# Patient Record
Sex: Female | Born: 1970
Health system: Southern US, Community
[De-identification: ages and names within clinical notes are randomized; demographics above are authoritative.]

## PROBLEM LIST (undated history)

## (undated) DIAGNOSIS — I10 Essential (primary) hypertension: Secondary | ICD-10-CM

## (undated) DIAGNOSIS — E119 Type 2 diabetes mellitus without complications: Secondary | ICD-10-CM

## (undated) DIAGNOSIS — K219 Gastro-esophageal reflux disease without esophagitis: Secondary | ICD-10-CM

## (undated) DIAGNOSIS — E785 Hyperlipidemia, unspecified: Secondary | ICD-10-CM

## (undated) DIAGNOSIS — J189 Pneumonia, unspecified organism: Secondary | ICD-10-CM

## (undated) DIAGNOSIS — G4730 Sleep apnea, unspecified: Secondary | ICD-10-CM

## (undated) DIAGNOSIS — M1990 Unspecified osteoarthritis, unspecified site: Secondary | ICD-10-CM

## (undated) HISTORY — DX: Pneumonia, unspecified organism: J18.9

## (undated) HISTORY — DX: Hyperlipidemia, unspecified: E78.5

## (undated) HISTORY — DX: Essential (primary) hypertension: I10

---

## 2006-03-16 ENCOUNTER — Ambulatory Visit (HOSPITAL_COMMUNITY): Admission: RE | Admit: 2006-03-16 | Discharge: 2006-03-16 | Payer: Self-pay | Admitting: Family Medicine

## 2008-04-07 ENCOUNTER — Ambulatory Visit (HOSPITAL_COMMUNITY): Admission: RE | Admit: 2008-04-07 | Discharge: 2008-04-07 | Payer: Self-pay | Admitting: Family Medicine

## 2008-05-02 HISTORY — PX: ABDOMINAL HYSTERECTOMY: SHX81

## 2008-11-10 ENCOUNTER — Other Ambulatory Visit: Admission: RE | Admit: 2008-11-10 | Discharge: 2008-11-10 | Payer: Self-pay | Admitting: Obstetrics & Gynecology

## 2009-01-21 ENCOUNTER — Encounter: Payer: Self-pay | Admitting: Obstetrics & Gynecology

## 2009-01-21 ENCOUNTER — Inpatient Hospital Stay (HOSPITAL_COMMUNITY): Admission: RE | Admit: 2009-01-21 | Discharge: 2009-01-22 | Payer: Self-pay | Admitting: Obstetrics & Gynecology

## 2010-05-23 ENCOUNTER — Encounter: Payer: Self-pay | Admitting: Internal Medicine

## 2010-08-06 LAB — URINALYSIS, ROUTINE W REFLEX MICROSCOPIC
Ketones, ur: NEGATIVE mg/dL
Protein, ur: NEGATIVE mg/dL
Urobilinogen, UA: 0.2 mg/dL (ref 0.0–1.0)

## 2010-08-06 LAB — COMPREHENSIVE METABOLIC PANEL
AST: 38 U/L — ABNORMAL HIGH (ref 0–37)
Alkaline Phosphatase: 69 U/L (ref 39–117)
BUN: 5 mg/dL — ABNORMAL LOW (ref 6–23)
Calcium: 10 mg/dL (ref 8.4–10.5)
Chloride: 109 mEq/L (ref 96–112)
GFR calc Af Amer: >60 mL/min (ref >60)
Potassium: 4.4 mEq/L (ref 3.5–5.1)
Sodium: 139 mEq/L (ref 135–145)
Total Bilirubin: 0.7 mg/dL (ref 0.3–1.2)
Total Protein: 7.6 g/dL (ref 6.0–8.3)

## 2010-08-06 LAB — TYPE AND SCREEN: ABO/RH(D): A POS

## 2010-08-06 LAB — CBC
HCT: 38.3 % (ref 36.0–46.0)
Hemoglobin: 13.1 g/dL (ref 12.0–15.0)
MCHC: 34.1 g/dL (ref 30.0–36.0)
MCV: 94.6 fL (ref 78.0–100.0)
Platelets: 275 10*3/uL (ref 150–400)
RBC: 4.05 MIL/uL (ref 3.87–5.11)
RDW: 14.9 % (ref 11.5–15.5)
RDW: 15.7 % — ABNORMAL HIGH (ref 11.5–15.5)

## 2010-08-06 LAB — HCG, QUANTITATIVE, PREGNANCY: hCG, Beta Chain, Quant, S: <2 m[IU]/mL (ref <5)

## 2010-08-06 LAB — DIFFERENTIAL
Eosinophils Relative: 2 % (ref 0–5)
Lymphocytes Relative: 27 % (ref 12–46)
Lymphs Abs: 3 10*3/uL (ref 0.7–4.0)

## 2011-12-27 DIAGNOSIS — R079 Chest pain, unspecified: Secondary | ICD-10-CM

## 2014-10-07 ENCOUNTER — Ambulatory Visit (INDEPENDENT_AMBULATORY_CARE_PROVIDER_SITE_OTHER): Payer: Medicaid Other | Admitting: Nurse Practitioner

## 2014-10-07 ENCOUNTER — Other Ambulatory Visit: Payer: Self-pay

## 2014-10-07 ENCOUNTER — Encounter: Payer: Self-pay | Admitting: Nurse Practitioner

## 2014-10-07 ENCOUNTER — Encounter (INDEPENDENT_AMBULATORY_CARE_PROVIDER_SITE_OTHER): Payer: Self-pay

## 2014-10-07 VITALS — BP 104/72 | HR 69 | Temp 97.5°F | Ht 65.0 in | Wt 190.6 lb

## 2014-10-07 DIAGNOSIS — K611 Rectal abscess: Secondary | ICD-10-CM | POA: Insufficient documentation

## 2014-10-07 DIAGNOSIS — K6289 Other specified diseases of anus and rectum: Secondary | ICD-10-CM | POA: Diagnosis not present

## 2014-10-07 DIAGNOSIS — R198 Other specified symptoms and signs involving the digestive system and abdomen: Secondary | ICD-10-CM | POA: Diagnosis not present

## 2014-10-07 NOTE — Assessment & Plan Note (Signed)
Patient with a bulky/full sensation in her rectum which is worse when sitting down. She saw her primary care for her symptoms, including rectal pain and hematochezia over the past year. She is a high risk patient for colorectal cancer as her mother was diagnosed at age 43 and passed away at age 15. Per PCP notes he performed a digital rectal exam and felt a boggy rectal mass. No rectal exam performed today due to the need for colonoscopy and rectal exam at that time. Given her presentation high-risk situation we'll proceed with an urgent colonoscopy.  Proceed with colonoscopy with Dr. Oneida Alar in the near future. The risks, benefits, and alternatives have been discussed in detail with the patient. They state understanding and desire to proceed.   Patient is not on any anticoagulants, anxiolytics, chronic pain medications, or antidepressants. She has a history of binge drinking once a week however has not had any alcohol in the past few years. We'll provide for 25 mg Phenergan before the procedure.

## 2014-10-07 NOTE — Assessment & Plan Note (Signed)
High risk patient for colorectal cancer due to family history. Her mother was diagnosed with colon cancer age 43 and passed away from the disease at age 63. The patient has had rectal pain for about the past year along with rectal bleeding, although the bleeding has subsided the pain is persistent. Given her high-risk situation, in clinical presentation we will proceed with a diagnostic colonoscopy in this high risk patient.  Proceed with colonoscopy with Dr. Oneida Alar in the near future. The risks, benefits, and alternatives have been discussed in detail with the patient. They state understanding and desire to proceed.   Patient is not on any anticoagulants, anxiolytics, chronic pain medications, or antidepressants. She has a history of binge drinking once a week however has not had any alcohol in the past few years. We'll provide for 25 mg Phenergan before the procedure.

## 2014-10-07 NOTE — Progress Notes (Signed)
CC'ED TO PCP 

## 2014-10-07 NOTE — Progress Notes (Signed)
Primary Care Physician:  Marline Backbone, FNP Primary Gastroenterologist:  Dr. Oneida Alar  Chief Complaint  Patient presents with  . Rectal Pain    HPI:   44 year old female presents on referral from PCP for rectal pain with palpable mass, family history of colorectal cancer. PCP notes reviewed. Last PCP office visit 09/19/2014 with noted rectal pain not related to DM, pressure when sitting or lying down. Digital rectal exam with palpable tender boggy mass and no stool in the vault. Stool for occult blood was negative.  Today she states she has ahd symptoms for about a year and include rectal bleeding and pain. The bleeding has subsided but pain persists. Bleeding was only on toilet tissue when wiping after a bowel movement. Pain is constant but worse with sitting. Denies fever, chills, unintentional weight loss, melena. Admits some LLQ abdominal pain which feels crampy. Pain is transient and recurrent. Denies N/V. Denies any additional upper or lower GI symptoms.   Past Medical History  Diagnosis Date  . Hypertension     Past Surgical History  Procedure Laterality Date  . Abdominal hysterectomy  2010    for heavy bleeding    Current Outpatient Prescriptions  Medication Sig Dispense Refill  . atenolol (TENORMIN) 25 MG tablet Take by mouth daily.    . hydrochlorothiazide (HYDRODIURIL) 25 MG tablet Take 25 mg by mouth daily.    Marland Kitchen ibuprofen (ADVIL,MOTRIN) 200 MG tablet Take 200 mg by mouth daily.    . Simethicone (PHAZYME PO) Take 1 capsule by mouth daily.     No current facility-administered medications for this visit.    Allergies as of 10/07/2014  . (No Known Allergies)    Family History  Problem Relation Age of Onset  . Colon cancer Mother 53    passed away age 34  . Breast cancer Maternal Aunt     currently treating    History   Social History  . Marital Status: Single    Spouse Name: N/A  . Number of Children: N/A  . Years of Education: N/A   Occupational  History  . Not on file.   Social History Main Topics  . Smoking status: Former Smoker    Quit date: 05/02/2012  . Smokeless tobacco: Never Used  . Alcohol Use: 0.0 oz/week    0 Standard drinks or equivalent per week     Comment: Last drink 2012; used to drink a case of beer every Friday  . Drug Use: No  . Sexual Activity: Not on file   Other Topics Concern  . Not on file   Social History Narrative  . No narrative on file    Review of Systems: General: Negative for anorexia, weight loss, fever, chills, fatigue, weakness. Eyes: Negative for vision changes.  ENT: Negative for hoarseness, difficulty swallowing. CV: Negative for chest pain, angina, palpitations, peripheral edema.  Respiratory: Negative for dyspnea at rest, cough, wheezing.  GI: See history of present illness. MS: Negative for joint pain, low back pain.  Derm: Negative for rash or itching.  Neuro: Negative for weakness, memory loss, confusion.  Psych: Negative for anxiety, depression.  Endo: Negative for unusual weight change.  Heme: Negative for bruising or bleeding. Allergy: Negative for rash or hives.    Physical Exam: BP 104/72 mmHg  Pulse 69  Temp(Src) 97.5 F (36.4 C)  Ht 5\' 5"  (1.651 m)  Wt 190 lb 9.6 oz (86.456 kg)  BMI 31.72 kg/m2 General:   Alert and oriented. Pleasant and  cooperative. Well-nourished and well-developed.  Head:  Normocephalic and atraumatic. Eyes:  Without icterus, sclera clear and conjunctiva pink.  Ears:  Normal auditory acuity. Cardiovascular:  S1, S2 present without murmurs appreciated. Normal pulses noted. Extremities without clubbing or edema. Respiratory:  Clear to auscultation bilaterally. No wheezes, rales, or rhonchi. No distress.  Gastrointestinal:  +BS, soft, and non-distended. Mild LLQ TTP. No HSM noted. No guarding or rebound. No masses appreciated.  Rectal:  Deferred  Skin:  Intact without significant lesions or rashes. Neurologic:  Alert and oriented x4;   grossly normal neurologically. Psych:  Alert and cooperative. Normal mood and affect. Heme/Lymph/Immune: No excessive bruising noted.    10/07/2014 11:42 AM

## 2014-10-07 NOTE — Patient Instructions (Signed)
1. We will schedule your procedure (colonoscopy) for you 2. Further recommendations to be based on the results of your procedure. 

## 2014-10-14 ENCOUNTER — Ambulatory Visit (HOSPITAL_COMMUNITY)
Admission: RE | Admit: 2014-10-14 | Discharge: 2014-10-14 | Disposition: A | Discharge status: Home / Self Care | Payer: Medicaid Other | Source: Ambulatory Visit | Attending: Gastroenterology | Admitting: Gastroenterology

## 2014-10-14 ENCOUNTER — Encounter (HOSPITAL_COMMUNITY): Payer: Self-pay | Admitting: *Deleted

## 2014-10-14 ENCOUNTER — Encounter (HOSPITAL_COMMUNITY)
Admission: RE | Disposition: A | Discharge status: Home / Self Care | Payer: Self-pay | Source: Ambulatory Visit | Attending: Gastroenterology

## 2014-10-14 DIAGNOSIS — Z87891 Personal history of nicotine dependence: Secondary | ICD-10-CM | POA: Diagnosis not present

## 2014-10-14 DIAGNOSIS — Z79899 Other long term (current) drug therapy: Secondary | ICD-10-CM | POA: Diagnosis not present

## 2014-10-14 DIAGNOSIS — K621 Rectal polyp: Secondary | ICD-10-CM | POA: Insufficient documentation

## 2014-10-14 DIAGNOSIS — K921 Melena: Secondary | ICD-10-CM

## 2014-10-14 DIAGNOSIS — I10 Essential (primary) hypertension: Secondary | ICD-10-CM | POA: Diagnosis not present

## 2014-10-14 DIAGNOSIS — K6289 Other specified diseases of anus and rectum: Secondary | ICD-10-CM

## 2014-10-14 DIAGNOSIS — M13851 Other specified arthritis, right hip: Secondary | ICD-10-CM | POA: Diagnosis not present

## 2014-10-14 DIAGNOSIS — M13861 Other specified arthritis, right knee: Secondary | ICD-10-CM | POA: Diagnosis not present

## 2014-10-14 DIAGNOSIS — K648 Other hemorrhoids: Secondary | ICD-10-CM | POA: Diagnosis not present

## 2014-10-14 DIAGNOSIS — K644 Residual hemorrhoidal skin tags: Secondary | ICD-10-CM | POA: Insufficient documentation

## 2014-10-14 DIAGNOSIS — K5730 Diverticulosis of large intestine without perforation or abscess without bleeding: Secondary | ICD-10-CM | POA: Diagnosis not present

## 2014-10-14 DIAGNOSIS — Z791 Long term (current) use of non-steroidal anti-inflammatories (NSAID): Secondary | ICD-10-CM | POA: Diagnosis not present

## 2014-10-14 DIAGNOSIS — Z800 Family history of malignant neoplasm of digestive organs: Secondary | ICD-10-CM | POA: Diagnosis not present

## 2014-10-14 DIAGNOSIS — K625 Hemorrhage of anus and rectum: Secondary | ICD-10-CM | POA: Diagnosis present

## 2014-10-14 DIAGNOSIS — M13832 Other specified arthritis, left wrist: Secondary | ICD-10-CM | POA: Diagnosis not present

## 2014-10-14 DIAGNOSIS — K649 Unspecified hemorrhoids: Secondary | ICD-10-CM | POA: Diagnosis not present

## 2014-10-14 DIAGNOSIS — K219 Gastro-esophageal reflux disease without esophagitis: Secondary | ICD-10-CM | POA: Diagnosis not present

## 2014-10-14 DIAGNOSIS — M13831 Other specified arthritis, right wrist: Secondary | ICD-10-CM | POA: Diagnosis not present

## 2014-10-14 DIAGNOSIS — M6289 Other specified disorders of muscle: Secondary | ICD-10-CM | POA: Insufficient documentation

## 2014-10-14 HISTORY — DX: Unspecified osteoarthritis, unspecified site: M19.90

## 2014-10-14 HISTORY — PX: COLONOSCOPY: SHX5424

## 2014-10-14 HISTORY — DX: Gastro-esophageal reflux disease without esophagitis: K21.9

## 2014-10-14 SURGERY — COLONOSCOPY
Anesthesia: Moderate Sedation

## 2014-10-14 MED ORDER — MEPERIDINE HCL 100 MG/ML IJ SOLN
INTRAMUSCULAR | Status: DC | PRN
  Administered 2014-10-14 (×2): 25 mg via INTRAVENOUS

## 2014-10-14 MED ORDER — STERILE WATER FOR IRRIGATION IR SOLN
Status: DC | PRN
  Administered 2014-10-14: 13:00:00

## 2014-10-14 MED ORDER — MEPERIDINE HCL 100 MG/ML IJ SOLN
INTRAMUSCULAR | Status: AC
  Filled 2014-10-14: qty 2

## 2014-10-14 MED ORDER — MIDAZOLAM HCL 5 MG/5ML IJ SOLN
INTRAMUSCULAR | Status: DC | PRN
  Administered 2014-10-14: 1 mg via INTRAVENOUS
  Administered 2014-10-14: 2 mg via INTRAVENOUS

## 2014-10-14 MED ORDER — MIDAZOLAM HCL 5 MG/5ML IJ SOLN
INTRAMUSCULAR | Status: AC
  Filled 2014-10-14: qty 10

## 2014-10-14 MED ORDER — PROMETHAZINE HCL 25 MG/ML IJ SOLN
25.0000 mg | Freq: Once | INTRAMUSCULAR | Status: AC
  Administered 2014-10-14: 25 mg via INTRAVENOUS

## 2014-10-14 MED ORDER — SODIUM CHLORIDE 0.9 % IJ SOLN
INTRAMUSCULAR | Status: AC
  Filled 2014-10-14: qty 3

## 2014-10-14 MED ORDER — SODIUM CHLORIDE 0.9 % IV SOLN
INTRAVENOUS | Status: DC
  Administered 2014-10-14: 1000 mL via INTRAVENOUS

## 2014-10-14 MED ORDER — PROMETHAZINE HCL 25 MG/ML IJ SOLN
INTRAMUSCULAR | Status: AC
  Filled 2014-10-14: qty 1

## 2014-10-14 NOTE — Discharge Instructions (Signed)
You had 1 small polyp removed FROM YOUR RECTUM. You have SMALL internal AND LARGE EXTERNAL hemorrhoids.   YOU NEED TO SEE A SURGEON TO DISCUSS HEMORRHOID SURGERY.  FOLLOW A HIGH FIBER DIET. AVOID ITEMS THAT CAUSE BLOATING & GAS. SEE INFO BELOW.  YOUR BIOPSY RESULTS WILL BE AVAILABLE IN MY CHART AFTER JUN 16 AND MY OFFICE WILL CONTACT YOU IN 10-14 DAYS WITH YOUR RESULTS.   Next colonoscopy in 5-10 years.    Colonoscopy Care After Read the instructions outlined below and refer to this sheet in the next week. These discharge instructions provide you with general information on caring for yourself after you leave the hospital. While your treatment has been planned according to the most current medical practices available, unavoidable complications occasionally occur. If you have any problems or questions after discharge, call DR. Chuong Casebeer, [phone].  ACTIVITY  You may resume your regular activity, but move at a slower pace for the next 24 hours.   Take frequent rest periods for the next 24 hours.   Walking will help get rid of the air and reduce the bloated feeling in your belly (abdomen).   No driving for 24 hours (because of the medicine (anesthesia) used during the test).   You may shower.   Do not sign any important legal documents or operate any machinery for 24 hours (because of the anesthesia used during the test).    NUTRITION  Drink plenty of fluids.   You may resume your normal diet as instructed by your doctor.   Begin with a light meal and progress to your normal diet. Heavy or fried foods are harder to digest and may make you feel sick to your stomach (nauseated).   Avoid alcoholic beverages for 24 hours or as instructed.    MEDICATIONS  You may resume your normal medications.   WHAT YOU CAN EXPECT TODAY  Some feelings of bloating in the abdomen.   Passage of more gas than usual.   Spotting of blood in your stool or on the toilet paper  .  IF YOU HAD  POLYPS REMOVED DURING THE COLONOSCOPY:  Eat a soft diet IF YOU HAVE NAUSEA, BLOATING, ABDOMINAL PAIN, OR VOMITING.    FINDING OUT THE RESULTS OF YOUR TEST Not all test results are available during your visit. DR. Oneida Alar WILL CALL YOU WITHIN 14 DAYS OF YOUR PROCEDUE WITH YOUR RESULTS. Do not assume everything is normal if you have not heard from DR. Kyce Ging, CALL HER OFFICE AT [phone].  SEEK IMMEDIATE MEDICAL ATTENTION AND CALL THE OFFICE: [phone] IF:  You have more than a spotting of blood in your stool.   Your belly is swollen (abdominal distention).   You are nauseated or vomiting.   You have a temperature over 101F.   You have abdominal pain or discomfort that is severe or gets worse throughout the day.   High-Fiber Diet A high-fiber diet changes your normal diet to include more whole grains, legumes, fruits, and vegetables. Changes in the diet involve replacing refined carbohydrates with unrefined foods. The calorie level of the diet is essentially unchanged. The Dietary Reference Intake (recommended amount) for adult males is 38 grams per day. For adult females, it is 25 grams per day. Pregnant and lactating women should consume 28 grams of fiber per day. Fiber is the intact part of a plant that is not broken down during digestion. Functional fiber is fiber that has been isolated from the plant to provide a beneficial effect in the  body. PURPOSE  Increase stool bulk.   Ease and regulate bowel movements.   Lower cholesterol.  REDUCE YOUR RISK OF COLON CANCER  INDICATIONS THAT YOU NEED MORE FIBER  Constipation and hemorrhoids.   Uncomplicated diverticulosis (intestine condition) and irritable bowel syndrome.   Weight management.   As a protective measure against hardening of the arteries (atherosclerosis), diabetes, and cancer.   GUIDELINES FOR INCREASING FIBER IN THE DIET  Start adding fiber to the diet slowly. A gradual increase of about 5 more grams (2  slices of whole-wheat bread, 2 servings of most fruits or vegetables, or 1 bowl of high-fiber cereal) per day is best. Too rapid an increase in fiber may result in constipation, flatulence, and bloating.   Drink enough water and fluids to keep your urine clear or pale yellow. Water, juice, or caffeine-free drinks are recommended. Not drinking enough fluid may cause constipation.   Eat a variety of high-fiber foods rather than one type of fiber.   Try to increase your intake of fiber through using high-fiber foods rather than fiber pills or supplements that contain small amounts of fiber.   The goal is to change the types of food eaten. Do not supplement your present diet with high-fiber foods, but replace foods in your present diet.   INCLUDE A VARIETY OF FIBER SOURCES  Replace refined and processed grains with whole grains, canned fruits with fresh fruits, and incorporate other fiber sources. White rice, white breads, and most bakery goods contain little or no fiber.   Brown whole-grain rice, buckwheat oats, and many fruits and vegetables are all good sources of fiber. These include: broccoli, Brussels sprouts, cabbage, cauliflower, beets, sweet potatoes, white potatoes (skin on), carrots, tomatoes, eggplant, squash, berries, fresh fruits, and dried fruits.   Cereals appear to be the richest source of fiber. Cereal fiber is found in whole grains and bran. Bran is the fiber-rich outer coat of cereal grain, which is largely removed in refining. In whole-grain cereals, the bran remains. In breakfast cereals, the largest amount of fiber is found in those with "bran" in their names. The fiber content is sometimes indicated on the label.   You may need to include additional fruits and vegetables each day.   In baking, for 1 cup white flour, you may use the following substitutions:   1 cup whole-wheat flour minus 2 tablespoons.   1/2 cup white flour plus 1/2 cup whole-wheat flour.   Polyps, Colon   A polyp is extra tissue that grows inside your body. Colon polyps grow in the large intestine. The large intestine, also called the colon, is part of your digestive system. It is a long, hollow tube at the end of your digestive tract where your body makes and stores stool. Most polyps are not dangerous. They are benign. This means they are not cancerous. But over time, some types of polyps can turn into cancer. Polyps that are smaller than a pea are usually not harmful. But larger polyps could someday become or may already be cancerous. To be safe, doctors remove all polyps and test them.    PREVENTION There is not one sure way to prevent polyps. You might be able to lower your risk of getting them if you:  Eat more fruits and vegetables and less fatty food.   Do not smoke.   Avoid alcohol.   Exercise every day.   Lose weight if you are overweight.   Eating more calcium and folate can also lower your  risk of getting polyps. Some foods that are rich in calcium are milk, cheese, and broccoli. Some foods that are rich in folate are chickpeas, kidney beans, and spinach.   Hemorrhoids Hemorrhoids are dilated (enlarged) veins around the rectum. Sometimes clots will form in the veins. This makes them swollen and painful. These are called thrombosed hemorrhoids. Causes of hemorrhoids include:  Constipation.   Straining to have a bowel movement.   HEAVY LIFTING HOME CARE INSTRUCTIONS  Eat a well balanced diet and drink 6 to 8 glasses of water every day to avoid constipation. You may also use a bulk laxative.   Avoid straining to have bowel movements.   Keep anal area dry and clean.   Do not use a donut shaped pillow or sit on the toilet for long periods. This increases blood pooling and pain.   Move your bowels when your body has the urge; this will require less straining and will decrease pain and pressure.

## 2014-10-14 NOTE — Op Note (Signed)
[facility] [address] [place], [address]   COLONOSCOPY PROCEDURE REPORT  PATIENT: Teresa Russell, Teresa Russell  MR#: [id number] BIRTHDATE: July 03, 1970 , 43  yrs. old GENDER: female ENDOSCOPIST: Danie Binder, MD REFERRED BY: PROCEDURE DATE:  October 30, 2014 PROCEDURE:   Colonoscopy with cold biopsy polypectomy INDICATIONS:anal bleeding. MEDICATIONS: Promethazine (Phenergan) 25 mg IV, Demerol 50 mg IV, and Versed 3 mg IV  DESCRIPTION OF PROCEDURE:    Physical exam was performed.  Informed consent was obtained from the patient after explaining the benefits, risks, and alternatives to procedure.  The patient was connected to monitor and placed in left lateral position. Continuous oxygen was provided by nasal cannula and IV medicine administered through an indwelling cannula.  After administration of sedation and rectal exam, the patients rectum was intubated and the EC-3890Li ([id number])  colonoscope was advanced under direct visualization to the ileum.  The scope was removed slowly by carefully examining the color, texture, anatomy, and integrity mucosa on the way out.  The patient was recovered in endoscopy and discharged home in satisfactory condition. Estimated blood loss is zero unless otherwise noted in this procedure report.    COLON FINDINGS: The examined terminal ileum appeared to be normal. , The colonic mucosa appeared normal.  , The colon was redundant. , There was mild diverticulosis noted in the sigmoid colon with associated muscular hypertrophy.  , Small internal hemorrhoids were found.  , and Large external hemorrhoids were found.    ONE 3 MM SESSILE RECTAL POLYP REMOVED VIA COLD FORCEPS.  PREP QUALITY: excellent.  CECAL W/D TIME: 10       minutes COMPLICATIONS: None  ENDOSCOPIC IMPRESSION: 1.   RECTAL PAIN/BLEEDING DUE TO HEMORRHOIDS 2.   MILD SIGMOID COLON DIVERTICULOSIS 3.   The LEFT colon IS SLIGHTLY redundant 4.   Small internal hemorrhoids 5.    Large external hemorrhoids  RECOMMENDATIONS: SEE A SURGEON TO DISCUSS HEMORRHOID SURGERY. FOLLOW A HIGH FIBER DIET.  AVOID ITEMS THAT CAUSE BLOATING & GAS. AWAIT BIOPSY RESULTS. Next colonoscopy in 5-10 years.   _______________________________ eSignedDanie Binder, MD 2014-10-30 1:13 PM   CPT CODES: ICD CODES:  The ICD and CPT codes recommended by this software are interpretations from the data that the clinical staff has captured with the software.  The verification of the translation of this report to the ICD and CPT codes and modifiers is the sole responsibility of the health care institution and practicing physician where this report was generated.  [employer] will not be held responsible for the validity of the ICD and CPT codes included on this report.  AMA assumes no liability for data contained or not contained herein. CPT is a Designer, television/film set of the [employer].

## 2014-10-14 NOTE — Progress Notes (Signed)
REVIEWED-NO ADDITIONAL RECOMMENDATIONS. 

## 2014-10-14 NOTE — H&P (Signed)
  Primary Care Physician:  Edwinna Areola Deliah Goody, FNP Primary Gastroenterologist:  Dr. Oneida Alar  Pre-Procedure History & Physical: HPI:  Teresa Russell is a 44 y.o. female here for RECTAL BLEEDING/PAIN.  Past Medical History  Diagnosis Date  . Hypertension   . GERD (gastroesophageal reflux disease)   . Arthritis     wrists and right hip, right knee    Past Surgical History  Procedure Laterality Date  . Abdominal hysterectomy  2010    for heavy bleeding    Prior to Admission medications   Medication Sig Start Date End Date Taking? Authorizing Provider  atenolol (TENORMIN) 50 MG tablet Take 50 mg by mouth daily.   Yes Historical Provider, MD  hydrochlorothiazide (HYDRODIURIL) 25 MG tablet Take 25 mg by mouth daily.   Yes Historical Provider, MD  ibuprofen (ADVIL,MOTRIN) 200 MG tablet Take 400 mg by mouth daily.    Yes Historical Provider, MD  Multiple Vitamins-Minerals (EMERGEN-C VITAMIN C PO) Take 1 packet by mouth daily.   Yes Historical Provider, MD  Simethicone (PHAZYME PO) Take 1 capsule by mouth daily.   Yes Historical Provider, MD    Allergies as of 10/07/2014  . (No Known Allergies)    Family History  Problem Relation Age of Onset  . Colon cancer Mother 106    passed away age 55  . Breast cancer Maternal Aunt     currently treating    History   Social History  . Marital Status: Single    Spouse Name: N/A  . Number of Children: N/A  . Years of Education: N/A   Occupational History  . Not on file.   Social History Main Topics  . Smoking status: Former Smoker    Quit date: 05/02/2012  . Smokeless tobacco: Never Used  . Alcohol Use: 0.0 oz/week    0 Standard drinks or equivalent per week     Comment: Last drink 2012; used to drink a case of beer every Friday  . Drug Use: No  . Sexual Activity: Yes   Other Topics Concern  . Not on file   Social History Narrative    Review of Systems: See HPI, otherwise negative ROS   Physical Exam: BP 121/71 mmHg   Pulse 72  Temp(Src) 98.3 F (36.8 C) (Oral)  Resp 18  Ht 5\' 5"  (1.651 m)  Wt 190 lb (86.183 kg)  BMI 31.62 kg/m2  SpO2 100% General:   Alert,  pleasant and cooperative in NAD Head:  Normocephalic and atraumatic. Neck:  Supple; Lungs:  Clear throughout to auscultation.    Heart:  Regular rate and rhythm. Abdomen:  Soft, nontender and nondistended. Normal bowel sounds, without guarding, and without rebound.   Neurologic:  Alert and  oriented x4;  grossly normal neurologically.  Impression/Plan:    PLAN:  1. TCS TODAY

## 2014-10-16 ENCOUNTER — Encounter (HOSPITAL_COMMUNITY): Payer: Self-pay | Admitting: Gastroenterology

## 2014-10-27 ENCOUNTER — Telehealth: Payer: Self-pay | Admitting: Gastroenterology

## 2014-10-27 NOTE — Telephone Encounter (Signed)
Please call pt. She had HYPERPLASTIC POLYPS removed.   SEE A SURGEON TO DISCUSS HEMORRHOID SURGERY.  FOLLOW A HIGH FIBER DIET. AVOID ITEMS THAT CAUSE BLOATING & GAS.   Next colonoscopy in 5 years BECAUSE HER MOTHER HAD COLON CANCER.

## 2014-10-28 NOTE — Telephone Encounter (Signed)
Pt is aware of results. She will find her own surgeon and let us know she we can sent them any records.

## 2014-10-29 NOTE — Telephone Encounter (Signed)
Pt is on recall list for TCS 5 years

## 2017-03-07 ENCOUNTER — Other Ambulatory Visit (HOSPITAL_COMMUNITY): Payer: Self-pay | Admitting: *Deleted

## 2017-03-07 DIAGNOSIS — Z1239 Encounter for other screening for malignant neoplasm of breast: Secondary | ICD-10-CM

## 2017-03-22 ENCOUNTER — Ambulatory Visit (HOSPITAL_COMMUNITY): Payer: Self-pay

## 2018-06-05 ENCOUNTER — Other Ambulatory Visit (HOSPITAL_COMMUNITY): Payer: Self-pay | Admitting: *Deleted

## 2018-06-05 DIAGNOSIS — Z1231 Encounter for screening mammogram for malignant neoplasm of breast: Secondary | ICD-10-CM

## 2018-11-29 ENCOUNTER — Other Ambulatory Visit: Payer: Self-pay

## 2018-11-29 DIAGNOSIS — Z20822 Contact with and (suspected) exposure to covid-19: Secondary | ICD-10-CM

## 2018-12-02 LAB — NOVEL CORONAVIRUS, NAA: SARS-CoV-2, NAA: NOT DETECTED

## 2018-12-06 ENCOUNTER — Telehealth: Payer: Self-pay | Admitting: Family

## 2018-12-06 NOTE — Telephone Encounter (Signed)
Pt returning call for covid results; negative. Pt verbalizes understanding. 

## 2019-02-04 ENCOUNTER — Emergency Department (HOSPITAL_COMMUNITY): Payer: Self-pay

## 2019-02-04 ENCOUNTER — Other Ambulatory Visit: Payer: Self-pay

## 2019-02-04 ENCOUNTER — Encounter (HOSPITAL_COMMUNITY): Payer: Self-pay

## 2019-02-04 ENCOUNTER — Emergency Department (HOSPITAL_COMMUNITY)
Admission: EM | Admit: 2019-02-04 | Discharge: 2019-02-04 | Disposition: A | Discharge status: Home / Self Care | Payer: Self-pay | Attending: Emergency Medicine | Admitting: Emergency Medicine

## 2019-02-04 DIAGNOSIS — R079 Chest pain, unspecified: Secondary | ICD-10-CM

## 2019-02-04 DIAGNOSIS — Z87891 Personal history of nicotine dependence: Secondary | ICD-10-CM | POA: Insufficient documentation

## 2019-02-04 DIAGNOSIS — Z79899 Other long term (current) drug therapy: Secondary | ICD-10-CM | POA: Insufficient documentation

## 2019-02-04 DIAGNOSIS — R0789 Other chest pain: Secondary | ICD-10-CM | POA: Insufficient documentation

## 2019-02-04 DIAGNOSIS — I10 Essential (primary) hypertension: Secondary | ICD-10-CM | POA: Insufficient documentation

## 2019-02-04 LAB — CBC
HCT: 41.1 % (ref 36.0–46.0)
Hemoglobin: 14.4 g/dL (ref 12.0–15.0)
MCH: 29.9 pg (ref 26.0–34.0)
MCHC: 35 g/dL (ref 30.0–36.0)
MCV: 85.4 fL (ref 80.0–100.0)
Platelets: 344 10*3/uL (ref 150–400)
RBC: 4.81 MIL/uL (ref 3.87–5.11)
RDW: 13.1 % (ref 11.5–15.5)
WBC: 7.1 10*3/uL (ref 4.0–10.5)
nRBC: 0 % (ref 0.0–0.2)

## 2019-02-04 LAB — BASIC METABOLIC PANEL
Anion gap: 12 (ref 5–15)
BUN: 12 mg/dL (ref 6–20)
CO2: 28 mmol/L (ref 22–32)
Calcium: 10.3 mg/dL (ref 8.9–10.3)
Chloride: 99 mmol/L (ref 98–111)
Creatinine, Ser: 0.57 mg/dL (ref 0.44–1.00)
GFR calc Af Amer: >60 mL/min (ref >60)
GFR calc non Af Amer: >60 mL/min (ref >60)
Glucose, Bld: 121 mg/dL — ABNORMAL HIGH (ref 70–99)
Potassium: 3 mmol/L — ABNORMAL LOW (ref 3.5–5.1)
Sodium: 139 mmol/L (ref 135–145)

## 2019-02-04 LAB — TROPONIN I (HIGH SENSITIVITY)
Troponin I (High Sensitivity): <2 ng/L (ref <18)
Troponin I (High Sensitivity): <2 ng/L (ref <18)

## 2019-02-04 MED ORDER — POTASSIUM CHLORIDE CRYS ER 20 MEQ PO TBCR
40.0000 meq | EXTENDED_RELEASE_TABLET | Freq: Once | ORAL | Status: AC
  Administered 2019-02-04: 40 meq via ORAL
  Filled 2019-02-04: qty 2

## 2019-02-04 MED ORDER — OMEPRAZOLE 20 MG PO CPDR: 20.0000 mg | DELAYED_RELEASE_CAPSULE | Freq: Every day | ORAL | 0 refills | Status: DC

## 2019-02-04 MED ORDER — SODIUM CHLORIDE 0.9% FLUSH: 3.0000 mL | Freq: Once | INTRAVENOUS | Status: DC

## 2019-02-04 MED ORDER — SUCRALFATE 1 GM/10ML PO SUSP: 1.0000 g | Freq: Three times a day (TID) | ORAL | 0 refills | Status: DC

## 2019-02-04 MED ORDER — POTASSIUM CHLORIDE CRYS ER 20 MEQ PO TBCR: 20.0000 meq | EXTENDED_RELEASE_TABLET | Freq: Every day | ORAL | 0 refills | Status: DC

## 2019-02-04 NOTE — Discharge Instructions (Addendum)
You were seen in the emergency department today for chest pain. Your work-up in the emergency department has been overall reassuring. Your labs have been fairly normal and or similar to previous blood work you have had done with the exception that your potassium was low, we have given you a prescription for a potassium supplement as well as information to include this in your diet.. Your EKG and the enzyme we use to check your heart did not show an acute heart attack at this time. Your chest x-ray was normal.   We are sending you home with prescriptions to help with possible reflux including omeprazole to take daily in the morning prior to breakfast and Carafate to take prior to meals and prior to bedtime.  Please try these with diet recommendations.  We have prescribed you new medication(s) today. Discuss the medications prescribed today with your pharmacist as they can have adverse effects and interactions with your other medicines including over the counter and prescribed medications. Seek medical evaluation if you start to experience new or abnormal symptoms after taking one of these medicines, seek care immediately if you start to experience difficulty breathing, feeling of your throat closing, facial swelling, or rash as these could be indications of a more serious allergic reaction   We would like you to follow up closely with your primary care provider and/or the cardiologist provided in your discharge instructions within 1-3 days.  Please have your potassium rechecked by your primary care provider as well.  Return to the ER immediately should you experience any new or worsening symptoms including but not limited to return of pain, worsened pain, vomiting, shortness of breath, dizziness, lightheadedness, passing out, or any other concerns that you may have.

## 2019-02-04 NOTE — ED Triage Notes (Signed)
Pt presents to ED with complaints of generalized chest pain since yesterday. Pt also c/o dizziness.

## 2019-02-04 NOTE — ED Provider Notes (Addendum)
[facility] EMERGENCY DEPARTMENT Provider Note   CSN: [id number] Arrival date & time: 02/04/19  1539     History   Chief Complaint Chief Complaint  Patient presents with  . Chest Pain    HPI Teresa Russell is a 48 y.o. female with a history of GERD, hypertension, and prior abdominal hysterectomy who presents to the emergency department with complaints of intermittent chest pain since yesterday.  Patient states that shortly after eating she developed generalized chest discomfort described as sharp/tightness.  Symptoms seem to be aggravated by swallowing and eating sometimes, no specific alleviating factors.  Current symptoms are mild.  No intervention prior to arrival.  She felt a bit lightheaded with the discomfort yesterday but not currently.  She called her PCP to discuss possibly prescribing something for acid reflux and ultimately was told to come to the ED. Denies nausea, vomiting, diaphoresis, dyspnea, syncope, dizziness like the room spinning, leg pain/swelling, hemoptysis, recent surgery/trauma, recent long travel, hormone use, personal hx of cancer, or hx of DVT/PE. Denies early family hx of CAD.   HPI  Past Medical History:  Diagnosis Date  . Arthritis    wrists and right hip, right knee  . GERD (gastroesophageal reflux disease)   . Hypertension     Patient Active Problem List   Diagnosis Date Noted  . Rectal pain 10/07/2014  . Rectal mass 10/07/2014    Past Surgical History:  Procedure Laterality Date  . ABDOMINAL HYSTERECTOMY  2010   for heavy bleeding  . COLONOSCOPY N/A 10/14/2014   Procedure: COLONOSCOPY;  Surgeon: Danie Binder, MD;  Location: [facility];  Service: Endoscopy;  Laterality: N/A;  115 - moved to 11:15 - office notified pt     OB History   No obstetric history on file.      Home Medications    Prior to Admission medications   Medication Sig Start Date End Date Taking? Authorizing Provider  atenolol (TENORMIN) 50 MG tablet Take 50 mg  by mouth daily.    [provider]  hydrochlorothiazide (HYDRODIURIL) 25 MG tablet Take 25 mg by mouth daily.    [provider]  ibuprofen (ADVIL,MOTRIN) 200 MG tablet Take 400 mg by mouth daily.     [provider]  Multiple Vitamins-Minerals (EMERGEN-C VITAMIN C PO) Take 1 packet by mouth daily.    [provider]  Simethicone (PHAZYME PO) Take 1 capsule by mouth daily.    [provider]    Family History Family History  Problem Relation Age of Onset  . Colon cancer Mother 59       passed away age 69  . Breast cancer Maternal Aunt        currently treating    Social History Social History   Tobacco Use  . Smoking status: Former Smoker    Quit date: 05/02/2012    Years since quitting: 6.7  . Smokeless tobacco: Never Used  Substance Use Topics  . Alcohol use: Yes    Alcohol/week: 0.0 standard drinks    Comment: Last drink 2012; used to drink a case of beer every Friday  . Drug use: No     Allergies   Wellbutrin [bupropion]   Review of Systems Review of Systems  Constitutional: Negative for chills and fever.  HENT: Negative for congestion, ear pain and sore throat.   Respiratory: Negative for cough and shortness of breath.   Cardiovascular: Positive for chest pain. Negative for palpitations and leg swelling.  Gastrointestinal:  Negative for abdominal pain, nausea and vomiting.  Neurological: Positive for light-headedness (resolved @ present). Negative for dizziness, syncope, weakness, numbness and headaches.  All other systems reviewed and are negative.    Physical Exam Updated Vital Signs BP 123/90 (BP Location: Right Arm)   Pulse 77   Temp 98.1 F (36.7 C) (Oral)   Resp 18   Ht 5' 5.5" (1.664 m)   Wt 88.5 kg   SpO2 99%   BMI 31.96 kg/m   Physical Exam Vitals signs and nursing note reviewed.  Constitutional:      General: She is not in acute distress.    Appearance: She is well-developed. She is not  toxic-appearing.  HENT:     Head: Normocephalic and atraumatic.  Eyes:     General:        Right eye: No discharge.        Left eye: No discharge.     Conjunctiva/sclera: Conjunctivae normal.  Neck:     Musculoskeletal: Neck supple.  Cardiovascular:     Rate and Rhythm: Normal rate and regular rhythm.  Pulmonary:     Effort: Pulmonary effort is normal. No respiratory distress.     Breath sounds: Normal breath sounds. No wheezing, rhonchi or rales.  Chest:     Chest wall: Tenderness (mild anterior chest wall) present.  Abdominal:     General: There is no distension.     Palpations: Abdomen is soft.     Tenderness: There is no abdominal tenderness. There is no guarding or rebound. Negative signs include Murphy's sign and McBurney's sign.  Skin:    General: Skin is warm and dry.     Findings: No rash.  Neurological:     Mental Status: She is alert.     Comments: Clear speech.   Psychiatric:        Behavior: Behavior normal.    ED Treatments / Results  Labs (all labs ordered are listed, but only abnormal results are displayed) Labs Reviewed  BASIC METABOLIC PANEL - Abnormal; Notable for the following components:      Result Value   Potassium 3.0 (*)    Glucose, Bld 121 (*)    All other components within normal limits  CBC  TROPONIN I (HIGH SENSITIVITY)  TROPONIN I (HIGH SENSITIVITY)    EKG EKG Interpretation  Date/Time:  Monday February 04 2019 16:00:31 EDT Ventricular Rate:  75 PR Interval:  162 QRS Duration: 86 QT Interval:  400 QTC Calculation: 446 R Axis:   20 Text Interpretation:  Sinus rhythm with marked sinus arrhythmia Otherwise normal ECG Confirmed by Gerlene Fee [phone]) on 02/04/2019 9:17:47 PM   Radiology Dg Chest 2 View  Result Date: 02/04/2019 CLINICAL DATA:  Chest pain EXAM: CHEST - 2 VIEW COMPARISON:  03/30/2017 FINDINGS: No focal opacity or pleural effusion. Stable cardiomediastinal silhouette with right-sided aortic arch. No pneumothorax.  IMPRESSION: No active cardiopulmonary disease. Electronically Signed   By: Donavan Foil M.D.   On: 02/04/2019 19:19    Procedures Procedures (including critical care time)  Medications Ordered in ED Medications  sodium chloride flush (NS) 0.9 % injection 3 mL (3 mLs Intravenous Not Given 02/04/19 2040)  potassium chloride SA (KLOR-CON) CR tablet 40 mEq (40 mEq Oral Given 02/04/19 2108)     Initial Impression / Assessment and Plan / ED Course  I have reviewed the triage vital signs and the nursing notes.  Pertinent labs & imaging results that were available during my care of the patient  were reviewed by me and considered in my medical decision making (see chart for details).    Patient presents to the emergency department with complaints of intermittent chest pain since yesterday. Patient nontoxic appearing, in no apparent distress, vitals without significant abnormality, BP mildly elevated- doubt HTN emergency. Fairly benign physical exam, some discomfort w/ anterior chest wall palpation. DDX: ACS, pulmonary embolism, dissection, pneumothorax,pneumonia,, anemia,  MSK, GERD, anxiety. Work-up in the ER reviewed:  CBC: No anemia or leukocytosis BMP: Hypokalemia and mild hyperglycemia otherwise unremarkable.  Troponin: < 2 x 2.  EKG: No STEMI CXR:  Negative, without infiltrate, effusion, pneumothorax, or fracture/dislocation.   Heart pathway score 2, EKG without obvious ischemia, delta troponin negative, doubt ACS. Patient is low risk wells, PERC negative, doubt pulmonary embolism. Pain is not a tearing sensation, symmetric pulses, no widening of mediastinum on CXR, doubt dissection. Abdomen nontender w/o peritoneal signs- do not suspect acuter surgical abdominal process. Unclear definitive etiology- possibly GERD, will trial PPI & carafate with diet recommendations. Will given potassium supplement- suspect secondary to hydrochlorothiazide use without potassium supplementation.  Patient has  appeared hemodynamically stable throughout ER visit and appears safe for discharge with close PCP/cardiology follow up. I discussed results, treatment plan, need for PCP follow-up, and return precautions with the patient. Provided opportunity for questions, patient confirmed understanding and is in agreement with plan.   Findings and plan of care discussed with supervising physician Dr. Sedonia Small who is in agreement.    Final Clinical Impressions(s) / ED Diagnoses   Final diagnoses:  Chest pain, unspecified type    ED Discharge Orders         Ordered    potassium chloride SA (KLOR-CON) 20 MEQ tablet  Daily     02/04/19 2120    omeprazole (PRILOSEC) 20 MG capsule  Daily     02/04/19 2120    sucralfate (CARAFATE) 1 GM/10ML suspension  3 times daily with meals & bedtime     02/04/19 2120           Amaryllis Dyke, PA-C 02/04/19 2121    Amaryllis Dyke, PA-C 02/04/19 2136    Maudie Flakes, MD 02/05/19 2246

## 2019-05-08 DIAGNOSIS — E669 Obesity, unspecified: Secondary | ICD-10-CM | POA: Diagnosis not present

## 2019-05-08 DIAGNOSIS — I10 Essential (primary) hypertension: Secondary | ICD-10-CM | POA: Diagnosis not present

## 2019-05-08 DIAGNOSIS — E785 Hyperlipidemia, unspecified: Secondary | ICD-10-CM | POA: Diagnosis not present

## 2019-07-18 ENCOUNTER — Ambulatory Visit (HOSPITAL_COMMUNITY)
Admission: EM | Admit: 2019-07-18 | Discharge: 2019-07-18 | Disposition: A | Discharge status: Home / Self Care | Payer: Federal, State, Local not specified - PPO | Attending: Family Medicine | Admitting: Family Medicine

## 2019-07-18 ENCOUNTER — Encounter (HOSPITAL_COMMUNITY): Payer: Self-pay

## 2019-07-18 ENCOUNTER — Other Ambulatory Visit: Payer: Self-pay

## 2019-07-18 DIAGNOSIS — Z87891 Personal history of nicotine dependence: Secondary | ICD-10-CM | POA: Insufficient documentation

## 2019-07-18 DIAGNOSIS — Z20822 Contact with and (suspected) exposure to covid-19: Secondary | ICD-10-CM | POA: Insufficient documentation

## 2019-07-18 DIAGNOSIS — R05 Cough: Secondary | ICD-10-CM | POA: Insufficient documentation

## 2019-07-18 DIAGNOSIS — I10 Essential (primary) hypertension: Secondary | ICD-10-CM | POA: Diagnosis not present

## 2019-07-18 DIAGNOSIS — R059 Cough, unspecified: Secondary | ICD-10-CM

## 2019-07-18 MED ORDER — HYDROCODONE-HOMATROPINE 5-1.5 MG/5ML PO SYRP: 5.0000 mL | ORAL_SOLUTION | Freq: Four times a day (QID) | ORAL | 0 refills | Status: DC | PRN

## 2019-07-18 NOTE — ED Triage Notes (Signed)
Pt state she has a cough that started this morning. Pt states the cough it's getting worst.

## 2019-07-18 NOTE — Discharge Instructions (Addendum)
Be aware, your cough medication may cause drowsiness. Please do not drive, operate heavy machinery or make important decisions while on this medication, it can cloud your judgement.  

## 2019-07-18 NOTE — ED Provider Notes (Signed)
[place]   [id number] 07/18/19 Arrival Time: [place]:  1. Cough     COVID-19 testing sent. See letter/work note on file for self-isolation guidelines.  Meds ordered this encounter  Medications  . HYDROcodone-homatropine (HYCODAN) 5-1.5 MG/5ML syrup    Sig: Take 5 mLs by mouth every 6 (six) hours as needed for cough.    Dispense:  90 mL    Refill:  0     Follow-up Information    [facility].   Specialty: Urgent Care Why: If worsening or failing to improve as anticipated. Contact information: [address] [place] [phone]           Discharge Instructions     Be aware, your cough medication may cause drowsiness. Please do not drive, operate heavy machinery or make important decisions while on this medication, it can cloud your judgement.       Reviewed expectations re: course of current medical issues. Questions answered. Outlined signs and symptoms indicating need for more acute intervention. Understanding verbalized. After Visit Summary given.   SUBJECTIVE: History from: patient. Teresa Russell is a 49 y.o. female who reports waking this morning with persistent dry cough. Otherwise feeling well. No SOB or wheezing. Afebrile. Works at [employer]. Known COVID-19 contact: questions; deals with many customers each day. Recent travel: none. Denies: runny nose, congestion, sore throat and headache. Normal PO intake without n/v/d.    OBJECTIVE:  Vitals:   07/18/19 1842 07/18/19 1846  BP:  132/81  Pulse:  85  Resp:  18  Temp:  98.9 F (37.2 C)  TempSrc:  Oral  SpO2:  100%  Weight: 88.5 kg     General appearance: alert; no distress Eyes: PERRLA; EOMI; conjunctiva normal HENT: [place]; AT; nasal mucosa normal; oral mucosa normal Neck: supple  Lungs: CTAB; speaks full sentences without difficulty; unlabored; active dry cough Extremities: no edema Skin: warm  and dry Neurologic: normal gait Psychological: alert and cooperative; normal mood and affect  Labs:  Labs Reviewed  SARS CORONAVIRUS 2 (TAT 6-24 HRS)     Allergies  Allergen Reactions  . Wellbutrin [Bupropion] Other (See Comments)    Spinal pain    Past Medical History:  Diagnosis Date  . Arthritis    wrists and right hip, right knee  . GERD (gastroesophageal reflux disease)   . Hypertension    Social History   Socioeconomic History  . Marital status: Single    Spouse name: Not on file  . Number of children: Not on file  . Years of education: Not on file  . Highest education level: Not on file  Occupational History  . Not on file  Tobacco Use  . Smoking status: Former Smoker    Quit date: 05/02/2012    Years since quitting: 7.2  . Smokeless tobacco: Never Used  Substance and Sexual Activity  . Alcohol use: Yes    Alcohol/week: 0.0 standard drinks    Comment: Last drink 2012; used to drink a case of beer every Friday  . Drug use: No  . Sexual activity: Yes  Other Topics Concern  . Not on file  Social History Narrative  . Not on file   Social Determinants of Health   Financial Resource Strain:   . Difficulty of Paying Living Expenses:   Food Insecurity:   . Worried About Charity fundraiser in the Last Year:   . [employer]  of Food in the Last Year:   Transportation Needs:   . Film/video editor (Medical):   Marland Kitchen Lack of Transportation (Non-Medical):   Physical Activity:   . Days of Exercise per Week:   . Minutes of Exercise per Session:   Stress:   . Feeling of Stress :   Social Connections:   . Frequency of Communication with Friends and Family:   . Frequency of Social Gatherings with Friends and Family:   . Attends Religious Services:   . Active Member of Clubs or Organizations:   . Attends Archivist Meetings:   Marland Kitchen Marital Status:   Intimate Partner Violence:   . Fear of Current or Ex-Partner:   . Emotionally Abused:   Marland Kitchen Physically  Abused:   . Sexually Abused:    Family History  Problem Relation Age of Onset  . Colon cancer Mother 33       passed away age 16  . Breast cancer Maternal Aunt        currently treating   Past Surgical History:  Procedure Laterality Date  . ABDOMINAL HYSTERECTOMY  2010   for heavy bleeding  . COLONOSCOPY N/A 10/14/2014   Procedure: COLONOSCOPY;  Surgeon: Danie Binder, MD;  Location: [facility];  Service: Endoscopy;  Laterality: N/A;  115 - moved to 11:15 - office notified pt     Vanessa Kick, MD 07/18/19 [phone]

## 2019-07-19 LAB — SARS CORONAVIRUS 2 (TAT 6-24 HRS): SARS Coronavirus 2: NEGATIVE

## 2019-07-25 ENCOUNTER — Ambulatory Visit (HOSPITAL_COMMUNITY)
Admission: EM | Admit: 2019-07-25 | Discharge: 2019-07-25 | Discharge status: Absent without leave | Payer: Federal, State, Local not specified - PPO

## 2019-07-26 DIAGNOSIS — J189 Pneumonia, unspecified organism: Secondary | ICD-10-CM | POA: Diagnosis not present

## 2019-07-26 DIAGNOSIS — R05 Cough: Secondary | ICD-10-CM | POA: Diagnosis not present

## 2019-08-03 DIAGNOSIS — R05 Cough: Secondary | ICD-10-CM | POA: Diagnosis not present

## 2019-08-03 DIAGNOSIS — J189 Pneumonia, unspecified organism: Secondary | ICD-10-CM | POA: Diagnosis not present

## 2019-08-03 DIAGNOSIS — Z8701 Personal history of pneumonia (recurrent): Secondary | ICD-10-CM | POA: Diagnosis not present

## 2019-08-03 DIAGNOSIS — R0602 Shortness of breath: Secondary | ICD-10-CM | POA: Diagnosis not present

## 2019-08-17 DIAGNOSIS — I10 Essential (primary) hypertension: Secondary | ICD-10-CM | POA: Diagnosis not present

## 2019-09-11 DIAGNOSIS — Z136 Encounter for screening for cardiovascular disorders: Secondary | ICD-10-CM | POA: Diagnosis not present

## 2019-09-11 DIAGNOSIS — Z7289 Other problems related to lifestyle: Secondary | ICD-10-CM | POA: Diagnosis not present

## 2019-09-11 DIAGNOSIS — Z0000 Encounter for general adult medical examination without abnormal findings: Secondary | ICD-10-CM | POA: Diagnosis not present

## 2019-09-11 DIAGNOSIS — I10 Essential (primary) hypertension: Secondary | ICD-10-CM | POA: Diagnosis not present

## 2019-09-11 DIAGNOSIS — Z13220 Encounter for screening for lipoid disorders: Secondary | ICD-10-CM | POA: Diagnosis not present

## 2019-09-11 DIAGNOSIS — Z7689 Persons encountering health services in other specified circumstances: Secondary | ICD-10-CM | POA: Diagnosis not present

## 2019-09-11 DIAGNOSIS — R7302 Impaired glucose tolerance (oral): Secondary | ICD-10-CM | POA: Diagnosis not present

## 2019-09-18 ENCOUNTER — Encounter: Payer: Self-pay | Admitting: Gastroenterology

## 2019-09-18 DIAGNOSIS — Z1211 Encounter for screening for malignant neoplasm of colon: Secondary | ICD-10-CM | POA: Diagnosis not present

## 2019-09-18 DIAGNOSIS — L720 Epidermal cyst: Secondary | ICD-10-CM | POA: Diagnosis not present

## 2019-09-18 DIAGNOSIS — L723 Sebaceous cyst: Secondary | ICD-10-CM | POA: Diagnosis not present

## 2019-09-18 DIAGNOSIS — Z800 Family history of malignant neoplasm of digestive organs: Secondary | ICD-10-CM | POA: Diagnosis not present

## 2019-09-23 ENCOUNTER — Ambulatory Visit: Payer: Federal, State, Local not specified - PPO | Attending: Internal Medicine

## 2019-09-23 DIAGNOSIS — Z20822 Contact with and (suspected) exposure to covid-19: Secondary | ICD-10-CM

## 2019-09-24 LAB — NOVEL CORONAVIRUS, NAA: SARS-CoV-2, NAA: NOT DETECTED

## 2019-09-24 LAB — SARS-COV-2, NAA 2 DAY TAT

## 2019-09-25 DIAGNOSIS — L729 Follicular cyst of the skin and subcutaneous tissue, unspecified: Secondary | ICD-10-CM | POA: Diagnosis not present

## 2019-09-25 DIAGNOSIS — L723 Sebaceous cyst: Secondary | ICD-10-CM | POA: Diagnosis not present

## 2019-09-25 DIAGNOSIS — L720 Epidermal cyst: Secondary | ICD-10-CM | POA: Diagnosis not present

## 2019-09-25 DIAGNOSIS — Z79899 Other long term (current) drug therapy: Secondary | ICD-10-CM | POA: Diagnosis not present

## 2019-09-25 DIAGNOSIS — Z7984 Long term (current) use of oral hypoglycemic drugs: Secondary | ICD-10-CM | POA: Diagnosis not present

## 2019-10-09 DIAGNOSIS — L720 Epidermal cyst: Secondary | ICD-10-CM | POA: Diagnosis not present

## 2019-11-07 DIAGNOSIS — R5383 Other fatigue: Secondary | ICD-10-CM | POA: Diagnosis not present

## 2019-11-07 DIAGNOSIS — J3489 Other specified disorders of nose and nasal sinuses: Secondary | ICD-10-CM | POA: Diagnosis not present

## 2019-11-07 DIAGNOSIS — R432 Parageusia: Secondary | ICD-10-CM | POA: Diagnosis not present

## 2020-01-29 DIAGNOSIS — J329 Chronic sinusitis, unspecified: Secondary | ICD-10-CM | POA: Diagnosis not present

## 2020-01-29 DIAGNOSIS — J019 Acute sinusitis, unspecified: Secondary | ICD-10-CM | POA: Diagnosis not present

## 2020-03-09 DIAGNOSIS — J343 Hypertrophy of nasal turbinates: Secondary | ICD-10-CM | POA: Diagnosis not present

## 2020-03-09 DIAGNOSIS — J342 Deviated nasal septum: Secondary | ICD-10-CM | POA: Diagnosis not present

## 2020-03-09 DIAGNOSIS — J310 Chronic rhinitis: Secondary | ICD-10-CM | POA: Diagnosis not present

## 2020-03-18 DIAGNOSIS — Z1211 Encounter for screening for malignant neoplasm of colon: Secondary | ICD-10-CM | POA: Diagnosis not present

## 2020-03-18 DIAGNOSIS — Z6833 Body mass index (BMI) 33.0-33.9, adult: Secondary | ICD-10-CM | POA: Diagnosis not present

## 2020-03-18 DIAGNOSIS — R7302 Impaired glucose tolerance (oral): Secondary | ICD-10-CM | POA: Diagnosis not present

## 2020-03-18 DIAGNOSIS — Z7182 Exercise counseling: Secondary | ICD-10-CM | POA: Diagnosis not present

## 2020-04-13 DIAGNOSIS — M791 Myalgia, unspecified site: Secondary | ICD-10-CM | POA: Diagnosis not present

## 2020-04-13 DIAGNOSIS — Z20822 Contact with and (suspected) exposure to covid-19: Secondary | ICD-10-CM | POA: Diagnosis not present

## 2020-04-15 DIAGNOSIS — R5383 Other fatigue: Secondary | ICD-10-CM | POA: Diagnosis not present

## 2020-04-15 DIAGNOSIS — H6692 Otitis media, unspecified, left ear: Secondary | ICD-10-CM | POA: Diagnosis not present

## 2020-04-15 DIAGNOSIS — Z6833 Body mass index (BMI) 33.0-33.9, adult: Secondary | ICD-10-CM | POA: Diagnosis not present

## 2020-04-15 DIAGNOSIS — R350 Frequency of micturition: Secondary | ICD-10-CM | POA: Diagnosis not present

## 2020-04-22 DIAGNOSIS — E876 Hypokalemia: Secondary | ICD-10-CM | POA: Diagnosis not present

## 2020-05-11 DIAGNOSIS — J342 Deviated nasal septum: Secondary | ICD-10-CM | POA: Diagnosis not present

## 2020-05-11 DIAGNOSIS — R430 Anosmia: Secondary | ICD-10-CM | POA: Diagnosis not present

## 2020-05-11 DIAGNOSIS — J343 Hypertrophy of nasal turbinates: Secondary | ICD-10-CM | POA: Diagnosis not present

## 2020-07-08 LAB — HM DIABETES EYE EXAM

## 2020-07-15 ENCOUNTER — Other Ambulatory Visit: Payer: Self-pay

## 2020-07-15 ENCOUNTER — Ambulatory Visit: Payer: Federal, State, Local not specified - PPO | Admitting: Internal Medicine

## 2020-07-15 ENCOUNTER — Encounter: Payer: Self-pay | Admitting: Internal Medicine

## 2020-07-15 VITALS — BP 125/84 | HR 72 | Temp 97.9°F | Resp 16 | Ht 65.5 in | Wt 203.0 lb

## 2020-07-15 DIAGNOSIS — I10 Essential (primary) hypertension: Secondary | ICD-10-CM | POA: Diagnosis not present

## 2020-07-15 DIAGNOSIS — E669 Obesity, unspecified: Secondary | ICD-10-CM

## 2020-07-15 DIAGNOSIS — J309 Allergic rhinitis, unspecified: Secondary | ICD-10-CM | POA: Insufficient documentation

## 2020-07-15 DIAGNOSIS — Z7689 Persons encountering health services in other specified circumstances: Secondary | ICD-10-CM | POA: Diagnosis not present

## 2020-07-15 DIAGNOSIS — E1169 Type 2 diabetes mellitus with other specified complication: Secondary | ICD-10-CM | POA: Insufficient documentation

## 2020-07-15 DIAGNOSIS — E119 Type 2 diabetes mellitus without complications: Secondary | ICD-10-CM | POA: Insufficient documentation

## 2020-07-15 DIAGNOSIS — J342 Deviated nasal septum: Secondary | ICD-10-CM

## 2020-07-15 DIAGNOSIS — Z6830 Body mass index (BMI) 30.0-30.9, adult: Secondary | ICD-10-CM

## 2020-07-15 DIAGNOSIS — Z800 Family history of malignant neoplasm of digestive organs: Secondary | ICD-10-CM

## 2020-07-15 NOTE — Assessment & Plan Note (Signed)
Uses Flonase Follows up with ENT

## 2020-07-15 NOTE — Assessment & Plan Note (Signed)
Care established Previous chart reviewed History and medications reviewed with the patient 

## 2020-07-15 NOTE — Assessment & Plan Note (Signed)
On Metformin 500 mg QD Advised to follow diabetic diet F/u CMP and lipid panel, plan to start statin Diabetic eye exam: Advised to follow up with Ophthalmology for diabetic eye exam

## 2020-07-15 NOTE — Progress Notes (Signed)
New Patient Office Visit  Subjective:  Patient ID: Teresa Russell, female    DOB: 09-11-1970  Age: 50 y.o. MRN: [id number]  CC:  Chief Complaint  Patient presents with  . New Patient (Initial Visit)    New patient former dr Huel Cote pt [facility] only problem is dry mouth     HPI Teresa Russell is a 50 year old female with PMH of HTN, DM, allergic rhinitis and obesity who presents for establishing care. She is a former patient of Dr Huel Cote.  She has been doing well overall.  HTN: BP is well-controlled. Takes medications regularly. Patient denies headache, dizziness, chest pain, dyspnea or palpitations.  DM: She is on Metformin currently. Denies any polyuria or polydipsia.  Allergic rhinitis: She uses Flonase. Continues to have recurrent sinus infections, has had ENT evaluation. She was advised to have surgery for deviated nasal septum.  Her mother had colon cancer at 30. Patient had colonoscopy in 2016, which showed hemorrhoids and diverticulosis.  She has not had COVID or flu vaccine.  Past Medical History:  Diagnosis Date  . Arthritis    wrists and right hip, right knee  . GERD (gastroesophageal reflux disease)   . Hypertension     Past Surgical History:  Procedure Laterality Date  . ABDOMINAL HYSTERECTOMY  2010   for heavy bleeding  . COLONOSCOPY N/A 10/14/2014   Procedure: COLONOSCOPY;  Surgeon: Danie Binder, MD;  Location[facility];  Service: Endoscopy;  Laterality: N/A;  115 - moved to 11:15 - office notified pt    Family History  Problem Relation Age of Onset  . Colon cancer Mother 10       passed away age 57  . Breast cancer Maternal Aunt        currently treating    Social History   Socioeconomic History  . Marital status: Single    Spouse name: Not on file  . Number of children: Not on file  . Years of education: Not on file  . Highest education level: Not on file  Occupational History  . Not on file  Tobacco Use  . Smoking status:  Former Smoker    Quit date: 05/02/2012    Years since quitting: 8.2  . Smokeless tobacco: Never Used  Substance and Sexual Activity  . Alcohol use: Yes    Alcohol/week: 0.0 standard drinks    Comment: Last drink 2012; used to drink a case of beer every Friday  . Drug use: No  . Sexual activity: Yes  Other Topics Concern  . Not on file  Social History Narrative  . Not on file   Social Determinants of Health   Financial Resource Strain: Not on file  Food Insecurity: Not on file  Transportation Needs: Not on file  Physical Activity: Not on file  Stress: Not on file  Social Connections: Not on file  Intimate Partner Violence: Not on file    ROS Review of Systems  Constitutional: Negative for chills and fever.  HENT: Positive for congestion and postnasal drip. Negative for sinus pressure, sinus pain and sore throat.   Eyes: Negative for pain and discharge.  Respiratory: Negative for cough and shortness of breath.   Cardiovascular: Negative for chest pain and palpitations.  Gastrointestinal: Negative for abdominal pain, constipation, diarrhea, nausea and vomiting.  Endocrine: Negative for polydipsia and polyuria.  Genitourinary: Negative for dysuria and hematuria.  Musculoskeletal: Negative for neck pain and neck stiffness.  Skin: Negative for rash.  Neurological: Negative for dizziness and weakness.  Psychiatric/Behavioral: Negative for agitation and behavioral problems.    Objective:   Today's Vitals: BP 125/84 (BP Location: Right Arm, Patient Position: Sitting, Cuff Size: Normal)   Pulse 72   Temp 97.9 F (36.6 C) (Oral)   Resp 16   Ht 5' 5.5" (1.664 m)   Wt 203 lb (92.1 kg)   SpO2 95%   BMI 33.27 kg/m   Physical Exam Vitals reviewed.  Constitutional:      General: She is not in acute distress.    Appearance: She is not diaphoretic.  HENT:     Head: Normocephalic and atraumatic.     Nose: Congestion present.     Comments: DNS    Mouth/Throat:     Mouth:  Mucous membranes are dry.  Eyes:     General: No scleral icterus.    Extraocular Movements: Extraocular movements intact.  Cardiovascular:     Rate and Rhythm: Normal rate and regular rhythm.     Pulses: Normal pulses.     Heart sounds: Normal heart sounds. No murmur heard.   Pulmonary:     Breath sounds: Normal breath sounds. No wheezing or rales.  Abdominal:     Palpations: Abdomen is soft.     Tenderness: There is no abdominal tenderness.  Musculoskeletal:     Cervical back: Neck supple. No tenderness.     Right lower leg: No edema.     Left lower leg: No edema.  Skin:    General: Skin is warm.     Findings: No rash.  Neurological:     General: No focal deficit present.     Mental Status: She is alert and oriented to person, place, and time.     Sensory: No sensory deficit.     Motor: No weakness.  Psychiatric:        Mood and Affect: Mood normal.        Behavior: Behavior normal.     Assessment & Plan:   Encounter to establish care Care established Previous chart reviewed History and medications reviewed with the patient   Allergic rhinitis Uses Flonase Follows up with ENT  Hypertension BP Readings from Last 1 Encounters:  07/15/20 125/84   Well-controlled with Metoprolol and HCTZ Counseled for compliance with the medications Advised DASH diet and moderate exercise/walking, at least 150 mins/week   Family history of colon cancer in mother Had colonoscopy in 2016 Needs a repeat colonoscopy, advised to f/u with GI  DM (diabetes mellitus) ([place]) On Metformin 500 mg QD Advised to follow diabetic diet F/u CMP and lipid panel, plan to start statin Diabetic eye exam: Advised to follow up with Ophthalmology for diabetic eye exam   Deviated nasal septum Has had ENT evaluation Needs to have surgery  Obesity Diet modification and moderate exercise/walking at least 150 mins/week    Outpatient Encounter Medications as of 07/15/2020  Medication Sig  .  albuterol (VENTOLIN HFA) 108 (90 Base) MCG/ACT inhaler Inhale into the lungs.  . fluticasone (FLONASE) 50 MCG/ACT nasal spray Place 2 sprays into both nostrils daily.  . hydrochlorothiazide (HYDRODIURIL) 25 MG tablet Take 25 mg by mouth daily.  Marland Kitchen ibuprofen (ADVIL,MOTRIN) 200 MG tablet Take 400 mg by mouth daily.   . metFORMIN (GLUCOPHAGE-XR) 500 MG 24 hr tablet Take by mouth.  . metoprolol tartrate (LOPRESSOR) 25 MG tablet Take 1 tablet by mouth daily.  . Multiple Vitamins-Minerals (EMERGEN-C VITAMIN C PO) Take 1 packet by mouth daily.  Marland Kitchen  Omega-3 1000 MG CAPS Take by mouth.  . Simethicone (PHAZYME PO) Take 1 capsule by mouth daily.  . [DISCONTINUED] atenolol (TENORMIN) 50 MG tablet Take 50 mg by mouth daily. (Patient not taking: Reported on 07/15/2020)  . [DISCONTINUED] HYDROcodone-homatropine (HYCODAN) 5-1.5 MG/5ML syrup Take 5 mLs by mouth every 6 (six) hours as needed for cough. (Patient not taking: Reported on 07/15/2020)  . [DISCONTINUED] omeprazole (PRILOSEC) 20 MG capsule Take 1 capsule (20 mg total) by mouth daily. (Patient not taking: Reported on 07/15/2020)  . [DISCONTINUED] potassium chloride SA (KLOR-CON) 20 MEQ tablet Take 1 tablet (20 mEq total) by mouth daily. (Patient not taking: Reported on 07/15/2020)  . [DISCONTINUED] sucralfate (CARAFATE) 1 GM/10ML suspension Take 10 mLs (1 g total) by mouth 4 (four) times daily -  with meals and at bedtime.   No facility-administered encounter medications on file as of 07/15/2020.    Follow-up: Return in about 4 months (around 11/14/2020) for Annual physical.   Lindell Spar, MD

## 2020-07-15 NOTE — Assessment & Plan Note (Signed)
Had colonoscopy in 2016 °Needs a repeat colonoscopy, advised to f/u with GI °

## 2020-07-15 NOTE — Assessment & Plan Note (Signed)
Diet modification and moderate exercise/walking at least 150 mins/week

## 2020-07-15 NOTE — Assessment & Plan Note (Signed)
Has had ENT evaluation Needs to have surgery

## 2020-07-15 NOTE — Assessment & Plan Note (Signed)
BP Readings from Last 1 Encounters:  07/15/20 125/84   Well-controlled with Metoprolol and HCTZ Counseled for compliance with the medications Advised DASH diet and moderate exercise/walking, at least 150 mins/week

## 2020-07-15 NOTE — Patient Instructions (Signed)
Please continue to take medications as prescribed.  Please follow low carbohydrate and low salt diet and perform moderate exercise/walking at least 150 mins/week.  Please use humidifier at night time to avoid dry air.

## 2020-07-16 ENCOUNTER — Other Ambulatory Visit: Payer: Self-pay | Admitting: Internal Medicine

## 2020-07-16 DIAGNOSIS — E782 Mixed hyperlipidemia: Secondary | ICD-10-CM

## 2020-07-16 LAB — TSH: TSH: 1.07 u[IU]/mL (ref 0.450–4.500)

## 2020-07-16 LAB — CMP14+EGFR
ALT: 21 IU/L (ref 0–32)
AST: 22 IU/L (ref 0–40)
Albumin/Globulin Ratio: 1.9 (ref 1.2–2.2)
Albumin: 4.5 g/dL (ref 3.8–4.8)
Alkaline Phosphatase: 61 IU/L (ref 44–121)
BUN/Creatinine Ratio: 18 (ref 9–23)
BUN: 13 mg/dL (ref 6–24)
Bilirubin Total: 0.5 mg/dL (ref 0.0–1.2)
CO2: 23 mmol/L (ref 20–29)
Calcium: 10.1 mg/dL (ref 8.7–10.2)
Chloride: 97 mmol/L (ref 96–106)
Creatinine, Ser: 0.73 mg/dL (ref 0.57–1.00)
Globulin, Total: 2.4 g/dL (ref 1.5–4.5)
Glucose: 154 mg/dL — ABNORMAL HIGH (ref 65–99)
Potassium: 3.4 mmol/L — ABNORMAL LOW (ref 3.5–5.2)
Sodium: 142 mmol/L (ref 134–144)
Total Protein: 6.9 g/dL (ref 6.0–8.5)
eGFR: 101 mL/min/{1.73_m2} (ref >59)

## 2020-07-16 LAB — VITAMIN D 25 HYDROXY (VIT D DEFICIENCY, FRACTURES): Vit D, 25-Hydroxy: 25.9 ng/mL — ABNORMAL LOW (ref 30.0–100.0)

## 2020-07-16 LAB — CBC WITH DIFFERENTIAL/PLATELET
Basophils Absolute: 0.1 10*3/uL (ref 0.0–0.2)
Basos: 1 %
EOS (ABSOLUTE): 0.3 10*3/uL (ref 0.0–0.4)
Eos: 4 %
Hematocrit: 40.1 % (ref 34.0–46.6)
Hemoglobin: 14 g/dL (ref 11.1–15.9)
Immature Grans (Abs): 0 10*3/uL (ref 0.0–0.1)
Immature Granulocytes: 1 %
Lymphocytes Absolute: 3.4 10*3/uL — ABNORMAL HIGH (ref 0.7–3.1)
Lymphs: 57 %
MCH: 29.4 pg (ref 26.6–33.0)
MCHC: 34.9 g/dL (ref 31.5–35.7)
MCV: 84 fL (ref 79–97)
Monocytes Absolute: 0.5 10*3/uL (ref 0.1–0.9)
Monocytes: 8 %
Neutrophils Absolute: 1.8 10*3/uL (ref 1.4–7.0)
Neutrophils: 29 %
Platelets: 309 10*3/uL (ref 150–450)
RBC: 4.76 x10E6/uL (ref 3.77–5.28)
RDW: 12.6 % (ref 11.7–15.4)
WBC: 6.1 10*3/uL (ref 3.4–10.8)

## 2020-07-16 LAB — LIPID PANEL
Chol/HDL Ratio: 4.9 ratio — ABNORMAL HIGH (ref 0.0–4.4)
Cholesterol, Total: 262 mg/dL — ABNORMAL HIGH (ref 100–199)
HDL: 54 mg/dL (ref >39)
LDL Chol Calc (NIH): 156 mg/dL — ABNORMAL HIGH (ref 0–99)
Triglycerides: 284 mg/dL — ABNORMAL HIGH (ref 0–149)
VLDL Cholesterol Cal: 52 mg/dL — ABNORMAL HIGH (ref 5–40)

## 2020-07-16 LAB — HEMOGLOBIN A1C
Est. average glucose Bld gHb Est-mCnc: 143 mg/dL
Hgb A1c MFr Bld: 6.6 % — ABNORMAL HIGH (ref 4.8–5.6)

## 2020-07-16 MED ORDER — ROSUVASTATIN CALCIUM 10 MG PO TABS: 10.0000 mg | ORAL_TABLET | Freq: Every day | ORAL | 1 refills | Status: DC

## 2020-07-28 ENCOUNTER — Telehealth: Payer: Self-pay

## 2020-07-28 NOTE — Telephone Encounter (Signed)
Patient called said the blood pressure Metoprolol 25 mg that she is currently on she has been feeling very fatique. Can you give the patient call # [phone].

## 2020-07-28 NOTE — Telephone Encounter (Signed)
Appt made 3/30 at 3:40

## 2020-07-28 NOTE — Telephone Encounter (Signed)
You can make pt an appt to discuss with provider

## 2020-07-29 ENCOUNTER — Encounter: Payer: Self-pay | Admitting: Internal Medicine

## 2020-07-29 ENCOUNTER — Telehealth (INDEPENDENT_AMBULATORY_CARE_PROVIDER_SITE_OTHER): Payer: Federal, State, Local not specified - PPO | Admitting: Internal Medicine

## 2020-07-29 ENCOUNTER — Other Ambulatory Visit: Payer: Self-pay

## 2020-07-29 DIAGNOSIS — R0683 Snoring: Secondary | ICD-10-CM

## 2020-07-29 DIAGNOSIS — I10 Essential (primary) hypertension: Secondary | ICD-10-CM

## 2020-07-29 DIAGNOSIS — G4710 Hypersomnia, unspecified: Secondary | ICD-10-CM | POA: Insufficient documentation

## 2020-07-29 MED ORDER — LISINOPRIL-HYDROCHLOROTHIAZIDE 10-12.5 MG PO TABS: 1.0000 | ORAL_TABLET | Freq: Every day | ORAL | 0 refills | Status: DC

## 2020-07-29 NOTE — Assessment & Plan Note (Signed)
BP Readings from Last 1 Encounters:  07/15/20 125/84   Switched to Lisinopril-HCTZ as B-blocker can cause fatigue Counseled for compliance with the medications Advised DASH diet and moderate exercise/walking, at least 150 mins/week

## 2020-07-29 NOTE — Patient Instructions (Signed)
Please start taking Lisinopril-HCTZ instead of Metoprolol and HCTZ.  You are being referred to Pulmonology for possible need for sleep study.

## 2020-07-29 NOTE — Progress Notes (Signed)
Virtual Visit via Telephone Note   This visit type was conducted due to national recommendations for restrictions regarding the COVID-19 Pandemic (e.g. social distancing) in an effort to limit this patient's exposure and mitigate transmission in our community.  Due to her co-morbid illnesses, this patient is at least at moderate risk for complications without adequate follow up.  This format is felt to be most appropriate for this patient at this time.  The patient did not have access to video technology/had technical difficulties with video requiring transitioning to audio format only (telephone).  All issues noted in this document were discussed and addressed.  No physical exam could be performed with this format.  Evaluation Performed:  Follow-up visit  Date:  07/29/2020   ID:  Teresa Russell, DOB 1970-09-06, MRN [id number]  Patient Location: Home Provider Location: Office/Clinic  Participants: Patient Location of Patient: Home Location of Provider: Telehealth Consent was obtain for visit to be over via telehealth. I verified that I am speaking with the correct person using two identifiers.  PCP:  Lindell Spar, MD   Chief Complaint:  Fatigue  History of Present Illness:    Teresa Russell is a 50 y.o. female who has a televisit for c/o chronic fatigue.  She has been having chronic fatigue for last 3 years. She tried to stop taking Metoprolol and HCTZ, which did not help much. She reports snoring at night and daytime sleepiness. She denies having any sleep study done.  The patient does not have symptoms concerning for COVID-19 infection (fever, chills, cough, or new shortness of breath).   Past Medical, Surgical, Social History, Allergies, and Medications have been Reviewed.  Past Medical History:  Diagnosis Date  . Arthritis    wrists and right hip, right knee  . GERD (gastroesophageal reflux disease)   . Hypertension    Past Surgical History:  Procedure  Laterality Date  . ABDOMINAL HYSTERECTOMY  2010   for heavy bleeding  . COLONOSCOPY N/A 10/14/2014   Procedure: COLONOSCOPY;  Surgeon: Danie Binder, MD;  [facility]: [facility];  Service: Endoscopy;  Laterality: N/A;  115 - moved to 11:15 - office notified pt     Current Meds  Medication Sig  . fluticasone (FLONASE) 50 MCG/ACT nasal spray Place 2 sprays into both nostrils daily.  Marland Kitchen ibuprofen (ADVIL,MOTRIN) 200 MG tablet Take 400 mg by mouth daily.   Marland Kitchen lisinopril-hydrochlorothiazide (ZESTORETIC) 10-12.5 MG tablet Take 1 tablet by mouth daily.  . metFORMIN (GLUCOPHAGE-XR) 500 MG 24 hr tablet Take by mouth.  . Omega-3 1000 MG CAPS Take by mouth.  . rosuvastatin (CRESTOR) 10 MG tablet Take 1 tablet (10 mg total) by mouth daily.  . Simethicone (PHAZYME PO) Take 1 capsule by mouth daily.     Allergies:   Wellbutrin [bupropion]   ROS:   Please see the history of present illness.     All other systems reviewed and are negative.   Labs/Other Tests and Data Reviewed:    Recent Labs: 07/15/2020: ALT 21; BUN 13; Creatinine, Ser 0.73; Hemoglobin 14.0; Platelets 309; Potassium 3.4; Sodium 142; TSH 1.070   Recent Lipid Panel Lab Results  Component Value Date/Time   CHOL 262 (H) 07/15/2020 09:14 AM   TRIG 284 (H) 07/15/2020 09:14 AM   HDL 54 07/15/2020 09:14 AM   CHOLHDL 4.9 (H) 07/15/2020 09:14 AM   LDLCALC 156 (H) 07/15/2020 09:14 AM    Wt Readings from Last 3 Encounters:  07/15/20 203 lb (  92.1 kg)  07/18/19 195 lb (88.5 kg)  02/04/19 195 lb (88.5 kg)     ASSESSMENT & PLAN:    Hypertension BP Readings from Last 1 Encounters:  07/15/20 125/84   Switched to Lisinopril-HCTZ as B-blocker can cause fatigue Counseled for compliance with the medications Advised DASH diet and moderate exercise/walking, at least 150 mins/week   Snoring With daytime fatigue and sleepiness STOP-BANG: 4 Referred to Pulmonology for sleep study    Time:   Today, I have spent 13 minutes  reviewing the chart, including problem list, medications, and with the patient with telehealth technology discussing the above problems.   Medication Adjustments/Labs and Tests Ordered: Current medicines are reviewed at length with the patient today.  Concerns regarding medicines are outlined above.   Tests Ordered: Orders Placed This Encounter  Procedures  . Ambulatory referral to Pulmonology    Medication Changes: Meds ordered this encounter  Medications  . lisinopril-hydrochlorothiazide (ZESTORETIC) 10-12.5 MG tablet    Sig: Take 1 tablet by mouth daily.    Dispense:  90 tablet    Refill:  0     Note: This dictation was prepared with Dragon dictation along with smaller phrase technology. Similar sounding words can be transcribed inadequately or may not be corrected upon review. Any transcriptional errors that result from this process are unintentional.      Disposition:  Follow up  Signed, Lindell Spar, MD  07/29/2020 3:58 PM     [facility]

## 2020-07-29 NOTE — Assessment & Plan Note (Signed)
With daytime fatigue and sleepiness STOP-BANG: 4 Referred to Pulmonology for sleep study

## 2020-07-31 ENCOUNTER — Encounter: Payer: Self-pay | Admitting: *Deleted

## 2020-08-12 ENCOUNTER — Encounter: Payer: Self-pay | Admitting: Internal Medicine

## 2020-08-12 ENCOUNTER — Other Ambulatory Visit: Payer: Self-pay

## 2020-08-12 ENCOUNTER — Ambulatory Visit: Payer: Federal, State, Local not specified - PPO | Admitting: Internal Medicine

## 2020-08-12 VITALS — BP 122/81 | HR 82 | Temp 97.6°F | Ht 65.5 in | Wt 201.0 lb

## 2020-08-12 DIAGNOSIS — N6002 Solitary cyst of left breast: Secondary | ICD-10-CM

## 2020-08-12 DIAGNOSIS — Z1231 Encounter for screening mammogram for malignant neoplasm of breast: Secondary | ICD-10-CM

## 2020-08-12 MED ORDER — NYSTATIN 100000 UNIT/GM EX POWD: 1.0000 "application " | Freq: Three times a day (TID) | CUTANEOUS | 0 refills | Status: DC

## 2020-08-12 NOTE — Progress Notes (Signed)
Acute Office Visit  Subjective:    Patient ID: Teresa Russell, female    DOB: 1971/01/04, 50 y.o.   MRN: [id number]  Chief Complaint  Patient presents with  . Cyst    Has a hard knot under L breast, has been there for awhile but is now sore x1 day ago.    HPI Patient is in today for evaluation of a cyst like lesion underneath her left breast. She states that she has had it for many years, but it has been mild painful since yesterday. Denies any localized redness, discharge or warmth. Denies any itching in the area. Denies any fever or chills. She has h/o epidermal cyst removal on her back.  Past Medical History:  Diagnosis Date  . Arthritis    wrists and right hip, right knee  . GERD (gastroesophageal reflux disease)   . Hypertension     Past Surgical History:  Procedure Laterality Date  . ABDOMINAL HYSTERECTOMY  2010   for heavy bleeding  . COLONOSCOPY N/A 10/14/2014   Procedure: COLONOSCOPY;  Surgeon: Danie Binder, MD;  Location: [facility];  Service: Endoscopy;  Laterality: N/A;  115 - moved to 11:15 - office notified pt    Family History  Problem Relation Age of Onset  . Colon cancer Mother 81       passed away age 34  . Breast cancer Maternal Aunt        currently treating    Social History   Socioeconomic History  . Marital status: Single    Spouse name: Not on file  . Number of children: Not on file  . Years of education: Not on file  . Highest education level: Not on file  Occupational History  . Not on file  Tobacco Use  . Smoking status: Former Smoker    Quit date: 05/02/2012    Years since quitting: 8.2  . Smokeless tobacco: Never Used  Substance and Sexual Activity  . Alcohol use: Yes    Alcohol/week: 0.0 standard drinks    Comment: Last drink 2012; used to drink a case of beer every Friday  . Drug use: No  . Sexual activity: Yes  Other Topics Concern  . Not on file  Social History Narrative  . Not on file   Social Determinants of  Health   Financial Resource Strain: Not on file  Food Insecurity: Not on file  Transportation Needs: Not on file  Physical Activity: Not on file  Stress: Not on file  Social Connections: Not on file  Intimate Partner Violence: Not on file    Outpatient Medications Prior to Visit  Medication Sig Dispense Refill  . albuterol (VENTOLIN HFA) 108 (90 Base) MCG/ACT inhaler Inhale into the lungs.    . fluticasone (FLONASE) 50 MCG/ACT nasal spray Place 2 sprays into both nostrils daily.    Marland Kitchen ibuprofen (ADVIL,MOTRIN) 200 MG tablet Take 400 mg by mouth daily.     Marland Kitchen lisinopril-hydrochlorothiazide (ZESTORETIC) 10-12.5 MG tablet Take 1 tablet by mouth daily. 90 tablet 0  . metFORMIN (GLUCOPHAGE-XR) 500 MG 24 hr tablet Take by mouth.    . Multiple Vitamins-Minerals (EMERGEN-C VITAMIN C PO) Take 1 packet by mouth daily.    . Omega-3 1000 MG CAPS Take by mouth.    . rosuvastatin (CRESTOR) 10 MG tablet Take 1 tablet (10 mg total) by mouth daily. 90 tablet 1  . Simethicone (PHAZYME PO) Take 1 capsule by mouth daily.     No facility-administered  medications prior to visit.    Allergies  Allergen Reactions  . Wellbutrin [Bupropion] Other (See Comments)    Spinal pain    Review of Systems  Constitutional: Negative for chills and fever.  Respiratory: Negative for cough and shortness of breath.   Cardiovascular: Negative for chest pain and palpitations.  Gastrointestinal: Negative for nausea and vomiting.  Skin:       Cyst underneath left breast       Objective:    Physical Exam Vitals reviewed.  Constitutional:      Appearance: She is obese.  HENT:     Head: Normocephalic and atraumatic.  Eyes:     General: No scleral icterus.    Extraocular Movements: Extraocular movements intact.  Cardiovascular:     Rate and Rhythm: Normal rate and regular rhythm.     Pulses: Normal pulses.     Heart sounds: Normal heart sounds. No murmur heard.   Pulmonary:     Breath sounds: Normal breath  sounds. No wheezing or rales.  Musculoskeletal:     Cervical back: Neck supple. No tenderness.  Skin:    General: Skin is warm.     Findings: No rash.     Comments: Small cyst like lesion underneath left breast, around 1 cm in diameter - no erythema or surrounding swelling  Neurological:     General: No focal deficit present.     Mental Status: She is alert and oriented to person, place, and time.  Psychiatric:        Mood and Affect: Mood normal.        Behavior: Behavior normal.     BP 122/81 (BP Location: Right Arm, Patient Position: Sitting, Cuff Size: Large)   Pulse 82   Temp 97.6 F (36.4 C) (Temporal)   Ht 5' 5.5" (1.664 m)   Wt 201 lb (91.2 kg)   SpO2 96%   BMI 32.94 kg/m  Wt Readings from Last 3 Encounters:  08/12/20 201 lb (91.2 kg)  07/15/20 203 lb (92.1 kg)  07/18/19 195 lb (88.5 kg)    Health Maintenance Due  Topic Date Due  . Hepatitis C Screening  Never done  . FOOT EXAM  Never done  . HIV Screening  Never done    There are no preventive care reminders to display for this patient.   Lab Results  Component Value Date   TSH 1.070 07/15/2020   Lab Results  Component Value Date   WBC 6.1 07/15/2020   HGB 14.0 07/15/2020   HCT 40.1 07/15/2020   MCV 84 07/15/2020   PLT 309 07/15/2020   Lab Results  Component Value Date   NA 142 07/15/2020   K 3.4 (L) 07/15/2020   CO2 23 07/15/2020   GLUCOSE 154 (H) 07/15/2020   BUN 13 07/15/2020   CREATININE 0.73 07/15/2020   BILITOT 0.5 07/15/2020   ALKPHOS 61 07/15/2020   AST 22 07/15/2020   ALT 21 07/15/2020   PROT 6.9 07/15/2020   ALBUMIN 4.5 07/15/2020   CALCIUM 10.1 07/15/2020   ANIONGAP 12 02/04/2019   Lab Results  Component Value Date   CHOL 262 (H) 07/15/2020   Lab Results  Component Value Date   HDL 54 07/15/2020   Lab Results  Component Value Date   LDLCALC 156 (H) 07/15/2020   Lab Results  Component Value Date   TRIG 284 (H) 07/15/2020   Lab Results  Component Value Date    CHOLHDL 4.9 (H) 07/15/2020   Lab Results  Component Value Date   HGBA1C 6.6 (H) 07/15/2020       Assessment & Plan:   Problem List Items Addressed This Visit   None   Visit Diagnoses    Cyst of left breast    -  Primary Does not appear to be infected currently Referred to General surgery for further evaluation Advised to contact if any drainage or painful Nystatin to prevent fungal infection   Relevant Medications   nystatin (MYCOSTATIN/NYSTOP) powder   Other Relevant Orders   Ambulatory referral to General Surgery        Ordered Mammography for screening purposes.   Meds ordered this encounter  Medications  . nystatin (MYCOSTATIN/NYSTOP) powder    Sig: Apply 1 application topically 3 (three) times daily.    Dispense:  15 g    Refill:  0     Jazz Rogala Keith Rake, MD

## 2020-08-12 NOTE — Patient Instructions (Signed)
Breast Cyst  A breast cyst is a sac in the breast that is filled with fluid. They are usually noncancerous (benign) and are common among women. Breast cysts are most often in the upper, outer portion of the breast. One or more cysts may develop. They form when fluid builds up inside the breast glands. There are several types of breast cysts. Some are too small to feel, but these can be seen with imaging tests such as an X-ray of the breast (mammogram) or ultrasound. Breast cysts do not increase your risk of breast cancer. They usually disappear after you no longer have a menstrual cycle (after menopause), unless you take artificial hormones (are on hormone therapy). What are the causes? This condition may be caused by:  Blockage of tubes (ducts) in the breast glands, which leads to fluid buildup. Duct blockage may result from: ? Fibrocystic breast changes. This is a common, benign condition that occurs when women go through hormonal changes during the menstrual cycle. This is a common cause of multiple breast cysts. ? Overgrowth of breast tissue or breast glands. ? Scar tissue in the breast from previous surgery.  Changes in certain female hormones (estrogen and progesterone). The exact cause of this condition is not known. What increases the risk? You may be more likely to develop breast cysts if you have not gone through menopause. What are the signs or symptoms? Symptoms of this condition include:  Feeling one or more smooth, round, soft lumps (like grapes) in the breast that are easily movable. The lump or lumps may get bigger and more painful before your menstrual period and get smaller after your menstrual period.  Breast discomfort or pain. How is this diagnosed? This condition may be diagnosed based on:  A physical exam. A cyst can be felt by your health care provider during this exam.  Imaging tests, such as mammogram or ultrasound. Fluid may be removed from the cyst with a  needle (fine-needle aspiration) and tested to make sure the cyst is not cancerous. How is this treated? Treatment may not be needed for this condition. Your health care provider may monitor the cyst to see if it goes away on its own. If the cyst is uncomfortable or gets bigger, or if you do not like how the cyst makes your breast look, you may need treatment. Treatment may include:  Hormone therapy.  Fine-needle aspiration to drain fluid from the cyst. There is a chance of the cyst coming back (recurring) after aspiration.  Surgery to remove the cyst. Follow these instructions at home: Self-exams  Do a breast self-exam every month, or as often as directed. A breast self-exam involves: ? Comparing your breasts in the mirror. ? Looking for visible changes in your skin or nipples. ? Feeling for lumps or changes.  Having many breast cysts may make it harder to feel for new lumps. Understand how your breasts normally look and feel, and write down any changes in your breasts. Tell your health care provider about any changes.   Eating and drinking  Follow instructions from your health care provider about eating and drinking restrictions.  Drink enough fluid to keep your urine pale yellow.  Avoid caffeine.  Cut down on salt (sodium) in what you eat and drink, especially before your menstrual period. Too much sodium can cause fluid buildup, breast swelling, and discomfort. General instructions  See your health care provider regularly. ? Get a yearly physical exam. ? If you are 57-43 years old, get  a clinical breast exam every 1-3 years. After the age of 66 years, get this exam every year. ? Get mammograms as often as directed.  Take over-the-counter and prescription medicines only as told by your health care provider.  Wear a supportive bra, especially when exercising.  Keep all follow-up visits as told by your health care provider. This is important. Contact a health care provider  if:  You feel, or think you feel, a lump in your breast.  You notice that both breasts look or feel different than usual.  Your breast is still causing pain after your menstrual period is over.  You find new lumps or bumps that were not there before.  You feel lumps in your armpit. Get help right away if:  You have severe pain, tenderness, redness, or warmth in your breast.  You have fluid or blood leaking from your nipple.  Your breast lump becomes hard and painful.  You notice dimpling or wrinkling of the breast or nipple. Summary  A breast cyst is a sac in the breast that is filled with fluid.  Treatment may not be needed for this condition.  If the cyst is uncomfortable or gets bigger, or if you do not like how the cyst makes your breast look, you may need treatment. This information is not intended to replace advice given to you by your health care provider. Make sure you discuss any questions you have with your health care provider. Document Revised: 09/04/2018 Document Reviewed: 09/04/2018 Elsevier Patient Education  [place].

## 2020-08-17 ENCOUNTER — Ambulatory Visit (INDEPENDENT_AMBULATORY_CARE_PROVIDER_SITE_OTHER): Payer: Federal, State, Local not specified - PPO | Admitting: Pulmonary Disease

## 2020-08-17 ENCOUNTER — Telehealth: Payer: Self-pay

## 2020-08-17 ENCOUNTER — Other Ambulatory Visit: Payer: Self-pay

## 2020-08-17 ENCOUNTER — Encounter: Payer: Self-pay | Admitting: Pulmonary Disease

## 2020-08-17 DIAGNOSIS — G4710 Hypersomnia, unspecified: Secondary | ICD-10-CM

## 2020-08-17 DIAGNOSIS — J301 Allergic rhinitis due to pollen: Secondary | ICD-10-CM

## 2020-08-17 NOTE — Assessment & Plan Note (Signed)
Given excessive daytime somnolence, narrow pharyngeal exam & loud snoring, obstructive sleep apnea is very likely & an overnight polysomnogram will be scheduled as a home study. The pathophysiology of obstructive sleep apnea , it's cardiovascular consequences & modes of treatment including CPAP were discused with the patient in detail & they evidenced understanding.  Pretest quality is high.  She would be willing to use a CPAP if needed.  If home sleep study is nondiagnostic, we will proceed with a PSG followed by MSLT to look for other causes of somnolence such as narcolepsy

## 2020-08-17 NOTE — Assessment & Plan Note (Signed)
Continue steroid nasal spray, each nare at bedtime

## 2020-08-17 NOTE — Progress Notes (Signed)
Subjective:    Patient ID: Teresa Russell, female    DOB: 1970-05-28, 50 y.o.   MRN: [id number]  HPI  Chief Complaint  Patient presents with  . Consult    Reports snoring, fatigue with driving.     50 year old [employer] employee presents for evaluation of excessive daytime somnolence and fatigue. She reports ongoing symptoms for 2 years.  She lives in [place] and works at the [employer] in downtown [place].  She has a 43-minute drive into work and she finds herself getting sleepy on the way in. Epworth sleepiness score is 13 and she reports sleepiness in church, lying down to rest in the afternoons or sitting quietly after lunch. Bedtime is 9:30 PM sleep latency is minimal, she sleeps on her left side with 2 pillows, reports 3-4 nocturnal awakenings including nocturia and is out of bed by 5 AM with dryness of mouth.  She can motivate herself to go to the gym after waking up about 5 times a week but then gets sleepy on the drive into work. She worked third shift more than 6 years ago There is no history suggestive of cataplexy, sleep paralysis or parasomnias   PMH -hypertension-change medication from beta-blocker to lisinopril/HCTZ Diabetes Hyperlipidemia    Past Medical History:  Diagnosis Date  . Arthritis    wrists and right hip, right knee  . GERD (gastroesophageal reflux disease)   . Hyperlipidemia   . Hypertension   . Pneumonia     Past Surgical History:  Procedure Laterality Date  . ABDOMINAL HYSTERECTOMY  2010   for heavy bleeding  . COLONOSCOPY N/A 10/14/2014   Procedure: COLONOSCOPY;  Surgeon: Danie Binder, MD;  [facility];  Service: Endoscopy;  Laterality: N/A;  115 - moved to 11:15 - office notified pt   Allergies  Allergen Reactions  . Wellbutrin [Bupropion] Other (See Comments)    Spinal pain    Social History   Socioeconomic History  . Marital status: Single    Spouse name: Not on file  . Number of children: Not on file  . Years of  education: Not on file  . Highest education level: Not on file  Occupational History  . Not on file  Tobacco Use  . Smoking status: Former Smoker    Quit date: 05/02/2012    Years since quitting: 8.2  . Smokeless tobacco: Never Used  Vaping Use  . Vaping Use: Never used  Substance and Sexual Activity  . Alcohol use: Yes    Alcohol/week: 0.0 standard drinks    Comment: Last drink 2012; used to drink a case of beer every Friday  . Drug use: No  . Sexual activity: Yes  Other Topics Concern  . Not on file  Social History Narrative  . Not on file   Social Determinants of Health   Financial Resource Strain: Not on file  Food Insecurity: Not on file  Transportation Needs: Not on file  Physical Activity: Not on file  Stress: Not on file  Social Connections: Not on file  Intimate Partner Violence: Not on file    Family History  Problem Relation Age of Onset  . Colon cancer Mother 89       passed away age 29  . Breast cancer Maternal Aunt        currently treating    Review of Systems Constitutional: negative for anorexia, fevers and sweats  Eyes: negative for irritation, redness and visual disturbance  Ears, nose, mouth, throat,  and face: negative for earaches, epistaxis, nasal congestion and sore throat  Respiratory: negative for cough, dyspnea on exertion, sputum and wheezing  Cardiovascular: negative for chest pain, dyspnea, lower extremity edema, orthopnea, palpitations and syncope  Gastrointestinal: negative for abdominal pain, constipation, diarrhea, melena, nausea and vomiting  Genitourinary:negative for dysuria, frequency and hematuria  Hematologic/lymphatic: negative for bleeding, easy bruising and lymphadenopathy  Musculoskeletal:negative for arthralgias, muscle weakness and stiff joints  Neurological: negative for coordination problems, gait problems, headaches and weakness  Endocrine: negative for diabetic symptoms including polydipsia, polyuria and weight  loss     Objective:   Physical Exam  Gen. Pleasant, obese, in no distress, normal affect ENT - no pallor,icterus, no post nasal drip, class 2 airway,  Neck: No JVD, no thyromegaly, no carotid bruits Lungs: no use of accessory muscles, no dullness to percussion, decreased without rales or rhonchi  Cardiovascular: Rhythm regular, heart sounds  normal, no murmurs or gallops, no peripheral edema Abdomen: soft and non-tender, no hepatosplenomegaly, BS normal. Musculoskeletal: No deformities, no cyanosis or clubbing Neuro:  alert, non focal, no tremors       Assessment & Plan:

## 2020-08-17 NOTE — Addendum Note (Signed)
Addended by: Geanie Logan on: 08/17/2020 03:40 PM   Modules accepted: Orders

## 2020-08-17 NOTE — Patient Instructions (Signed)
Home sleep study 

## 2020-08-17 NOTE — Telephone Encounter (Signed)
Pt is calling regarding referrals, gave names of [facility], and [facility]

## 2020-08-20 ENCOUNTER — Inpatient Hospital Stay (HOSPITAL_COMMUNITY): Admission: RE | Admit: 2020-08-20 | Payer: Federal, State, Local not specified - PPO | Source: Ambulatory Visit

## 2020-08-20 ENCOUNTER — Other Ambulatory Visit: Payer: Self-pay

## 2020-08-20 ENCOUNTER — Ambulatory Visit: Payer: Federal, State, Local not specified - PPO | Admitting: General Surgery

## 2020-08-20 ENCOUNTER — Other Ambulatory Visit: Payer: Self-pay | Admitting: Family Medicine

## 2020-08-20 ENCOUNTER — Encounter: Payer: Self-pay | Admitting: General Surgery

## 2020-08-20 VITALS — BP 114/78 | HR 76 | Temp 97.5°F | Resp 14 | Ht 65.5 in | Wt 202.0 lb

## 2020-08-20 DIAGNOSIS — N611 Abscess of the breast and nipple: Secondary | ICD-10-CM

## 2020-08-20 MED ORDER — SULFAMETHOXAZOLE-TRIMETHOPRIM 800-160 MG PO TABS: 1.0000 | ORAL_TABLET | Freq: Two times a day (BID) | ORAL | 0 refills | Status: DC

## 2020-08-20 NOTE — Patient Instructions (Signed)
Mastitis  Mastitis is inflammation of the breast tissue. It occurs most often in women who are breastfeeding, but it can also affect other women, and sometimes even men. What are the causes? This condition is usually caused by a bacterial infection. Bacteria enter the breast tissue through cuts or openings in the skin. Typically, this occurs with breastfeeding because of cracked or irritated nipples. Sometimes, it can occur when there is no opening in the skin. This is usually caused by plugged milk ducts. Other causes include:  Nipple piercing.  Some forms of breast cancer. What are the signs or symptoms? Symptoms of this condition include:  Swelling, redness, tenderness, and pain in an area of the breast. The area may also feel warm to the touch. These symptoms usually affect the upper part of the breast, toward the armpit region.  Swelling of the glands under the arm on the same side.  Fever.  Rapid pulse.  Fatigue, headache, and flu-like muscle aches. If an infection is allowed to progress, a collection of pus (abscess) may develop. How is this diagnosed? This condition can usually be diagnosed based on a physical exam and your symptoms. You may also have other tests, such as:  Blood tests to determine if your body is fighting a bacterial infection.  Mammogram or ultrasound tests to rule out other problems or diseases.  Testing of pus and other fluids. Pus from the breast may be collected and examined in the lab. If an abscess has developed, the fluid in the abscess can be removed with a needle. This test can be used to confirm the diagnosis and identify the bacteria present.  If you are breastfeeding, breast milk may be cultured and tested for bacteria. How is this treated? Treatment for this condition may include:  Applying heat or cold compresses to the affected area.  Medicine for pain.  Antibiotic medicine to treat a bacterial infection. This is usually taken by  mouth.  Self-care such as rest and increased fluid intake.  If an abscess has developed, it may be treated by removing fluid with a needle. Mastitis that occurs with breastfeeding will sometimes go away on its own, so your health care provider may choose to wait 24 hours after first seeing you to decide whether a prescription medicine is needed. You may be told of different ways to help manage breastfeeding, such as continuing to breastfeed or pump in order to ensure adequate milk flow. Follow these instructions at home: Medicines  Take over-the-counter and prescription medicines only as told by your health care provider.  If you were prescribed an antibiotic medicine, take it as told by your health care provider. Do not stop taking the antibiotic even if you start to feel better. General instructions  Do not wear a tight or underwire bra. Wear a soft, supportive bra.  Increase your fluid intake, especially if you have a fever.  Get plenty of rest. If you are breastfeeding:  Continue to empty your breasts as often as possible either by breastfeeding or by using a breast pump. This will decrease the pressure and the pain that comes with it. Ask your health care provider if changes need to be made to your breastfeeding or pumping routine.  Keep your nipples clean and dry.  During breastfeeding, empty the first breast completely before going to the other breast. If your baby is not emptying your breasts completely, use a breast pump to empty your breasts.  Use breast massage during feeding or pumping sessions.    If directed, apply moist heat to the affected area of your breast right before breastfeeding or pumping. Use the heat source that your health care provider recommends.  If directed, put ice on the affected area of your breast right after breastfeeding or pumping: ? Put ice in a plastic bag. ? Place a towel between your skin and the bag. ? Leave the ice on for 20 minutes.  If  you go back to work, pump your breasts while at work to stay within your nursing schedule.  Avoid allowing your breasts to become overly filled with milk (engorged). Contact a health care provider if:  You have pus-like discharge from the breast.  You have a fever.  Your symptoms do not improve within 2 days of starting treatment. Get help right away if:  Your pain and swelling are getting worse.  You have pain that is not controlled with medicine.  You have a red line extending from the breast toward your armpit. Summary  Mastitis is inflammation of the breast tissue. It occurs most often in women who are breastfeeding, but it can also affect non-breastfeeding women and some men.  This condition is usually caused by a bacterial infection.  This condition may be treated with hot and cold compresses, medicines, self-care, and certain breastfeeding strategies.  If you were prescribed an antibiotic medicine, take it as told by your health care provider. Do not stop taking the antibiotic even if you start to feel better. This information is not intended to replace advice given to you by your health care provider. Make sure you discuss any questions you have with your health care provider. Document Revised: 03/31/2017 Document Reviewed: 05/10/2016 Elsevier Patient Education  2021 [place]. Epidermoid Cyst  An epidermoid cyst, also called an epidermal cyst, is a small lump under your skin. The cyst contains a substance called keratin. Do not try to pop or open the cyst yourself. What are the causes?  A blocked hair follicle.  A hair that curls and re-enters the skin instead of growing straight out of the skin.  A blocked pore.  Irritated skin.  An injury to the skin.  Certain conditions that are passed along from parent to child.  Human papillomavirus (HPV). This happens rarely when cysts occur on the bottom of the feet.  Long-term sun damage to the skin. What  increases the risk?  Having acne.  Being female.  Having an injury to the skin.  Being past puberty.  Having certain conditions caused by genes (genetic disorder) What are the signs or symptoms? These cysts are usually harmless, but they can get infected. Symptoms of infection may include:  Redness.  Inflammation.  Tenderness.  Warmth.  Fever.  A bad-smelling substance that drains from the cyst.  Pus that drains from the cyst. How is this treated? In many cases, epidermoid cysts go away on their own without treatment. If a cyst becomes infected, treatment may include:  Opening and draining the cyst, done by a doctor. After draining, you may need minor surgery to remove the rest of the cyst.  Antibiotic medicine.  Shots of medicines (steroids) that help to reduce inflammation.  Surgery to remove the cyst. Surgery may be done if the cyst: ? Becomes large. ? Bothers you. ? Has a chance of turning into cancer.  Do not try to open a cyst yourself. Follow these instructions at home: Medicines  Take over-the-counter and prescription medicines as told by your doctor.  If you were prescribed  an antibiotic medicine, take it as told by your doctor. Do not stop taking it even if you start to feel better. General instructions  Keep the area around your cyst clean and dry.  Wear loose, dry clothing.  Avoid touching your cyst.  Check your cyst every day for signs of infection. Check for: ? Redness, swelling, or pain. ? Fluid or blood. ? Warmth. ? Pus or a bad smell.  Keep all follow-up visits. How is this prevented?  Wear clean, dry, clothing.  Avoid wearing tight clothing.  Keep your skin clean and dry. Take showers or baths every day. Contact a doctor if:  Your cyst has symptoms of infection.  Your condition does not improve or gets worse.  You have a cyst that looks different from other cysts you have had.  You have a fever. Get help right away  if:  Redness spreads from the cyst into the area close by. Summary  An epidermoid cyst is a small lump under your skin.  If a cyst becomes infected, treatment may include surgery to open and drain the cyst, or to remove it.  Take over-the-counter and prescription medicines only as told by your doctor.  Contact a doctor if your condition is not improving or is getting worse.  Keep all follow-up visits. This information is not intended to replace advice given to you by your health care provider. Make sure you discuss any questions you have with your health care provider. Document Revised: 07/24/2019 Document Reviewed: 07/24/2019 Elsevier Patient Education  [place].

## 2020-08-22 NOTE — Progress Notes (Signed)
Teresa Russell; [id number]; [birth year]   HPI Patient is a 50 year old black female who was referred to my care by Dr. Posey Pronto for evaluation and treatment of a tender area in the left breast.  Patient states she has had intermittent episodes of drainage from an area along the inferior aspect of her left breast for some time now.  She does have skin irritation along the inframammary fold and has tried creams in the past.  She recently started to have a knot develop which is tender in nature.  She has not had a mammogram.  She denies any fevers or chills. Past Medical History:  Diagnosis Date  . Arthritis    wrists and right hip, right knee  . GERD (gastroesophageal reflux disease)   . Hyperlipidemia   . Hypertension   . Pneumonia     Past Surgical History:  Procedure Laterality Date  . ABDOMINAL HYSTERECTOMY  2010   for heavy bleeding  . COLONOSCOPY N/A 10/14/2014   Procedure: COLONOSCOPY;  Surgeon: Danie Binder, MD;  Location: [facility];  Service: Endoscopy;  Laterality: N/A;  115 - moved to 11:15 - office notified pt    Family History  Problem Relation Age of Onset  . Colon cancer Mother 28       passed away age 81  . Breast cancer Maternal Aunt        currently treating    Current Outpatient Medications on File Prior to Visit  Medication Sig Dispense Refill  . fluticasone (FLONASE) 50 MCG/ACT nasal spray Place 2 sprays into both nostrils daily.    Marland Kitchen ibuprofen (ADVIL,MOTRIN) 200 MG tablet Take 400 mg by mouth daily.     Marland Kitchen lisinopril-hydrochlorothiazide (ZESTORETIC) 10-12.5 MG tablet Take 1 tablet by mouth daily. 90 tablet 0  . metFORMIN (GLUCOPHAGE-XR) 500 MG 24 hr tablet Take by mouth.    . Multiple Vitamins-Minerals (EMERGEN-C VITAMIN C PO) Take 1 packet by mouth daily.    Marland Kitchen nystatin (MYCOSTATIN/NYSTOP) powder Apply 1 application topically 3 (three) times daily. 15 g 0  . Omega-3 1000 MG CAPS Take by mouth.    . rosuvastatin (CRESTOR) 10 MG tablet Take 1 tablet (10 mg  total) by mouth daily. 90 tablet 1  . Simethicone (PHAZYME PO) Take 1 capsule by mouth daily.    Marland Kitchen albuterol (VENTOLIN HFA) 108 (90 Base) MCG/ACT inhaler Inhale into the lungs. (Patient not taking: No sig reported)     No current facility-administered medications on file prior to visit.    Allergies  Allergen Reactions  . Wellbutrin [Bupropion] Other (See Comments)    Spinal pain    Social History   Substance and Sexual Activity  Alcohol Use Yes  . Alcohol/week: 0.0 standard drinks   Comment: Last drink 2012; used to drink a case of beer every Friday    Social History   Tobacco Use  Smoking Status Former Smoker  . Quit date: 05/02/2012  . Years since quitting: 8.3  Smokeless Tobacco Never Used    Review of Systems  Constitutional: Negative.   HENT: Positive for sinus pain.   Eyes: Negative.   Respiratory: Negative.   Cardiovascular: Negative.   Gastrointestinal: Negative.   Genitourinary: Negative.   Musculoskeletal: Negative.   Skin: Negative.   Neurological: Negative.   Endo/Heme/Allergies: Negative.   Psychiatric/Behavioral: Negative.     Objective   Vitals:   08/20/20 0944  BP: 114/78  Pulse: 76  Resp: 14  Temp: (!) 97.5 F (36.4 C)  SpO2:  96%    Physical Exam Vitals reviewed. Exam conducted with a chaperone present.  Constitutional:      Appearance: Normal appearance. She is not ill-appearing.  HENT:     Head: Normocephalic and atraumatic.  Cardiovascular:     Rate and Rhythm: Normal rate and regular rhythm.     Heart sounds: Normal heart sounds. No murmur heard. No friction rub. No gallop.   Pulmonary:     Effort: Pulmonary effort is normal. No respiratory distress.     Breath sounds: Normal breath sounds. No stridor. No wheezing, rhonchi or rales.  Skin:    General: Skin is warm and dry.  Neurological:     Mental Status: She is alert and oriented to person, place, and time.   Breast: Left breast examination reveals a fluctuant  erythematous abscess at the 6 o'clock position of the left breast.  No nipple discharge or dimpling is noted.  The axilla is negative for palpable nodes.  Right breast examination reveals no dominant mass, nipple discharge, or dimpling.  The axilla is negative for palpable nodes.  Procedure note: The area of fluctuance was prepped with Betadine.  Arrow freeze was applied.  The abscess was incised and drained.  Sebum was expressed, consistent with an infected sebaceous cyst of the left breast.  The wound was cleaned.  A dry dressing was applied.  Patient tolerated the procedure well.  Assessment  Left breast abscess secondary to infected sebaceous cyst Plan   Will start Bactrim DS 1 tablet p.o. twice daily x10 days.  I will see the patient in follow-up in 1 week.  She should keep the wound clean and dry.  We will arrange for mammography at a future date.

## 2020-08-27 ENCOUNTER — Ambulatory Visit (INDEPENDENT_AMBULATORY_CARE_PROVIDER_SITE_OTHER): Payer: Federal, State, Local not specified - PPO | Admitting: General Surgery

## 2020-08-27 ENCOUNTER — Other Ambulatory Visit: Payer: Self-pay

## 2020-08-27 ENCOUNTER — Encounter: Payer: Self-pay | Admitting: General Surgery

## 2020-08-27 VITALS — BP 122/83 | HR 67 | Temp 98.4°F | Resp 16 | Ht 65.5 in | Wt 201.0 lb

## 2020-08-27 DIAGNOSIS — N611 Abscess of the breast and nipple: Secondary | ICD-10-CM

## 2020-08-27 NOTE — Progress Notes (Signed)
Subjective:     Teresa Russell  Here for follow-up of drainage of left breast abscess.  Patient states it is draining clear yellow fluid.  It does feel much better.  She denies any fevers. Objective:    BP 122/83   Pulse 67   Temp 98.4 F (36.9 C) (Other (Comment))   Resp 16   Ht 5' 5.5" (1.664 m)   Wt 201 lb (91.2 kg)   SpO2 97%   BMI 32.94 kg/m   General:  alert, cooperative and no distress  Left breast abscess resolving.  Less erythema is noted.     Assessment:    Resolving left breast abscess    Plan:   Patient should continue to keep the wound clean and dry.  She should get a mammogram once this has healed.  Finish antibiotic course.  I told her to return to my care should it not fully resolve in the next few weeks.  She understands and agrees.

## 2020-09-02 ENCOUNTER — Ambulatory Visit: Payer: Federal, State, Local not specified - PPO

## 2020-09-02 ENCOUNTER — Other Ambulatory Visit: Payer: Self-pay

## 2020-09-02 DIAGNOSIS — G4733 Obstructive sleep apnea (adult) (pediatric): Secondary | ICD-10-CM | POA: Diagnosis not present

## 2020-09-02 DIAGNOSIS — G4710 Hypersomnia, unspecified: Secondary | ICD-10-CM

## 2020-09-07 ENCOUNTER — Telehealth: Payer: Self-pay | Admitting: Pulmonary Disease

## 2020-09-07 DIAGNOSIS — G4733 Obstructive sleep apnea (adult) (pediatric): Secondary | ICD-10-CM | POA: Diagnosis not present

## 2020-09-07 NOTE — Telephone Encounter (Signed)
HST showed very mild  OSA with AHI 5/ hr  This does not explain her degree of sleepiness. Hands I would suggest that we go ahead with an attended polysomnogram in our sleep center followed by a nap test to rule out other causes such as narcolepsy. If agreeable, proceed with split NPSG followed by MSLT

## 2020-09-08 NOTE — Telephone Encounter (Signed)
Called and left message on voicemail to please return phone call to go over HST results. Contact number provided.  

## 2020-09-09 ENCOUNTER — Telehealth: Payer: Self-pay | Admitting: Pulmonary Disease

## 2020-09-09 DIAGNOSIS — G4710 Hypersomnia, unspecified: Secondary | ICD-10-CM

## 2020-09-09 NOTE — Telephone Encounter (Signed)
Called and spoke with patient who called to get HST results.   Rigoberto Noel, MD        HST showed very mild  OSA with AHI 5/ hr  This does not explain her degree of sleepiness. Hands I would suggest that we go ahead with an attended polysomnogram in our sleep center followed by a nap test to rule out other causes such as narcolepsy. If agreeable, proceed with split NPSG followed by MSLT      Went over HST results per Dr Elsworth Soho with patient. All questions answered and patient expressed full understanding. Patient agreeable with Dr Bari Mantis recommendations. Orders placed per Dr Elsworth Soho. Nothing further needed at this time.

## 2020-09-09 NOTE — Telephone Encounter (Signed)
See telephone note from today 09/09/20.Marland Kitchenwent over HST results with patient.

## 2020-09-21 ENCOUNTER — Other Ambulatory Visit: Payer: Self-pay | Admitting: Otolaryngology

## 2020-10-16 ENCOUNTER — Encounter (HOSPITAL_BASED_OUTPATIENT_CLINIC_OR_DEPARTMENT_OTHER): Payer: Self-pay | Admitting: Otolaryngology

## 2020-10-16 ENCOUNTER — Other Ambulatory Visit: Payer: Self-pay

## 2020-10-27 ENCOUNTER — Encounter (HOSPITAL_BASED_OUTPATIENT_CLINIC_OR_DEPARTMENT_OTHER)
Admission: RE | Admit: 2020-10-27 | Discharge: 2020-10-27 | Disposition: A | Discharge status: Home / Self Care | Payer: Federal, State, Local not specified - PPO | Source: Ambulatory Visit | Attending: Otolaryngology | Admitting: Otolaryngology

## 2020-10-27 DIAGNOSIS — Z01810 Encounter for preprocedural cardiovascular examination: Secondary | ICD-10-CM | POA: Insufficient documentation

## 2020-10-27 LAB — BASIC METABOLIC PANEL
Anion gap: 11 (ref 5–15)
BUN: 8 mg/dL (ref 6–20)
CO2: 27 mmol/L (ref 22–32)
Calcium: 9.6 mg/dL (ref 8.9–10.3)
Chloride: 100 mmol/L (ref 98–111)
Creatinine, Ser: 0.6 mg/dL (ref 0.44–1.00)
GFR, Estimated: >60 mL/min (ref >60)
Glucose, Bld: 103 mg/dL — ABNORMAL HIGH (ref 70–99)
Potassium: 3.6 mmol/L (ref 3.5–5.1)
Sodium: 138 mmol/L (ref 135–145)

## 2020-10-30 ENCOUNTER — Other Ambulatory Visit: Payer: Self-pay | Admitting: Internal Medicine

## 2020-10-30 DIAGNOSIS — I10 Essential (primary) hypertension: Secondary | ICD-10-CM

## 2020-11-03 ENCOUNTER — Other Ambulatory Visit: Payer: Self-pay

## 2020-11-03 ENCOUNTER — Telehealth: Payer: Self-pay

## 2020-11-03 ENCOUNTER — Ambulatory Visit (HOSPITAL_BASED_OUTPATIENT_CLINIC_OR_DEPARTMENT_OTHER)
Admission: RE | Admit: 2020-11-03 | Payer: Federal, State, Local not specified - PPO | Source: Home / Self Care | Admitting: Otolaryngology

## 2020-11-03 DIAGNOSIS — I10 Essential (primary) hypertension: Secondary | ICD-10-CM

## 2020-11-03 HISTORY — DX: Type 2 diabetes mellitus without complications: E11.9

## 2020-11-03 HISTORY — DX: Sleep apnea, unspecified: G47.30

## 2020-11-03 SURGERY — SEPTOPLASTY, NOSE, WITH NASAL TURBINATE REDUCTION
Anesthesia: General

## 2020-11-03 MED ORDER — LISINOPRIL-HYDROCHLOROTHIAZIDE 10-12.5 MG PO TABS: 1.0000 | ORAL_TABLET | Freq: Every day | ORAL | 0 refills | Status: DC

## 2020-11-03 NOTE — Telephone Encounter (Signed)
Prescription was already sent in

## 2020-11-03 NOTE — Telephone Encounter (Signed)
Med refill lisinopril-hydrochlorothiazide (ZESTORETIC) 10-12.5 MG tablet  [facility]

## 2020-11-03 NOTE — Telephone Encounter (Signed)
Rx sent 

## 2020-11-10 ENCOUNTER — Ambulatory Visit (HOSPITAL_BASED_OUTPATIENT_CLINIC_OR_DEPARTMENT_OTHER): Payer: Federal, State, Local not specified - PPO | Admitting: Pulmonary Disease

## 2020-11-11 ENCOUNTER — Encounter (HOSPITAL_BASED_OUTPATIENT_CLINIC_OR_DEPARTMENT_OTHER): Payer: Federal, State, Local not specified - PPO | Admitting: Pulmonary Disease

## 2020-11-18 ENCOUNTER — Encounter: Payer: Federal, State, Local not specified - PPO | Admitting: Internal Medicine

## 2020-11-24 ENCOUNTER — Other Ambulatory Visit: Payer: Self-pay

## 2020-11-24 ENCOUNTER — Telehealth: Payer: Self-pay

## 2020-11-24 MED ORDER — METFORMIN HCL ER 500 MG PO TB24
500.0000 mg | ORAL_TABLET | Freq: Every day | ORAL | 0 refills | Status: DC
Start: 2020-11-24 — End: 2021-03-03

## 2020-11-24 NOTE — Telephone Encounter (Signed)
Med refilled.

## 2020-11-24 NOTE — Telephone Encounter (Signed)
Patient called need med refill metFORMIN (GLUCOPHAGE-XR) 500 MG 24 hr tablet  [facility]

## 2020-12-10 ENCOUNTER — Encounter: Payer: Federal, State, Local not specified - PPO | Admitting: Internal Medicine

## 2020-12-15 ENCOUNTER — Other Ambulatory Visit: Payer: Self-pay

## 2020-12-15 ENCOUNTER — Ambulatory Visit (HOSPITAL_BASED_OUTPATIENT_CLINIC_OR_DEPARTMENT_OTHER): Payer: Federal, State, Local not specified - PPO | Attending: Pulmonary Disease | Admitting: Pulmonary Disease

## 2020-12-15 DIAGNOSIS — G4710 Hypersomnia, unspecified: Secondary | ICD-10-CM | POA: Insufficient documentation

## 2020-12-15 DIAGNOSIS — G4733 Obstructive sleep apnea (adult) (pediatric): Secondary | ICD-10-CM | POA: Diagnosis not present

## 2020-12-16 ENCOUNTER — Encounter (HOSPITAL_BASED_OUTPATIENT_CLINIC_OR_DEPARTMENT_OTHER): Payer: Federal, State, Local not specified - PPO | Admitting: Pulmonary Disease

## 2020-12-24 ENCOUNTER — Telehealth: Payer: Self-pay | Admitting: Pulmonary Disease

## 2020-12-24 NOTE — Telephone Encounter (Signed)
LMTCB  HST was done 12/21/20. Please allow physician two weeks. If you have not heard anything in a 2 week time span give [place] a call back.

## 2020-12-30 ENCOUNTER — Ambulatory Visit (INDEPENDENT_AMBULATORY_CARE_PROVIDER_SITE_OTHER): Payer: Federal, State, Local not specified - PPO | Admitting: Internal Medicine

## 2020-12-30 ENCOUNTER — Other Ambulatory Visit: Payer: Self-pay

## 2020-12-30 ENCOUNTER — Encounter: Payer: Self-pay | Admitting: Internal Medicine

## 2020-12-30 VITALS — BP 131/86 | HR 91 | Temp 98.1°F | Resp 16 | Ht 65.5 in | Wt 201.0 lb

## 2020-12-30 DIAGNOSIS — L309 Dermatitis, unspecified: Secondary | ICD-10-CM | POA: Insufficient documentation

## 2020-12-30 DIAGNOSIS — K219 Gastro-esophageal reflux disease without esophagitis: Secondary | ICD-10-CM | POA: Insufficient documentation

## 2020-12-30 DIAGNOSIS — E119 Type 2 diabetes mellitus without complications: Secondary | ICD-10-CM

## 2020-12-30 DIAGNOSIS — Z2821 Immunization not carried out because of patient refusal: Secondary | ICD-10-CM

## 2020-12-30 DIAGNOSIS — I10 Essential (primary) hypertension: Secondary | ICD-10-CM | POA: Diagnosis not present

## 2020-12-30 DIAGNOSIS — E782 Mixed hyperlipidemia: Secondary | ICD-10-CM

## 2020-12-30 DIAGNOSIS — Z1159 Encounter for screening for other viral diseases: Secondary | ICD-10-CM

## 2020-12-30 DIAGNOSIS — Z114 Encounter for screening for human immunodeficiency virus [HIV]: Secondary | ICD-10-CM

## 2020-12-30 DIAGNOSIS — Z0001 Encounter for general adult medical examination with abnormal findings: Secondary | ICD-10-CM | POA: Diagnosis not present

## 2020-12-30 DIAGNOSIS — Z1231 Encounter for screening mammogram for malignant neoplasm of breast: Secondary | ICD-10-CM

## 2020-12-30 MED ORDER — TRIAMCINOLONE ACETONIDE 0.1 % EX CREA: 1.0000 "application " | TOPICAL_CREAM | Freq: Two times a day (BID) | CUTANEOUS | 0 refills | Status: DC

## 2020-12-30 MED ORDER — OMEPRAZOLE 20 MG PO CPDR: 20.0000 mg | DELAYED_RELEASE_CAPSULE | Freq: Every day | ORAL | 3 refills | Status: DC | PRN

## 2020-12-30 NOTE — Assessment & Plan Note (Signed)
BP Readings from Last 1 Encounters:  12/30/20 131/86   Well-controlled with Lisinopril-HCTZ Counseled for compliance with the medications Advised DASH diet and moderate exercise/walking, at least 150 mins/week

## 2020-12-30 NOTE — Assessment & Plan Note (Signed)
On Metformin 500 mg QD Advised to follow diabetic diet F/u CMP and lipid panel On statin and ACEi Diabetic eye exam: Advised to follow up with Ophthalmology for diabetic eye exam

## 2020-12-30 NOTE — Patient Instructions (Signed)
Health Maintenance, Female Adopting a healthy lifestyle and getting preventive care are important in promoting health and wellness. Ask your health care provider about: The right schedule for you to have regular tests and exams. Things you can do on your own to prevent diseases and keep yourself healthy. What should I know about diet, weight, and exercise? Eat a healthy diet  Eat a diet that includes plenty of vegetables, fruits, low-fat dairy products, and lean protein. Do not eat a lot of foods that are high in solid fats, added sugars, or sodium. Maintain a healthy weight Body mass index (BMI) is used to identify weight problems. It estimates body fat based on height and weight. Your health care provider can help determine your BMI and help you achieve or maintain a healthy weight. Get regular exercise Get regular exercise. This is one of the most important things you can do for your health. Most adults should: Exercise for at least 150 minutes each week. The exercise should increase your heart rate and make you sweat (moderate-intensity exercise). Do strengthening exercises at least twice a week. This is in addition to the moderate-intensity exercise. Spend less time sitting. Even light physical activity can be beneficial. Watch cholesterol and blood lipids Have your blood tested for lipids and cholesterol at 49 years of age, then have this test every 5 years. Have your cholesterol levels checked more often if: Your lipid or cholesterol levels are high. You are older than 49 years of age. You are at high risk for heart disease. What should I know about cancer screening? Depending on your health history and family history, you may need to have cancer screening at various ages. This may include screening for: Breast cancer. Cervical cancer. Colorectal cancer. Skin cancer. Lung cancer. What should I know about heart disease, diabetes, and high blood pressure? Blood pressure and heart  disease High blood pressure causes heart disease and increases the risk of stroke. This is more likely to develop in people who have high blood pressure readings, are of African descent, or are overweight. Have your blood pressure checked: Every 3-5 years if you are 18-39 years of age. Every year if you are 40 years old or older. Diabetes Have regular diabetes screenings. This checks your fasting blood sugar level. Have the screening done: Once every three years after age 40 if you are at a normal weight and have a low risk for diabetes. More often and at a younger age if you are overweight or have a high risk for diabetes. What should I know about preventing infection? Hepatitis B If you have a higher risk for hepatitis B, you should be screened for this virus. Talk with your health care provider to find out if you are at risk for hepatitis B infection. Hepatitis C Testing is recommended for: Everyone born from 1945 through 1965. Anyone with known risk factors for hepatitis C. Sexually transmitted infections (STIs) Get screened for STIs, including gonorrhea and chlamydia, if: You are sexually active and are younger than 49 years of age. You are older than 49 years of age and your health care provider tells you that you are at risk for this type of infection. Your sexual activity has changed since you were last screened, and you are at increased risk for chlamydia or gonorrhea. Ask your health care provider if you are at risk. Ask your health care provider about whether you are at high risk for HIV. Your health care provider may recommend a prescription medicine   to help prevent HIV infection. If you choose to take medicine to prevent HIV, you should first get tested for HIV. You should then be tested every 3 months for as long as you are taking the medicine. Pregnancy If you are about to stop having your period (premenopausal) and you may become pregnant, seek counseling before you get  pregnant. Take 400 to 800 micrograms (mcg) of folic acid every day if you become pregnant. Ask for birth control (contraception) if you want to prevent pregnancy. Osteoporosis and menopause Osteoporosis is a disease in which the bones lose minerals and strength with aging. This can result in bone fractures. If you are 65 years old or older, or if you are at risk for osteoporosis and fractures, ask your health care provider if you should: Be screened for bone loss. Take a calcium or vitamin D supplement to lower your risk of fractures. Be given hormone replacement therapy (HRT) to treat symptoms of menopause. Follow these instructions at home: Lifestyle Do not use any products that contain nicotine or tobacco, such as cigarettes, e-cigarettes, and chewing tobacco. If you need help quitting, ask your health care provider. Do not use street drugs. Do not share needles. Ask your health care provider for help if you need support or information about quitting drugs. Alcohol use Do not drink alcohol if: Your health care provider tells you not to drink. You are pregnant, may be pregnant, or are planning to become pregnant. If you drink alcohol: Limit how much you use to 0-1 drink a day. Limit intake if you are breastfeeding. Be aware of how much alcohol is in your drink. In the [place] one drink equals one 12 oz bottle of beer (355 mL), one 5 oz glass of wine (148 mL), or one 1 oz glass of hard liquor (44 mL). General instructions Schedule regular health, dental, and eye exams. Stay current with your vaccines. Tell your health care provider if: You often feel depressed. You have ever been abused or do not feel safe at home. Summary Adopting a healthy lifestyle and getting preventive care are important in promoting health and wellness. Follow your health care provider's instructions about healthy diet, exercising, and getting tested or screened for diseases. Follow your health care provider's  instructions on monitoring your cholesterol and blood pressure. This information is not intended to replace advice given to you by your health care provider. Make sure you discuss any questions you have with your health care provider. Document Revised: 06/26/2020 Document Reviewed: 04/11/2018 Elsevier Patient Education  2022 [employer]  

## 2020-12-30 NOTE — Assessment & Plan Note (Signed)
Belching and chest tightness after eating could be related to gastritis/GERD Started Omeprazole Takes Simethicone PRN for gas relief

## 2020-12-30 NOTE — Assessment & Plan Note (Signed)
Annual exam as documented. Counseling done  re healthy lifestyle involving commitment to 150 minutes exercise per week, heart healthy diet, and attaining healthy weight.The importance of adequate sleep also discussed. Changes in health habits are decided on by the patient with goals and time frames  set for achieving them. Immunization and cancer screening needs are specifically addressed at this visit. 

## 2020-12-30 NOTE — Assessment & Plan Note (Signed)
Patch on elbow, isolated Trial of triamcinolone Advised if she has no improvement with steroid or any new patch, will have to evaluate for Psoriasis

## 2020-12-30 NOTE — Progress Notes (Signed)
Established Patient Office Visit  Subjective:  Patient ID: Teresa Russell, female    DOB: 1970/11/05  Age: 50 y.o. MRN: [id number]  CC:  Chief Complaint  Patient presents with   Annual Exam    Annual exam pt has spot on left elbow has been there for awhile doesn't hurt she just knows that its there     HPI Teresa Russell  is a 50 year old female with PMH of HTN, DM, allergic rhinitis and obesity who presents for annual physical.  HTN: BP is well-controlled. Takes medications regularly. Patient denies headache, dizziness, chest pain, dyspnea or palpitations.  DM: She is on Metformin currently. Denies any polyuria or polydipsia.  She had sleep study recently. Has not had results yet.  She c/o a rash over elbow region, which has been present for a long time. She denies any itching or scaling of the area.  She c/o intermittent chest tightness and belching after eating. Denies any dysphagia or odynophagia. She has been taking Simethicone for gas relief.  She declined flu vaccine today.  Past Medical History:  Diagnosis Date   Arthritis    wrists and right hip, right knee   Diabetes mellitus without complication (HCC)    GERD (gastroesophageal reflux disease)    Hyperlipidemia    Hypertension    Pneumonia    Sleep apnea     Past Surgical History:  Procedure Laterality Date   ABDOMINAL HYSTERECTOMY  2010   for heavy bleeding   COLONOSCOPY N/A 10/14/2014   Procedure: COLONOSCOPY;  Surgeon: Danie Binder, MD;  Location: [facility];  Service: Endoscopy;  Laterality: N/A;  115 - moved to 11:15 - office notified pt    Family History  Problem Relation Age of Onset   Colon cancer Mother 27       passed away age 53   Breast cancer Maternal Aunt        currently treating    Social History   Socioeconomic History   Marital status: Single    Spouse name: Not on file   Number of children: Not on file   Years of education: Not on file   Highest education level: Not  on file  Occupational History   Not on file  Tobacco Use   Smoking status: Former    Types: Cigarettes    Quit date: 05/02/2012    Years since quitting: 8.6   Smokeless tobacco: Never  Vaping Use   Vaping Use: Never used  Substance and Sexual Activity   Alcohol use: Not Currently    Comment: Last drink 2012; used to drink a case of beer every Friday   Drug use: No   Sexual activity: Yes  Other Topics Concern   Not on file  Social History Narrative   Not on file   Social Determinants of Health   Financial Resource Strain: Not on file  Food Insecurity: Not on file  Transportation Needs: Not on file  Physical Activity: Not on file  Stress: Not on file  Social Connections: Not on file  Intimate Partner Violence: Not on file    Outpatient Medications Prior to Visit  Medication Sig Dispense Refill   calcium-vitamin D (OSCAL WITH D) 250-125 MG-UNIT tablet Take 1 tablet by mouth daily.     fluticasone (FLONASE) 50 MCG/ACT nasal spray Place 2 sprays into both nostrils daily.     ibuprofen (ADVIL,MOTRIN) 200 MG tablet Take 400 mg by mouth daily.      lisinopril-hydrochlorothiazide (  ZESTORETIC) 10-12.5 MG tablet Take 1 tablet by mouth daily. 90 tablet 0   LYSINE ACETATE PO Take by mouth.     metFORMIN (GLUCOPHAGE-XR) 500 MG 24 hr tablet Take 1 tablet (500 mg total) by mouth daily with breakfast. 90 tablet 0   Multiple Vitamins-Minerals (EMERGEN-C VITAMIN C PO) Take 1 packet by mouth daily.     nystatin (MYCOSTATIN/NYSTOP) powder Apply 1 application topically 3 (three) times daily. 15 g 0   Omega-3 1000 MG CAPS Take by mouth.     rosuvastatin (CRESTOR) 10 MG tablet Take 1 tablet (10 mg total) by mouth daily. 90 tablet 1   Simethicone (PHAZYME PO) Take 1 capsule by mouth daily.     Turmeric 400 MG CAPS Take by mouth.     zinc gluconate 50 MG tablet Take 50 mg by mouth daily.     No facility-administered medications prior to visit.    Allergies  Allergen Reactions   Wellbutrin  [Bupropion] Other (See Comments)    Spinal pain    ROS Review of Systems  Constitutional:  Negative for chills and fever.  HENT:  Negative for congestion, sinus pressure, sinus pain and sore throat.   Eyes:  Negative for pain and discharge.  Respiratory:  Negative for cough and shortness of breath.   Cardiovascular:  Negative for chest pain and palpitations.  Gastrointestinal:  Negative for abdominal pain, constipation, diarrhea, nausea and vomiting.  Endocrine: Negative for polydipsia and polyuria.  Genitourinary:  Negative for dysuria and hematuria.  Musculoskeletal:  Negative for neck pain and neck stiffness.  Skin:  Positive for rash.  Neurological:  Negative for dizziness and weakness.  Psychiatric/Behavioral:  Negative for agitation and behavioral problems.      Objective:    Physical Exam Vitals reviewed.  Constitutional:      General: She is not in acute distress.    Appearance: She is not diaphoretic.  HENT:     Head: Normocephalic and atraumatic.     Nose: No congestion.     Comments: DNS    Mouth/Throat:     Mouth: Mucous membranes are dry.  Eyes:     General: No scleral icterus.    Extraocular Movements: Extraocular movements intact.  Cardiovascular:     Rate and Rhythm: Normal rate and regular rhythm.     Pulses: Normal pulses.     Heart sounds: Normal heart sounds. No murmur heard. Pulmonary:     Breath sounds: Normal breath sounds. No wheezing or rales.  Abdominal:     Palpations: Abdomen is soft.     Tenderness: There is no abdominal tenderness.  Musculoskeletal:     Cervical back: Neck supple. No tenderness.     Right lower leg: No edema.     Left lower leg: No edema.  Skin:    General: Skin is warm.     Findings: Rash (Whitish patch over extensor surface of elbow region) present.  Neurological:     General: No focal deficit present.     Mental Status: She is alert and oriented to person, place, and time.     Sensory: No sensory deficit.      Motor: No weakness.  Psychiatric:        Mood and Affect: Mood normal.        Behavior: Behavior normal.    BP 131/86 (BP Location: Left Arm, Patient Position: Sitting, Cuff Size: Normal)   Pulse 91   Temp 98.1 F (36.7 C) (Oral)   Resp 16  Ht 5' 5.5" (1.664 m)   Wt 201 lb 0.6 oz (91.2 kg)   SpO2 96%   BMI 32.95 kg/m  Wt Readings from Last 3 Encounters:  12/30/20 201 lb 0.6 oz (91.2 kg)  12/15/20 193 lb (87.5 kg)  08/27/20 201 lb (91.2 kg)     Health Maintenance Due  Topic Date Due   Pneumococcal Vaccine 19-48 Years old (1 - PCV) Never done   FOOT EXAM  Never done   Hepatitis C Screening  Never done    There are no preventive care reminders to display for this patient.  Lab Results  Component Value Date   TSH 1.070 07/15/2020   Lab Results  Component Value Date   WBC 6.1 07/15/2020   HGB 14.0 07/15/2020   HCT 40.1 07/15/2020   MCV 84 07/15/2020   PLT 309 07/15/2020   Lab Results  Component Value Date   NA 138 10/27/2020   K 3.6 10/27/2020   CO2 27 10/27/2020   GLUCOSE 103 (H) 10/27/2020   BUN 8 10/27/2020   CREATININE 0.60 10/27/2020   BILITOT 0.5 07/15/2020   ALKPHOS 61 07/15/2020   AST 22 07/15/2020   ALT 21 07/15/2020   PROT 6.9 07/15/2020   ALBUMIN 4.5 07/15/2020   CALCIUM 9.6 10/27/2020   ANIONGAP 11 10/27/2020   EGFR 101 07/15/2020   Lab Results  Component Value Date   CHOL 262 (H) 07/15/2020   Lab Results  Component Value Date   HDL 54 07/15/2020   Lab Results  Component Value Date   LDLCALC 156 (H) 07/15/2020   Lab Results  Component Value Date   TRIG 284 (H) 07/15/2020   Lab Results  Component Value Date   CHOLHDL 4.9 (H) 07/15/2020   Lab Results  Component Value Date   HGBA1C 6.6 (H) 07/15/2020      Assessment & Plan:   Problem List Items Addressed This Visit       Cardiovascular and Mediastinum   Hypertension    BP Readings from Last 1 Encounters:  12/30/20 131/86  Well-controlled with  Lisinopril-HCTZ Counseled for compliance with the medications Advised DASH diet and moderate exercise/walking, at least 150 mins/week         Digestive   Gastroesophageal reflux disease without esophagitis    Belching and chest tightness after eating could be related to gastritis/GERD Started Omeprazole Takes Simethicone PRN for gas relief      Relevant Medications   omeprazole (PRILOSEC) 20 MG capsule     Endocrine   DM (diabetes mellitus) ([place])    On Metformin 500 mg QD Advised to follow diabetic diet F/u CMP and lipid panel On statin and ACEi Diabetic eye exam: Advised to follow up with Ophthalmology for diabetic eye exam       Relevant Orders   Hemoglobin A1c   Lipid panel     Musculoskeletal and Integument   Eczema    Patch on elbow, isolated Trial of triamcinolone Advised if she has no improvement with steroid or any new patch, will have to evaluate for Psoriasis      Relevant Medications   triamcinolone cream (KENALOG) 0.1 %     Other   Encounter for general adult medical examination with abnormal findings - Primary    Annual exam as documented. Counseling done  re healthy lifestyle involving commitment to 150 minutes exercise per week, heart healthy diet, and attaining healthy weight.The importance of adequate sleep also discussed. Changes in health habits are decided on  by the patient with goals and time frames  set for achieving them. Immunization and cancer screening needs are specifically addressed at this visit.      Relevant Orders   CBC with Differential   Comprehensive metabolic panel   Lipid panel   TSH   Other Visit Diagnoses     Mixed hyperlipidemia       Relevant Orders   Lipid panel   Need for hepatitis C screening test       Relevant Orders   Hepatitis C antibody screen   Encounter for screening for HIV       Relevant Orders   HIV antibody   Refused influenza vaccine       Screening mammogram for breast cancer       Relevant  Orders   MM 3D SCREEN BREAST BILATERAL       Meds ordered this encounter  Medications   omeprazole (PRILOSEC) 20 MG capsule    Sig: Take 1 capsule (20 mg total) by mouth daily as needed.    Dispense:  30 capsule    Refill:  3   triamcinolone cream (KENALOG) 0.1 %    Sig: Apply 1 application topically 2 (two) times daily.    Dispense:  30 g    Refill:  0    Follow-up: No follow-ups on file.    Lindell Spar, MD

## 2020-12-31 LAB — CBC WITH DIFFERENTIAL/PLATELET
Basophils Absolute: 0.1 10*3/uL (ref 0.0–0.2)
Basos: 1 %
EOS (ABSOLUTE): 0.3 10*3/uL (ref 0.0–0.4)
Eos: 5 %
Hematocrit: 40.4 % (ref 34.0–46.6)
Hemoglobin: 13.9 g/dL (ref 11.1–15.9)
Immature Grans (Abs): 0 10*3/uL (ref 0.0–0.1)
Immature Granulocytes: 1 %
Lymphocytes Absolute: 3 10*3/uL (ref 0.7–3.1)
Lymphs: 51 %
MCH: 28.7 pg (ref 26.6–33.0)
MCHC: 34.4 g/dL (ref 31.5–35.7)
MCV: 83 fL (ref 79–97)
Monocytes Absolute: 0.4 10*3/uL (ref 0.1–0.9)
Monocytes: 7 %
Neutrophils Absolute: 2 10*3/uL (ref 1.4–7.0)
Neutrophils: 35 %
Platelets: 283 10*3/uL (ref 150–450)
RBC: 4.85 x10E6/uL (ref 3.77–5.28)
RDW: 11.8 % (ref 11.7–15.4)
WBC: 5.8 10*3/uL (ref 3.4–10.8)

## 2020-12-31 LAB — COMPREHENSIVE METABOLIC PANEL
ALT: 29 IU/L (ref 0–32)
AST: 23 IU/L (ref 0–40)
Albumin/Globulin Ratio: 2.3 — ABNORMAL HIGH (ref 1.2–2.2)
Albumin: 4.8 g/dL (ref 3.8–4.8)
Alkaline Phosphatase: 83 IU/L (ref 44–121)
BUN/Creatinine Ratio: 16 (ref 9–23)
BUN: 9 mg/dL (ref 6–24)
Bilirubin Total: 0.3 mg/dL (ref 0.0–1.2)
CO2: 21 mmol/L (ref 20–29)
Calcium: 10.1 mg/dL (ref 8.7–10.2)
Chloride: 103 mmol/L (ref 96–106)
Creatinine, Ser: 0.55 mg/dL — ABNORMAL LOW (ref 0.57–1.00)
Globulin, Total: 2.1 g/dL (ref 1.5–4.5)
Glucose: 142 mg/dL — ABNORMAL HIGH (ref 65–99)
Potassium: 4.2 mmol/L (ref 3.5–5.2)
Sodium: 140 mmol/L (ref 134–144)
Total Protein: 6.9 g/dL (ref 6.0–8.5)
eGFR: 112 mL/min/{1.73_m2} (ref >59)

## 2020-12-31 LAB — HEMOGLOBIN A1C
Est. average glucose Bld gHb Est-mCnc: 131 mg/dL
Hgb A1c MFr Bld: 6.2 % — ABNORMAL HIGH (ref 4.8–5.6)

## 2020-12-31 LAB — LIPID PANEL
Chol/HDL Ratio: 2.9 ratio (ref 0.0–4.4)
Cholesterol, Total: 166 mg/dL (ref 100–199)
HDL: 57 mg/dL (ref >39)
LDL Chol Calc (NIH): 82 mg/dL (ref 0–99)
Triglycerides: 156 mg/dL — ABNORMAL HIGH (ref 0–149)
VLDL Cholesterol Cal: 27 mg/dL (ref 5–40)

## 2020-12-31 LAB — HEPATITIS C ANTIBODY: Hep C Virus Ab: <0.1 s/co ratio (ref 0.0–0.9)

## 2020-12-31 LAB — TSH: TSH: 1.19 u[IU]/mL (ref 0.450–4.500)

## 2020-12-31 LAB — HIV ANTIBODY (ROUTINE TESTING W REFLEX): HIV Screen 4th Generation wRfx: NONREACTIVE

## 2021-01-05 DIAGNOSIS — G4710 Hypersomnia, unspecified: Secondary | ICD-10-CM

## 2021-01-05 DIAGNOSIS — G4733 Obstructive sleep apnea (adult) (pediatric): Secondary | ICD-10-CM

## 2021-01-05 NOTE — Telephone Encounter (Signed)
NPSG showed mild OSA with AHI 12/h  auto CPAP 5-15 cm can be tried with medium full face mask -OV 30-90 days after starting - If hypersomnolence persists after 1-2 months of CPAP use, can proceed with nap test 9MSLT) to r/o narcolepsy.

## 2021-01-05 NOTE — Procedures (Signed)
Patient Name: Teresa Russell, Teresa Russell Date: 12/15/2020 Gender: Female D.O.B: 24-Nov-1970 Age (years): 66 Referring Provider: Kara Mead MD, ABSM Height (inches): 65 Interpreting Physician: Kara Mead MD, ABSM Weight (lbs): 193 RPSGT: Carolin Coy BMI: 32 MRN: [id number] Neck Size: 17.00 <br> <br> CLINICAL INFORMATION Sleep Study Type: NPSG    Indication for sleep study: Diabetes, Fatigue, Hypertension, Obesity, Snoring    Epworth Sleepiness Score: 13    SLEEP STUDY TECHNIQUE As per the AASM Manual for the Scoring of Sleep and Associated Events v2.3 (April 2016) with a hypopnea requiring 4% desaturations.  The channels recorded and monitored were frontal, central and occipital EEG, electrooculogram (EOG), submentalis EMG (chin), nasal and oral airflow, thoracic and abdominal wall motion, anterior tibialis EMG, snore microphone, electrocardiogram, and pulse oximetry.  MEDICATIONS Medications self-administered by patient taken the night of the study : N/A  SLEEP ARCHITECTURE The study was initiated at 10:13:16 PM and ended at 5:55:38 AM.  Sleep onset time was 0.8 minutes and the sleep efficiency was 89.0%%. The total sleep time was 411.5 minutes.  Stage REM latency was 58.5 minutes.  The patient spent 8.9%% of the night in stage N1 sleep, 65.4%% in stage N2 sleep, 0.0%% in stage N3 and 25.8% in REM.  Alpha intrusion was absent.  Supine sleep was 31.81%.  RESPIRATORY PARAMETERS The overall apnea/hypopnea index (AHI) was 8.2 per hour. There were 0 total apneas, including 0 obstructive, 0 central and 0 mixed apneas. There were 56 hypopneas and 28 RERAs.  The AHI during Stage REM sleep was 25.5 per hour.  AHI while supine was 10.1 per hour.  The mean oxygen saturation was 94.1%. The minimum SpO2 during sleep was 87.0%.  soft snoring was noted during this study.  CARDIAC DATA The 2 lead EKG demonstrated sinus rhythm. The mean heart rate was 71.0 beats per minute.  Other EKG findings include: PVCs.  LEG MOVEMENT DATA The total PLMS were 0 with a resulting PLMS index of 0.0. Associated arousal with leg movement index was 0.0 .  IMPRESSIONS - Mild obstructive sleep apnea occurred during this study (AHI = 8.2/h). - Mild oxygen desaturation was noted during this study (Min O2 = 87.0%). - The patient snored with soft snoring volume. REM latency was 58 mins - EKG findings include PVCs. - Clinically significant periodic limb movements did not occur during sleep. No significant associated arousals.  DIAGNOSIS - Obstructive Sleep Apnea (G47.33) - Hypersomnolence , short REM latency   RECOMMENDATIONS - Therapeutic CPAP titration to determine optimal pressure required to alleviate sleep disordered breathing. Alternatively auto CPAP can be tried with medium full face mask - If hypersomnolence persists after CPAP use, consider MSLT to r/o narcolepsy. - Avoid alcohol, sedatives and other CNS depressants that may worsen sleep apnea and disrupt normal sleep architecture. - Sleep hygiene should be reviewed to assess factors that may improve sleep quality. - Weight management and regular exercise should be initiated or continued if appropriate.   Kara Mead MD Board Certified in [place]

## 2021-01-06 ENCOUNTER — Encounter: Payer: Self-pay | Admitting: Internal Medicine

## 2021-01-14 NOTE — Telephone Encounter (Signed)
Called and there was no answer- LMTCB 

## 2021-01-15 ENCOUNTER — Other Ambulatory Visit: Payer: Self-pay

## 2021-01-15 ENCOUNTER — Encounter: Payer: Self-pay | Admitting: *Deleted

## 2021-01-15 ENCOUNTER — Ambulatory Visit
Admission: RE | Admit: 2021-01-15 | Discharge: 2021-01-15 | Disposition: A | Discharge status: Home / Self Care | Payer: Federal, State, Local not specified - PPO | Source: Ambulatory Visit | Attending: Internal Medicine | Admitting: Internal Medicine

## 2021-01-15 DIAGNOSIS — Z1231 Encounter for screening mammogram for malignant neoplasm of breast: Secondary | ICD-10-CM | POA: Diagnosis not present

## 2021-01-15 NOTE — Telephone Encounter (Signed)
LMTCB and will mail letter to her per protocol.

## 2021-01-18 ENCOUNTER — Telehealth: Payer: Self-pay | Admitting: Pulmonary Disease

## 2021-01-18 DIAGNOSIS — G4733 Obstructive sleep apnea (adult) (pediatric): Secondary | ICD-10-CM

## 2021-01-18 NOTE — Telephone Encounter (Signed)
Rigoberto Noel, MD    4:12 PM Note NPSG showed mild OSA with AHI 12/h   auto CPAP 5-15 cm can be tried with medium full face mask -OV 30-90 days after starting - If hypersomnolence persists after 1-2 months of CPAP use, can proceed with nap test 9MSLT) to r/o narcolepsy.     Called and spoke with pt to let her know the results of the sleep study.  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

## 2021-01-21 NOTE — Telephone Encounter (Signed)
I called and spoke with patient regarding sleep study. Patient verbalized understanding and I have sent in order for CPAP with preferred settings. I also notified patient that DME company will be in contact and also of the recall and wait times. Patient verbalized understanding, nothing further needed.

## 2021-02-10 ENCOUNTER — Telehealth: Payer: Self-pay | Admitting: Internal Medicine

## 2021-02-10 ENCOUNTER — Other Ambulatory Visit: Payer: Self-pay | Admitting: *Deleted

## 2021-02-10 DIAGNOSIS — I10 Essential (primary) hypertension: Secondary | ICD-10-CM

## 2021-02-10 DIAGNOSIS — E782 Mixed hyperlipidemia: Secondary | ICD-10-CM

## 2021-02-10 MED ORDER — ROSUVASTATIN CALCIUM 10 MG PO TABS: 10.0000 mg | ORAL_TABLET | Freq: Every day | ORAL | 1 refills | Status: DC

## 2021-02-10 MED ORDER — LISINOPRIL-HYDROCHLOROTHIAZIDE 10-12.5 MG PO TABS: 1.0000 | ORAL_TABLET | Freq: Every day | ORAL | 0 refills | Status: DC

## 2021-02-10 NOTE — Telephone Encounter (Signed)
Please send over lisinopril & rosuvastatin (CRESTOR) 10 MG table   To the [facility]

## 2021-02-10 NOTE — Telephone Encounter (Signed)
Pt medication sent to [facility] in [place]

## 2021-03-03 ENCOUNTER — Other Ambulatory Visit: Payer: Self-pay | Admitting: Internal Medicine

## 2021-03-15 ENCOUNTER — Ambulatory Visit: Payer: Federal, State, Local not specified - PPO | Admitting: Internal Medicine

## 2021-03-15 ENCOUNTER — Encounter: Payer: Self-pay | Admitting: Internal Medicine

## 2021-03-15 ENCOUNTER — Other Ambulatory Visit: Payer: Self-pay

## 2021-03-15 VITALS — BP 130/82 | HR 89 | Temp 98.1°F | Resp 18 | Ht 65.5 in | Wt 211.0 lb

## 2021-03-15 DIAGNOSIS — K611 Rectal abscess: Secondary | ICD-10-CM | POA: Diagnosis not present

## 2021-03-15 DIAGNOSIS — K429 Umbilical hernia without obstruction or gangrene: Secondary | ICD-10-CM | POA: Insufficient documentation

## 2021-03-15 MED ORDER — CEPHALEXIN 500 MG PO CAPS: 500.0000 mg | ORAL_CAPSULE | Freq: Three times a day (TID) | ORAL | 0 refills | Status: DC

## 2021-03-15 NOTE — Patient Instructions (Signed)
Please start taking Cephalexin. Keep perineal area clean and dry.

## 2021-03-15 NOTE — Assessment & Plan Note (Addendum)
Mostly benign, but complains of mild discomfort/pain at times Referred to general surgery

## 2021-03-15 NOTE — Assessment & Plan Note (Signed)
Complains of rectal pain and discharge upon compression Started Keflex Advised to perform sitz bath Referred to general surgery for rectal abscess eval, possible I&D

## 2021-03-15 NOTE — Progress Notes (Signed)
Acute Office Visit  Subjective:    Patient ID: Teresa Russell, female    DOB: [birth year], 50 y.o.   MRN: [id number]  Chief Complaint  Patient presents with   Acute Visit    Pt having navel pain noticed 03-08-21 feels like knot there also pt has boil on bottom this appeared 03-10-21     HPI Patient is in today for complaint of noticing a bulge around her umbilicus at times, which causes pain/discomfort upon bending over touching in that area.  She denies any discharge from the umbilicus.  Denies any fever, chills, nausea or vomiting.  She also complains of having rectal pain and discharge, which is intermittent.  Rectal pain is worse while sitting.  She has history of breast abscess, which was drained recently.  Her mother has history of colon cancer, she is advised to follow-up with GI for screening colonoscopy.  Past Medical History:  Diagnosis Date   Arthritis    wrists and right hip, right knee   Diabetes mellitus without complication (HCC)    GERD (gastroesophageal reflux disease)    Hyperlipidemia    Hypertension    Pneumonia    Sleep apnea     Past Surgical History:  Procedure Laterality Date   ABDOMINAL HYSTERECTOMY  2010   for heavy bleeding   COLONOSCOPY N/A 10/14/2014   Procedure: COLONOSCOPY;  Surgeon: Danie Binder, MD;  Location: [facility];  Service: Endoscopy;  Laterality: N/A;  115 - moved to 11:15 - office notified pt    Family History  Problem Relation Age of Onset   Colon cancer Mother 51       passed away age 52   Breast cancer Maternal Aunt        currently treating    Social History   Socioeconomic History   Marital status: Single    Spouse name: Not on file   Number of children: Not on file   Years of education: Not on file   Highest education level: Not on file  Occupational History   Not on file  Tobacco Use   Smoking status: Former    Types: Cigarettes    Quit date: 05/02/2012    Years since quitting: 8.8   Smokeless tobacco:  Never  Vaping Use   Vaping Use: Never used  Substance and Sexual Activity   Alcohol use: Not Currently    Comment: Last drink 2012; used to drink a case of beer every Friday   Drug use: No   Sexual activity: Yes  Other Topics Concern   Not on file  Social History Narrative   Not on file   Social Determinants of Health   Financial Resource Strain: Not on file  Food Insecurity: Not on file  Transportation Needs: Not on file  Physical Activity: Not on file  Stress: Not on file  Social Connections: Not on file  Intimate Partner Violence: Not on file    Outpatient Medications Prior to Visit  Medication Sig Dispense Refill   calcium-vitamin D (OSCAL WITH D) 250-125 MG-UNIT tablet Take 1 tablet by mouth daily.     fluticasone (FLONASE) 50 MCG/ACT nasal spray Place 2 sprays into both nostrils daily.     ibuprofen (ADVIL,MOTRIN) 200 MG tablet Take 400 mg by mouth daily.      lisinopril-hydrochlorothiazide (ZESTORETIC) 10-12.5 MG tablet Take 1 tablet by mouth daily. 90 tablet 0   LYSINE ACETATE PO Take by mouth.     metFORMIN (GLUCOPHAGE-XR) 500 MG  24 hr tablet Take 1 tablet by mouth once daily with breakfast 90 tablet 0   Multiple Vitamins-Minerals (EMERGEN-C VITAMIN C PO) Take 1 packet by mouth daily.     nystatin (MYCOSTATIN/NYSTOP) powder Apply 1 application topically 3 (three) times daily. 15 g 0   Omega-3 1000 MG CAPS Take by mouth.     omeprazole (PRILOSEC) 20 MG capsule Take 1 capsule (20 mg total) by mouth daily as needed. 30 capsule 3   rosuvastatin (CRESTOR) 10 MG tablet Take 1 tablet (10 mg total) by mouth daily. 90 tablet 1   Simethicone (PHAZYME PO) Take 1 capsule by mouth daily.     triamcinolone cream (KENALOG) 0.1 % Apply 1 application topically 2 (two) times daily. 30 g 0   Turmeric 400 MG CAPS Take by mouth.     zinc gluconate 50 MG tablet Take 50 mg by mouth daily.     No facility-administered medications prior to visit.    Allergies  Allergen Reactions    Wellbutrin [Bupropion] Other (See Comments)    Spinal pain    Review of Systems  Constitutional:  Negative for chills and fever.  HENT:  Negative for congestion, sinus pressure, sinus pain and sore throat.   Eyes:  Negative for pain and discharge.  Respiratory:  Negative for cough and shortness of breath.   Cardiovascular:  Negative for chest pain and palpitations.  Gastrointestinal:  Positive for rectal pain. Negative for abdominal pain, diarrhea, nausea and vomiting.  Endocrine: Negative for polydipsia and polyuria.  Genitourinary:  Negative for dysuria and hematuria.  Musculoskeletal:  Negative for neck pain and neck stiffness.  Skin:  Positive for rash.  Neurological:  Negative for dizziness and weakness.  Psychiatric/Behavioral:  Negative for agitation and behavioral problems.       Objective:    Physical Exam Vitals reviewed.  Constitutional:      General: She is not in acute distress.    Appearance: She is not diaphoretic.  HENT:     Head: Normocephalic and atraumatic.     Nose: No congestion.     Comments: DNS    Mouth/Throat:     Mouth: Mucous membranes are dry.  Eyes:     General: No scleral icterus.    Extraocular Movements: Extraocular movements intact.  Cardiovascular:     Rate and Rhythm: Normal rate and regular rhythm.     Pulses: Normal pulses.     Heart sounds: Normal heart sounds. No murmur heard. Pulmonary:     Breath sounds: Normal breath sounds. No wheezing or rales.  Abdominal:     Palpations: Abdomen is soft.     Tenderness: There is no abdominal tenderness.     Hernia: A hernia (Umbilical) is present.  Musculoskeletal:     Cervical back: Neck supple. No tenderness.     Right lower leg: No edema.     Left lower leg: No edema.  Skin:    General: Skin is warm.     Findings: Rash (Whitish patch over extensor surface of elbow region) present.  Neurological:     General: No focal deficit present.     Mental Status: She is alert and oriented to  person, place, and time.     Sensory: No sensory deficit.     Motor: No weakness.  Psychiatric:        Mood and Affect: Mood normal.        Behavior: Behavior normal.    BP 130/82 (BP Location: Left Arm, Patient  Position: Sitting, Cuff Size: Normal)   Pulse 89   Temp 98.1 F (36.7 C) (Oral)   Resp 18   Ht 5' 5.5" (1.664 m)   Wt 211 lb 0.6 oz (95.7 kg)   SpO2 97%   BMI 34.58 kg/m  Wt Readings from Last 3 Encounters:  03/15/21 211 lb 0.6 oz (95.7 kg)  12/30/20 201 lb 0.6 oz (91.2 kg)  12/15/20 193 lb (87.5 kg)        Assessment & Plan:   Problem List Items Addressed This Visit       Digestive   Rectal abscess    Complains of rectal pain and discharge upon compression Started Keflex Advised to perform sitz bath Referred to general surgery for rectal abscess eval, possible I&D      Relevant Medications   cephALEXin (KEFLEX) 500 MG capsule   Other Relevant Orders   Ambulatory referral to General Surgery     Other   Umbilical hernia without obstruction and without gangrene - Primary    Mostly benign, but complains of mild discomfort/pain at times Referred to general surgery      Relevant Orders   Ambulatory referral to General Surgery     Meds ordered this encounter  Medications   cephALEXin (KEFLEX) 500 MG capsule    Sig: Take 1 capsule (500 mg total) by mouth 3 (three) times daily.    Dispense:  15 capsule    Refill:  0     Jolyne Laye Keith Rake, MD

## 2021-03-19 ENCOUNTER — Encounter: Payer: Self-pay | Admitting: Family Medicine

## 2021-03-30 ENCOUNTER — Ambulatory Visit: Payer: Federal, State, Local not specified - PPO | Admitting: General Surgery

## 2021-04-07 ENCOUNTER — Encounter: Payer: Self-pay | Admitting: *Deleted

## 2021-05-19 ENCOUNTER — Other Ambulatory Visit: Payer: Self-pay | Admitting: Internal Medicine

## 2021-05-19 ENCOUNTER — Telehealth: Payer: Self-pay | Admitting: Internal Medicine

## 2021-05-19 DIAGNOSIS — I10 Essential (primary) hypertension: Secondary | ICD-10-CM

## 2021-05-19 NOTE — Telephone Encounter (Signed)
This was sent in today.  

## 2021-05-19 NOTE — Telephone Encounter (Signed)
Pt needs refill on   lisinopril-hydrochlorothiazide (ZESTORETIC) 10-12.5 MG tablet    [facility] [place]

## 2021-06-09 ENCOUNTER — Ambulatory Visit: Payer: Federal, State, Local not specified - PPO | Admitting: Internal Medicine

## 2021-06-09 ENCOUNTER — Encounter: Payer: Self-pay | Admitting: Internal Medicine

## 2021-06-09 ENCOUNTER — Other Ambulatory Visit: Payer: Self-pay

## 2021-06-09 VITALS — BP 128/82 | HR 82 | Resp 18 | Ht 65.5 in | Wt 212.0 lb

## 2021-06-09 DIAGNOSIS — I10 Essential (primary) hypertension: Secondary | ICD-10-CM | POA: Diagnosis not present

## 2021-06-09 DIAGNOSIS — E119 Type 2 diabetes mellitus without complications: Secondary | ICD-10-CM | POA: Diagnosis not present

## 2021-06-09 DIAGNOSIS — Z800 Family history of malignant neoplasm of digestive organs: Secondary | ICD-10-CM | POA: Diagnosis not present

## 2021-06-09 DIAGNOSIS — E669 Obesity, unspecified: Secondary | ICD-10-CM

## 2021-06-09 DIAGNOSIS — J0100 Acute maxillary sinusitis, unspecified: Secondary | ICD-10-CM

## 2021-06-09 DIAGNOSIS — K219 Gastro-esophageal reflux disease without esophagitis: Secondary | ICD-10-CM

## 2021-06-09 DIAGNOSIS — K649 Unspecified hemorrhoids: Secondary | ICD-10-CM

## 2021-06-09 DIAGNOSIS — J309 Allergic rhinitis, unspecified: Secondary | ICD-10-CM

## 2021-06-09 LAB — POCT INFLUENZA A/B
Influenza A, POC: NEGATIVE
Influenza B, POC: NEGATIVE

## 2021-06-09 MED ORDER — WEGOVY 0.25 MG/0.5ML ~~LOC~~ SOAJ: 0.2500 mg | SUBCUTANEOUS | 0 refills | Status: DC

## 2021-06-09 MED ORDER — AMOXICILLIN-POT CLAVULANATE 875-125 MG PO TABS: 1.0000 | ORAL_TABLET | Freq: Two times a day (BID) | ORAL | 0 refills | Status: DC

## 2021-06-09 MED ORDER — FLUTICASONE PROPIONATE 50 MCG/ACT NA SUSP: 2.0000 | Freq: Every day | NASAL | 5 refills | Status: DC

## 2021-06-09 NOTE — Patient Instructions (Addendum)
Please start taking Wegovy as prescribed.  Please continue to take other medications as prescribed.

## 2021-06-09 NOTE — Assessment & Plan Note (Signed)
BP Readings from Last 1 Encounters:  06/09/21 128/82   Well-controlled with Lisinopril-HCTZ Counseled for compliance with the medications Advised DASH diet and moderate exercise/walking, at least 150 mins/week

## 2021-06-09 NOTE — Assessment & Plan Note (Signed)
Chronic, referred to GI

## 2021-06-09 NOTE — Assessment & Plan Note (Signed)
Uses Flonase

## 2021-06-09 NOTE — Progress Notes (Signed)
Established Patient Office Visit  Subjective:  Patient ID: Teresa Russell, female    DOB: 29-Apr-1971  Age: 51 y.o. MRN: [id number]  CC:  Chief Complaint  Patient presents with   Follow-up    5 month follow up pt thinks she has a sinus infection has stuffy nose and dry mouth since 06-07-21 also thinks she needs colonoscopy as she has hemrrhoids     HPI Teresa Russell is a 51 y.o. female with past medical history of HTN, DM, allergic rhinitis and obesity who presents for f/u of her chronic medical conditions.  HTN: BP is well-controlled. Takes medications regularly. Patient denies headache, dizziness, chest pain, dyspnea or palpitations.   DM: She is on Metformin currently. Denies any polyuria or polydipsia.  She complains of nasal congestion and sinus pressure related headache for the last 3 days.  She denies any fever, chills, dyspnea or wheezing currently.  She has tried taking Mucinex with no relief.  She uses Flonase for allergic rhinitis.  She has been gaining weight despite trying to follow a low-carb diet.  She is interested in medical weight loss therapy.  After discussion of treatment options, she agrees to start [facility].  She agrees to continue to follow low-carb diet and perform moderate exercise at least 150 minutes/week.  Past Medical History:  Diagnosis Date   Arthritis    wrists and right hip, right knee   Diabetes mellitus without complication (HCC)    GERD (gastroesophageal reflux disease)    Hyperlipidemia    Hypertension    Pneumonia    Sleep apnea     Past Surgical History:  Procedure Laterality Date   ABDOMINAL HYSTERECTOMY  2010   for heavy bleeding   COLONOSCOPY N/A 10/14/2014   Procedure: COLONOSCOPY;  Surgeon: Danie Binder, MD;  [facility];  Service: Endoscopy;  Laterality: N/A;  115 - moved to 11:15 - office notified pt    Family History  Problem Relation Age of Onset   Colon cancer Mother 17       passed away age 83   Breast  cancer Maternal Aunt        currently treating    Social History   Socioeconomic History   Marital status: Single    Spouse name: Not on file   Number of children: Not on file   Years of education: Not on file   Highest education level: Not on file  Occupational History   Not on file  Tobacco Use   Smoking status: Former    Types: Cigarettes    Quit date: 05/02/2012    Years since quitting: 9.1   Smokeless tobacco: Never  Vaping Use   Vaping Use: Never used  Substance and Sexual Activity   Alcohol use: Not Currently    Comment: Last drink 2012; used to drink a case of beer every Friday   Drug use: No   Sexual activity: Yes  Other Topics Concern   Not on file  Social History Narrative   Not on file   Social Determinants of Health   Financial Resource Strain: Not on file  Food Insecurity: Not on file  Transportation Needs: Not on file  Physical Activity: Not on file  Stress: Not on file  Social Connections: Not on file  Intimate Partner Violence: Not on file    Outpatient Medications Prior to Visit  Medication Sig Dispense Refill   calcium-vitamin D (OSCAL WITH D) 250-125 MG-UNIT tablet Take 1 tablet by  mouth daily.     ibuprofen (ADVIL,MOTRIN) 200 MG tablet Take 400 mg by mouth daily.      lisinopril-hydrochlorothiazide (ZESTORETIC) 10-12.5 MG tablet Take 1 tablet by mouth once daily 90 tablet 0   LYSINE ACETATE PO Take by mouth.     metFORMIN (GLUCOPHAGE-XR) 500 MG 24 hr tablet Take 1 tablet by mouth once daily with breakfast 90 tablet 0   Multiple Vitamins-Minerals (EMERGEN-C VITAMIN C PO) Take 1 packet by mouth daily.     nystatin (MYCOSTATIN/NYSTOP) powder Apply 1 application topically 3 (three) times daily. 15 g 0   Omega-3 1000 MG CAPS Take by mouth.     omeprazole (PRILOSEC) 20 MG capsule Take 1 capsule (20 mg total) by mouth daily as needed. 30 capsule 3   rosuvastatin (CRESTOR) 10 MG tablet Take 1 tablet (10 mg total) by mouth daily. 90 tablet 1    Simethicone (PHAZYME PO) Take 1 capsule by mouth daily.     triamcinolone cream (KENALOG) 0.1 % Apply 1 application topically 2 (two) times daily. 30 g 0   Turmeric 400 MG CAPS Take by mouth.     zinc gluconate 50 MG tablet Take 50 mg by mouth daily.     cephALEXin (KEFLEX) 500 MG capsule Take 1 capsule (500 mg total) by mouth 3 (three) times daily. (Patient not taking: Reported on 06/09/2021) 15 capsule 0   fluticasone (FLONASE) 50 MCG/ACT nasal spray Place 2 sprays into both nostrils daily.     No facility-administered medications prior to visit.    Allergies  Allergen Reactions   Wellbutrin [Bupropion] Other (See Comments)    Spinal pain    ROS Review of Systems  Constitutional:  Negative for chills and fever.  HENT:  Positive for congestion, sinus pressure and sinus pain. Negative for sore throat.   Eyes:  Negative for pain and discharge.  Respiratory:  Negative for cough and shortness of breath.   Cardiovascular:  Negative for chest pain and palpitations.  Gastrointestinal:  Positive for rectal pain. Negative for abdominal pain, diarrhea, nausea and vomiting.  Endocrine: Negative for polydipsia and polyuria.  Genitourinary:  Negative for dysuria and hematuria.  Musculoskeletal:  Negative for neck pain and neck stiffness.  Skin:  Positive for rash.  Neurological:  Negative for dizziness and weakness.  Psychiatric/Behavioral:  Negative for agitation and behavioral problems.      Objective:    Physical Exam Vitals reviewed.  Constitutional:      General: She is not in acute distress.    Appearance: She is not diaphoretic.  HENT:     Head: Normocephalic and atraumatic.     Nose: Congestion present.     Right Sinus: Maxillary sinus tenderness present.     Left Sinus: Maxillary sinus tenderness present.     Comments: DNS    Mouth/Throat:     Mouth: Mucous membranes are dry.  Eyes:     General: No scleral icterus.    Extraocular Movements: Extraocular movements intact.   Cardiovascular:     Rate and Rhythm: Normal rate and regular rhythm.     Pulses: Normal pulses.     Heart sounds: Normal heart sounds. No murmur heard. Pulmonary:     Breath sounds: Normal breath sounds. No wheezing or rales.  Abdominal:     Palpations: Abdomen is soft.     Tenderness: There is no abdominal tenderness.     Hernia: A hernia (Umbilical) is present.  Musculoskeletal:     Cervical back: Neck supple.  No tenderness.     Right lower leg: No edema.     Left lower leg: No edema.  Skin:    General: Skin is warm.     Findings: Rash (Whitish patch over extensor surface of elbow region) present.  Neurological:     General: No focal deficit present.     Mental Status: She is alert and oriented to person, place, and time.     Sensory: No sensory deficit.     Motor: No weakness.  Psychiatric:        Mood and Affect: Mood normal.        Behavior: Behavior normal.    BP 128/82 (BP Location: Right Arm, Patient Position: Sitting, Cuff Size: Normal)    Pulse 82    Resp 18    Ht 5' 5.5" (1.664 m)    Wt 212 lb (96.2 kg)    SpO2 97%    BMI 34.74 kg/m  Wt Readings from Last 3 Encounters:  06/09/21 212 lb (96.2 kg)  03/15/21 211 lb 0.6 oz (95.7 kg)  12/30/20 201 lb 0.6 oz (91.2 kg)    Lab Results  Component Value Date   TSH 1.190 12/30/2020   Lab Results  Component Value Date   WBC 5.8 12/30/2020   HGB 13.9 12/30/2020   HCT 40.4 12/30/2020   MCV 83 12/30/2020   PLT 283 12/30/2020   Lab Results  Component Value Date   NA 140 12/30/2020   K 4.2 12/30/2020   CO2 21 12/30/2020   GLUCOSE 142 (H) 12/30/2020   BUN 9 12/30/2020   CREATININE 0.55 (L) 12/30/2020   BILITOT 0.3 12/30/2020   ALKPHOS 83 12/30/2020   AST 23 12/30/2020   ALT 29 12/30/2020   PROT 6.9 12/30/2020   ALBUMIN 4.8 12/30/2020   CALCIUM 10.1 12/30/2020   ANIONGAP 11 10/27/2020   EGFR 112 12/30/2020   Lab Results  Component Value Date   CHOL 166 12/30/2020   Lab Results  Component Value Date    HDL 57 12/30/2020   Lab Results  Component Value Date   LDLCALC 82 12/30/2020   Lab Results  Component Value Date   TRIG 156 (H) 12/30/2020   Lab Results  Component Value Date   CHOLHDL 2.9 12/30/2020   Lab Results  Component Value Date   HGBA1C 6.2 (H) 12/30/2020      Assessment & Plan:   Problem List Items Addressed This Visit       Cardiovascular and Mediastinum   Hypertension - Primary    BP Readings from Last 1 Encounters:  06/09/21 128/82  Well-controlled with Lisinopril-HCTZ Counseled for compliance with the medications Advised DASH diet and moderate exercise/walking, at least 150 mins/week      Hemorrhoids    Chronic, referred to GI        Respiratory   Allergic rhinitis    Uses Flonase      Relevant Medications   fluticasone (FLONASE) 50 MCG/ACT nasal spray   Other Relevant Orders   POCT Influenza A/B (Completed)     Digestive   Gastroesophageal reflux disease without esophagitis    Well-controlled with omeprazole        Endocrine   DM (diabetes mellitus) ([place])    Lab Results  Component Value Date   HGBA1C 6.2 (H) 12/30/2020   On metformin 500 mg daily Advised to follow diabetic diet On statin and ACEi F/u CMP and lipid panel Diabetic eye exam: Advised to follow up with Ophthalmology for  diabetic eye exam      Relevant Orders   Basic Metabolic Panel (BMET)   Hemoglobin A1c     Other   Family history of colon cancer in mother    Had colonoscopy in 2016 Needs a repeat colonoscopy, advised to f/u with GI      Relevant Orders   Ambulatory referral to Gastroenterology   Obesity (BMI 30-39.9)    Advised to follow low-carb diet and perform moderate exercise at least 150 minutes/week BMI - 34.74, started [facility] for weight loss, plan to increase dose as tolerated      Relevant Medications   Semaglutide-Weight Management (WEGOVY) 0.25 MG/0.5ML SOAJ   Other Visit Diagnoses     Acute non-recurrent maxillary sinusitis Rapid flu  test negative Started empiric Augmentin Continue Flonase for allergies       Relevant Medications   fluticasone (FLONASE) 50 MCG/ACT nasal spray   amoxicillin-clavulanate (AUGMENTIN) 875-125 MG tablet       Meds ordered this encounter  Medications   fluticasone (FLONASE) 50 MCG/ACT nasal spray    Sig: Place 2 sprays into both nostrils daily.    Dispense:  16 g    Refill:  5   Semaglutide-Weight Management (WEGOVY) 0.25 MG/0.5ML SOAJ    Sig: Inject 0.25 mg into the skin every 7 (seven) days.    Dispense:  2 mL    Refill:  0   amoxicillin-clavulanate (AUGMENTIN) 875-125 MG tablet    Sig: Take 1 tablet by mouth 2 (two) times daily.    Dispense:  14 tablet    Refill:  0    Follow-up: Return in about 2 months (around 08/07/2021) for Weight loss.    Lindell Spar, MD

## 2021-06-09 NOTE — Assessment & Plan Note (Signed)
Had colonoscopy in 2016 Needs a repeat colonoscopy, advised to f/u with GI

## 2021-06-09 NOTE — Assessment & Plan Note (Signed)
Well controlled with omeprazole. 

## 2021-06-09 NOTE — Assessment & Plan Note (Addendum)
Advised to follow low-carb diet and perform moderate exercise at least 150 minutes/week BMI - 34.74, started [facility] for weight loss, plan to increase dose as tolerated

## 2021-06-09 NOTE — Assessment & Plan Note (Signed)
Lab Results  Component Value Date   HGBA1C 6.2 (H) 12/30/2020    On metformin 500 mg daily Advised to follow diabetic diet On statin and ACEi F/u CMP and lipid panel Diabetic eye exam: Advised to follow up with Ophthalmology for diabetic eye exam

## 2021-06-09 NOTE — Progress Notes (Signed)
follo

## 2021-06-10 ENCOUNTER — Encounter: Payer: Self-pay | Admitting: Internal Medicine

## 2021-06-10 ENCOUNTER — Other Ambulatory Visit: Payer: Self-pay

## 2021-06-10 DIAGNOSIS — E669 Obesity, unspecified: Secondary | ICD-10-CM

## 2021-06-10 LAB — HEMOGLOBIN A1C
Est. average glucose Bld gHb Est-mCnc: 169 mg/dL
Hgb A1c MFr Bld: 7.5 % — ABNORMAL HIGH (ref 4.8–5.6)

## 2021-06-10 LAB — BASIC METABOLIC PANEL
BUN/Creatinine Ratio: 15 (ref 9–23)
BUN: 10 mg/dL (ref 6–24)
CO2: 21 mmol/L (ref 20–29)
Calcium: 10.1 mg/dL (ref 8.7–10.2)
Chloride: 103 mmol/L (ref 96–106)
Creatinine, Ser: 0.67 mg/dL (ref 0.57–1.00)
Glucose: 187 mg/dL — ABNORMAL HIGH (ref 70–99)
Potassium: 4.3 mmol/L (ref 3.5–5.2)
Sodium: 141 mmol/L (ref 134–144)
eGFR: 107 mL/min/{1.73_m2} (ref >59)

## 2021-06-11 ENCOUNTER — Telehealth: Payer: Self-pay

## 2021-06-11 NOTE — Telephone Encounter (Signed)
Called pt back gave information on lab results pt verbalized understanding.

## 2021-06-11 NOTE — Telephone Encounter (Signed)
Patient returning nurse call about lab results. She was on her lunch break.   Please call her back on her cell phone [phone] she will try to answer, she does not have vm.  She asked if nurse could send the detail message through [place] about her blood work and if she needed to do anything.  She does not get off of work til 5:30 pm.

## 2021-06-14 ENCOUNTER — Encounter: Payer: Self-pay | Admitting: *Deleted

## 2021-06-14 ENCOUNTER — Other Ambulatory Visit: Payer: Self-pay | Admitting: Internal Medicine

## 2021-07-04 ENCOUNTER — Other Ambulatory Visit: Payer: Self-pay | Admitting: Internal Medicine

## 2021-07-04 DIAGNOSIS — E669 Obesity, unspecified: Secondary | ICD-10-CM

## 2021-07-05 MED ORDER — WEGOVY 0.25 MG/0.5ML ~~LOC~~ SOAJ: 0.2500 mg | SUBCUTANEOUS | 0 refills | Status: DC

## 2021-08-03 ENCOUNTER — Other Ambulatory Visit: Payer: Self-pay | Admitting: Internal Medicine

## 2021-08-03 DIAGNOSIS — E669 Obesity, unspecified: Secondary | ICD-10-CM

## 2021-08-04 MED ORDER — WEGOVY 0.25 MG/0.5ML ~~LOC~~ SOAJ: 0.2500 mg | SUBCUTANEOUS | 0 refills | Status: DC

## 2021-08-11 ENCOUNTER — Encounter: Payer: Self-pay | Admitting: Internal Medicine

## 2021-08-11 ENCOUNTER — Ambulatory Visit: Payer: Federal, State, Local not specified - PPO | Admitting: Internal Medicine

## 2021-08-11 ENCOUNTER — Telehealth: Payer: Self-pay | Admitting: Internal Medicine

## 2021-08-11 VITALS — BP 138/90 | HR 80 | Resp 18 | Ht 65.5 in | Wt 199.2 lb

## 2021-08-11 DIAGNOSIS — E119 Type 2 diabetes mellitus without complications: Secondary | ICD-10-CM

## 2021-08-11 DIAGNOSIS — F4321 Adjustment disorder with depressed mood: Secondary | ICD-10-CM | POA: Diagnosis not present

## 2021-08-11 DIAGNOSIS — E669 Obesity, unspecified: Secondary | ICD-10-CM

## 2021-08-11 MED ORDER — SEMAGLUTIDE-WEIGHT MANAGEMENT 0.5 MG/0.5ML ~~LOC~~ SOAJ: 0.5000 mg | SUBCUTANEOUS | 0 refills | Status: DC

## 2021-08-11 MED ORDER — SEMAGLUTIDE-WEIGHT MANAGEMENT 1 MG/0.5ML ~~LOC~~ SOAJ: 1.0000 mg | SUBCUTANEOUS | 0 refills | Status: DC

## 2021-08-11 NOTE — Assessment & Plan Note (Signed)
Lab Results  ?Component Value Date  ? HGBA1C 7.5 (H) 06/09/2021  ? ? ?On metformin 500 mg daily ?On Wegovy for weight loss ?Advised to follow diabetic diet ?On statin and ACEi ?F/u CMP and lipid panel ?Diabetic eye exam: Advised to follow up with Ophthalmology for diabetic eye exam ?

## 2021-08-11 NOTE — Telephone Encounter (Signed)
Pharmacy advised  

## 2021-08-11 NOTE — Telephone Encounter (Signed)
[place] in [place]  called in on patient behalf. ? ?Received 2 different prescriptions for patient ,  ? ?Wegovy .'5mg'$  and Wegovy .'1mg'$   ?  ?Wants clarification on which prescription should be filled for patient.  ? ?Call back [place] [phone] ?

## 2021-08-11 NOTE — Assessment & Plan Note (Signed)
Advised to follow low-carb diet and perform moderate exercise at least 150 minutes/week ?Initial BMI - 34.74, started [facility] for weight loss, plan to increase dose as tolerated - increased dose to 0.5 mg for now ?

## 2021-08-11 NOTE — Patient Instructions (Signed)
Please continue taking medications as prescribed. ? ?Please start taking Wegovy higher dose as prescribed. ? ?Please continue to follow low carb diet and perform moderate exercise/walking at least 150 mins/week. ?

## 2021-08-11 NOTE — Assessment & Plan Note (Signed)
Recently lost her daughter ?Offered grief counseling, offered [facility] therapy referral - she has good family support ?

## 2021-08-11 NOTE — Progress Notes (Signed)
? ?Established Patient Office Visit ? ?Subjective:  ?Patient ID: Teresa Russell, female    DOB: 1970/10/14  Age: 51 y.o. MRN: [id number] ? ?CC:  ?Chief Complaint  ?Patient presents with  ? Follow-up  ?  2 month follow up weight loss wegovy is working pt has been having trouble sleeping daughter passed away 2021-08-21   ? ? ?HPI ?Teresa Russell is a 51 y.o. female with past medical history of  HTN, DM, allergic rhinitis and obesity who presents for f/u of her chronic medical conditions. ? ?She c/o insomnia she lost her daughter 2 days ago.  She also reports anhedonia, but denies any SI or HI currently.  Her daughter had ESRD and was getting HD.  She states that she had taken her to the hospital on that day and her daughter had passed away in front of her.  She currently denies any visual or auditory hallucination. ? ?Type II DM and obesity: She has been tolerating Wegovy well.  She has lost about 13 pounds since the last visit.  She denies any major nausea or bloating currently.  She is willing to increase the dose of Wegovy now. She is also on Metformin currently. Denies any polyuria or polydipsia. ? ? ?Past Medical History:  ?Diagnosis Date  ? Arthritis   ? wrists and right hip, right knee  ? Diabetes mellitus without complication ([place])   ? GERD (gastroesophageal reflux disease)   ? Hyperlipidemia   ? Hypertension   ? Pneumonia   ? Sleep apnea   ? ? ?Past Surgical History:  ?Procedure Laterality Date  ? ABDOMINAL HYSTERECTOMY  2010  ? for heavy bleeding  ? COLONOSCOPY N/A 10/14/2014  ? Procedure: COLONOSCOPY;  Surgeon: Danie Binder, MD;  [facility];  Service: Endoscopy;  Laterality: N/A;  115 - moved to 11:15 - office notified pt  ? ? ?Family History  ?Problem Relation Age of Onset  ? Colon cancer Mother 61  ?     passed away age 34  ? Breast cancer Maternal Aunt   ?     currently treating  ? ? ?Social History  ? ?Socioeconomic History  ? Marital status: Single  ?  Spouse name: Not on file  ? Number of  children: Not on file  ? Years of education: Not on file  ? Highest education level: Not on file  ?Occupational History  ? Not on file  ?Tobacco Use  ? Smoking status: Former  ?  Types: Cigarettes  ?  Quit date: 05/02/2012  ?  Years since quitting: 9.2  ? Smokeless tobacco: Never  ?Vaping Use  ? Vaping Use: Never used  ?Substance and Sexual Activity  ? Alcohol use: Not Currently  ?  Comment: Last drink 2012; used to drink a case of beer every Friday  ? Drug use: No  ? Sexual activity: Yes  ?Other Topics Concern  ? Not on file  ?Social History Narrative  ? Not on file  ? ?Social Determinants of Health  ? ?Financial Resource Strain: Not on file  ?Food Insecurity: Not on file  ?Transportation Needs: Not on file  ?Physical Activity: Not on file  ?Stress: Not on file  ?Social Connections: Not on file  ?Intimate Partner Violence: Not on file  ? ? ?Outpatient Medications Prior to Visit  ?Medication Sig Dispense Refill  ? calcium-vitamin D (OSCAL WITH D) 250-125 MG-UNIT tablet Take 1 tablet by mouth daily.    ? fluticasone (FLONASE) 50 MCG/ACT  nasal spray Place 2 sprays into both nostrils daily. 16 g 5  ? ibuprofen (ADVIL,MOTRIN) 200 MG tablet Take 400 mg by mouth daily.     ? lisinopril-hydrochlorothiazide (ZESTORETIC) 10-12.5 MG tablet Take 1 tablet by mouth once daily 90 tablet 0  ? LYSINE ACETATE PO Take by mouth.    ? metFORMIN (GLUCOPHAGE-XR) 500 MG 24 hr tablet Take 1 tablet by mouth once daily with breakfast 90 tablet 0  ? Multiple Vitamins-Minerals (EMERGEN-C VITAMIN C PO) Take 1 packet by mouth daily.    ? nystatin (MYCOSTATIN/NYSTOP) powder Apply 1 application topically 3 (three) times daily. 15 g 0  ? Omega-3 1000 MG CAPS Take by mouth.    ? omeprazole (PRILOSEC) 20 MG capsule Take 1 capsule (20 mg total) by mouth daily as needed. 30 capsule 3  ? rosuvastatin (CRESTOR) 10 MG tablet Take 1 tablet (10 mg total) by mouth daily. 90 tablet 1  ? Simethicone (PHAZYME PO) Take 1 capsule by mouth daily.    ? triamcinolone  cream (KENALOG) 0.1 % Apply 1 application topically 2 (two) times daily. 30 g 0  ? Turmeric 400 MG CAPS Take by mouth.    ? zinc gluconate 50 MG tablet Take 50 mg by mouth daily.    ? amoxicillin-clavulanate (AUGMENTIN) 875-125 MG tablet Take 1 tablet by mouth 2 (two) times daily. (Patient not taking: Reported on 08/11/2021) 14 tablet 0  ? Semaglutide-Weight Management (WEGOVY) 0.25 MG/0.5ML SOAJ Inject 0.25 mg into the skin every 7 (seven) days. 2 mL 0  ? ?No facility-administered medications prior to visit.  ? ? ?Allergies  ?Allergen Reactions  ? Wellbutrin [Bupropion] Other (See Comments)  ?  Spinal pain  ? ? ?ROS ?Review of Systems  ?Constitutional:  Negative for chills and fever.  ?HENT:  Negative for congestion, sinus pressure, sinus pain and sore throat.   ?Eyes:  Negative for pain and discharge.  ?Respiratory:  Negative for cough and shortness of breath.   ?Cardiovascular:  Negative for chest pain and palpitations.  ?Gastrointestinal:  Positive for rectal pain. Negative for abdominal pain, diarrhea, nausea and vomiting.  ?Endocrine: Negative for polydipsia and polyuria.  ?Genitourinary:  Negative for dysuria and hematuria.  ?Musculoskeletal:  Negative for neck pain and neck stiffness.  ?Skin:  Positive for rash.  ?Neurological:  Negative for dizziness and weakness.  ?Psychiatric/Behavioral:  Negative for agitation and behavioral problems.   ? ?  ?Objective:  ?  ?Physical Exam ?Vitals reviewed.  ?Constitutional:   ?   General: She is not in acute distress. ?   Appearance: She is not diaphoretic.  ?HENT:  ?   Head: Normocephalic and atraumatic.  ?   Nose: Congestion present.  ?   Comments: DNS ?   Mouth/Throat:  ?   Mouth: Mucous membranes are dry.  ?Eyes:  ?   General: No scleral icterus. ?   Extraocular Movements: Extraocular movements intact.  ?Cardiovascular:  ?   Rate and Rhythm: Normal rate and regular rhythm.  ?   Pulses: Normal pulses.  ?   Heart sounds: Normal heart sounds. No murmur heard. ?Pulmonary:   ?   Breath sounds: Normal breath sounds. No wheezing or rales.  ?Abdominal:  ?   Palpations: Abdomen is soft.  ?   Tenderness: There is no abdominal tenderness.  ?   Hernia: A hernia (Umbilical) is present.  ?Musculoskeletal:  ?   Cervical back: Neck supple. No tenderness.  ?   Right lower leg: No edema.  ?  Left lower leg: No edema.  ?Skin: ?   General: Skin is warm.  ?   Findings: Rash (Whitish patch over extensor surface of elbow region) present.  ?Neurological:  ?   General: No focal deficit present.  ?   Mental Status: She is alert and oriented to person, place, and time.  ?   Sensory: No sensory deficit.  ?   Motor: No weakness.  ?Psychiatric:     ?   Mood and Affect: Mood normal.     ?   Behavior: Behavior normal.  ? ? ?BP 138/90 (BP Location: Left Arm, Patient Position: Sitting, Cuff Size: Normal)   Pulse 80   Resp 18   Ht 5' 5.5" (1.664 m)   Wt 199 lb 3.2 oz (90.4 kg)   SpO2 96%   BMI 32.64 kg/m?  ?Wt Readings from Last 3 Encounters:  ?08/11/21 199 lb 3.2 oz (90.4 kg)  ?06/09/21 212 lb (96.2 kg)  ?03/15/21 211 lb 0.6 oz (95.7 kg)  ? ? ?Lab Results  ?Component Value Date  ? TSH 1.190 12/30/2020  ? ?Lab Results  ?Component Value Date  ? WBC 5.8 12/30/2020  ? HGB 13.9 12/30/2020  ? HCT 40.4 12/30/2020  ? MCV 83 12/30/2020  ? PLT 283 12/30/2020  ? ?Lab Results  ?Component Value Date  ? NA 141 06/09/2021  ? K 4.3 06/09/2021  ? CO2 21 06/09/2021  ? GLUCOSE 187 (H) 06/09/2021  ? BUN 10 06/09/2021  ? CREATININE 0.67 06/09/2021  ? BILITOT 0.3 12/30/2020  ? ALKPHOS 83 12/30/2020  ? AST 23 12/30/2020  ? ALT 29 12/30/2020  ? PROT 6.9 12/30/2020  ? ALBUMIN 4.8 12/30/2020  ? CALCIUM 10.1 06/09/2021  ? ANIONGAP 11 10/27/2020  ? EGFR 107 06/09/2021  ? ?Lab Results  ?Component Value Date  ? CHOL 166 12/30/2020  ? ?Lab Results  ?Component Value Date  ? HDL 57 12/30/2020  ? ?Lab Results  ?Component Value Date  ? [place] 82 12/30/2020  ? ?Lab Results  ?Component Value Date  ? TRIG 156 (H) 12/30/2020  ? ?Lab Results   ?Component Value Date  ? CHOLHDL 2.9 12/30/2020  ? ?Lab Results  ?Component Value Date  ? HGBA1C 7.5 (H) 06/09/2021  ? ? ?  ?Assessment & Plan:  ? ?Problem List Items Addressed This Visit   ? ?  ? Endocrine

## 2021-08-15 LAB — MICROALBUMIN / CREATININE URINE RATIO
Creatinine, Urine: 224.8 mg/dL
Microalb/Creat Ratio: 27 mg/g creat (ref 0–29)
Microalbumin, Urine: 61.4 ug/mL

## 2021-08-18 ENCOUNTER — Ambulatory Visit (INDEPENDENT_AMBULATORY_CARE_PROVIDER_SITE_OTHER): Payer: Self-pay | Admitting: *Deleted

## 2021-08-18 VITALS — Ht 65.5 in | Wt 194.0 lb

## 2021-08-18 DIAGNOSIS — Z1211 Encounter for screening for malignant neoplasm of colon: Secondary | ICD-10-CM

## 2021-08-18 NOTE — Progress Notes (Addendum)
Gastroenterology Pre-Procedure Review ? ?Request Date: 08/18/2021 ?Requesting Physician: Dr. Ihor Dow @ [facility], Last TCS 10/14/2014 by Dr. Oneida Alar, hyperplastic polyps, small internal hemorrhoids, large external hemorrhoids, family hx of colon cancer (mother) ? ?PATIENT REVIEW QUESTIONS: The patient responded to the following health history questions as indicated:   ? ?1. Diabetes Melitis: yes, type II ?2. Joint replacements in the past 12 months: no ?3. Major health problems in the past 3 months: no ?4. Has an artificial valve or MVP: no ?5. Has a defibrillator: no ?6. Has been advised in past to take antibiotics in advance of a procedure like teeth cleaning: no ?7. Family history of colon cancer: yes, mother: age 29 or 69  ?2. Alcohol Use: no ?9. Illicit drug Use: no ?10. History of sleep apnea: yes, doesn't use CPAP  ?11. History of coronary artery or other vascular stents placed within the last 12 months: no ?12. History of any prior anesthesia complications: no ?13. Body mass index is 31.79 kg/m?. ?   ?MEDICATIONS & ALLERGIES:    ?Patient reports the following regarding taking any blood thinners:   ?Plavix? no ?Aspirin? no ?Coumadin? no ?Brilinta? no ?Xarelto? no ?Eliquis? no ?Pradaxa? no ?Savaysa? no ?Effient? no ? ?Patient confirms/reports the following medications:  ?Current Outpatient Medications  ?Medication Sig Dispense Refill  ? B Complex-C (VITAMIN B + C COMPLEX) TABS Take by mouth daily at 6 (six) AM. Plus Zinc    ? calcium-vitamin D (OSCAL WITH D) 250-125 MG-UNIT tablet Take 1 tablet by mouth daily.    ? fluticasone (FLONASE) 50 MCG/ACT nasal spray Place 2 sprays into both nostrils daily. 16 g 5  ? ibuprofen (ADVIL,MOTRIN) 200 MG tablet Take 400 mg by mouth daily.     ? lisinopril-hydrochlorothiazide (ZESTORETIC) 10-12.5 MG tablet Take 1 tablet by mouth once daily 90 tablet 0  ? LYSINE ACETATE PO Take by mouth daily at 6 (six) AM.    ? metFORMIN (GLUCOPHAGE-XR) 500 MG 24 hr tablet  Take 1 tablet by mouth once daily with breakfast 90 tablet 0  ? omeprazole (PRILOSEC) 20 MG capsule Take 1 capsule (20 mg total) by mouth daily as needed. 30 capsule 3  ? rosuvastatin (CRESTOR) 10 MG tablet Take 1 tablet (10 mg total) by mouth daily. 90 tablet 1  ? [START ON 09/02/2021] Semaglutide-Weight Management 0.5 MG/0.5ML SOAJ Inject 0.5 mg into the skin once a week for 28 days. 2 mL 0  ? [START ON 10/01/2021] Semaglutide-Weight Management 1 MG/0.5ML SOAJ Inject 1 mg into the skin once a week for 28 days. 2 mL 0  ? Simethicone (PHAZYME PO) Take 1 capsule by mouth as needed.    ? UNABLE TO FIND daily at 6 (six) AM. Med Name: Burn 60  Takes 2 tablets daily.    ? VITAMIN D PO Take by mouth daily at 6 (six) AM.    ? ?No current facility-administered medications for this visit.  ? ? ?Patient confirms/reports the following allergies:  ?Allergies  ?Allergen Reactions  ? Wellbutrin [Bupropion] Other (See Comments)  ?  Spinal pain  ? ? ?No orders of the defined types were placed in this encounter. ? ? ?AUTHORIZATION INFORMATION ?Primary Insurance: [place],  [place] #: [id number],  Group #: [id number] ?Pre-Cert / Josem Kaufmann required: No, not required ? ?SCHEDULE INFORMATION: ?Procedure has been scheduled as follows:  ?Date: 09/21/2021 , Time: 9:30 ?Location: [facility] with Dr. Abbey Chatters ? ?This Gastroenterology Pre-Precedure Review Form is being routed to the following provider(s): Magda Paganini  Bobby Rumpf, PA-C ?  ?

## 2021-08-25 ENCOUNTER — Other Ambulatory Visit: Payer: Self-pay | Admitting: Internal Medicine

## 2021-08-25 DIAGNOSIS — I10 Essential (primary) hypertension: Secondary | ICD-10-CM

## 2021-08-25 MED ORDER — LISINOPRIL-HYDROCHLOROTHIAZIDE 10-12.5 MG PO TABS: 1.0000 | ORAL_TABLET | Freq: Every day | ORAL | 0 refills | Status: DC

## 2021-08-26 ENCOUNTER — Other Ambulatory Visit: Payer: Self-pay | Admitting: Internal Medicine

## 2021-08-26 ENCOUNTER — Other Ambulatory Visit: Payer: Self-pay | Admitting: *Deleted

## 2021-08-26 ENCOUNTER — Telehealth: Payer: Self-pay

## 2021-08-26 DIAGNOSIS — E782 Mixed hyperlipidemia: Secondary | ICD-10-CM

## 2021-08-26 MED ORDER — ROSUVASTATIN CALCIUM 10 MG PO TABS: 10.0000 mg | ORAL_TABLET | Freq: Every day | ORAL | 1 refills | Status: DC

## 2021-08-26 NOTE — Telephone Encounter (Signed)
Medication refill sent to pharmacy  

## 2021-08-26 NOTE — Telephone Encounter (Signed)
Patient need med refil ? ?rosuvastatin (CRESTOR) 10 MG tablet  ? ?[facility] [place]  ?

## 2021-08-27 ENCOUNTER — Encounter: Payer: Self-pay | Admitting: *Deleted

## 2021-08-27 NOTE — Progress Notes (Signed)
Supplied pt with Clenpiq prep kit.  Pt will come pick it up on Wednesday along with all instructions needed for procedure.  Reviewed all instructions, medication adjustments, and lab information with pt by phone.   ?

## 2021-08-27 NOTE — Progress Notes (Addendum)
Ok to schedule. Asa 2. ?Hold semaglutide one week before tcs. ?Hold metformin day of prep and am of tcs.  ?She will need basic metabolic panel. She has had prior hysterectomy. ?

## 2021-08-30 ENCOUNTER — Other Ambulatory Visit: Payer: Self-pay | Admitting: *Deleted

## 2021-08-30 DIAGNOSIS — Z1211 Encounter for screening for malignant neoplasm of colon: Secondary | ICD-10-CM

## 2021-09-20 ENCOUNTER — Telehealth: Payer: Self-pay | Admitting: *Deleted

## 2021-09-20 ENCOUNTER — Other Ambulatory Visit (HOSPITAL_COMMUNITY)
Admission: RE | Admit: 2021-09-20 | Discharge: 2021-09-20 | Disposition: A | Discharge status: Home / Self Care | Payer: Federal, State, Local not specified - PPO | Source: Ambulatory Visit | Attending: Internal Medicine | Admitting: Internal Medicine

## 2021-09-20 DIAGNOSIS — Z1211 Encounter for screening for malignant neoplasm of colon: Secondary | ICD-10-CM | POA: Insufficient documentation

## 2021-09-20 DIAGNOSIS — K5140 Inflammatory polyps of colon without complications: Secondary | ICD-10-CM | POA: Diagnosis not present

## 2021-09-20 DIAGNOSIS — Z87891 Personal history of nicotine dependence: Secondary | ICD-10-CM | POA: Diagnosis not present

## 2021-09-20 DIAGNOSIS — I10 Essential (primary) hypertension: Secondary | ICD-10-CM | POA: Diagnosis not present

## 2021-09-20 DIAGNOSIS — Z7984 Long term (current) use of oral hypoglycemic drugs: Secondary | ICD-10-CM | POA: Diagnosis not present

## 2021-09-20 DIAGNOSIS — E119 Type 2 diabetes mellitus without complications: Secondary | ICD-10-CM | POA: Diagnosis not present

## 2021-09-20 DIAGNOSIS — K219 Gastro-esophageal reflux disease without esophagitis: Secondary | ICD-10-CM | POA: Diagnosis not present

## 2021-09-20 DIAGNOSIS — K5730 Diverticulosis of large intestine without perforation or abscess without bleeding: Secondary | ICD-10-CM | POA: Diagnosis not present

## 2021-09-20 DIAGNOSIS — Z800 Family history of malignant neoplasm of digestive organs: Secondary | ICD-10-CM | POA: Diagnosis not present

## 2021-09-20 DIAGNOSIS — K648 Other hemorrhoids: Secondary | ICD-10-CM | POA: Diagnosis not present

## 2021-09-20 LAB — BASIC METABOLIC PANEL
Anion gap: 5 (ref 5–15)
BUN: 14 mg/dL (ref 6–20)
CO2: 27 mmol/L (ref 22–32)
Calcium: 9.6 mg/dL (ref 8.9–10.3)
Chloride: 108 mmol/L (ref 98–111)
Creatinine, Ser: 0.6 mg/dL (ref 0.44–1.00)
GFR, Estimated: >60 mL/min (ref >60)
Glucose, Bld: 114 mg/dL — ABNORMAL HIGH (ref 70–99)
Potassium: 3.7 mmol/L (ref 3.5–5.1)
Sodium: 140 mmol/L (ref 135–145)

## 2021-09-20 NOTE — Telephone Encounter (Signed)
Threasa Beards called me to inform me that pt did not show for labs.  Called pt and told her that she needs to have them drawn today before noon.  She voiced understanding.  She said that she just opened up her instructions this morning and was going to ask about that.  She wanted to know if having hair pins in was still a problem.  She just recently got an up do done.  Spoke to [place] about it and was informed that it is best not to have them in so she did not receive a burn from the metal touching her skin if she has to have any polyps removed.  Relayed info to pt and she said that the hair pins are up further in her hair and aren't touching her skin.  She still would like to proceed with procedure understanding the risks.  Pt reminded to have labs drawn today before noon.

## 2021-09-21 ENCOUNTER — Ambulatory Visit (HOSPITAL_COMMUNITY): Payer: Federal, State, Local not specified - PPO | Admitting: Anesthesiology

## 2021-09-21 ENCOUNTER — Ambulatory Visit (HOSPITAL_COMMUNITY)
Admission: RE | Admit: 2021-09-21 | Discharge: 2021-09-21 | Disposition: A | Discharge status: Home / Self Care | Payer: Federal, State, Local not specified - PPO | Source: Ambulatory Visit | Attending: Internal Medicine | Admitting: Internal Medicine

## 2021-09-21 ENCOUNTER — Encounter (HOSPITAL_COMMUNITY): Payer: Self-pay

## 2021-09-21 ENCOUNTER — Other Ambulatory Visit: Payer: Self-pay

## 2021-09-21 ENCOUNTER — Encounter (HOSPITAL_COMMUNITY)
Admission: RE | Disposition: A | Discharge status: Home / Self Care | Payer: Self-pay | Source: Ambulatory Visit | Attending: Internal Medicine

## 2021-09-21 DIAGNOSIS — Z7984 Long term (current) use of oral hypoglycemic drugs: Secondary | ICD-10-CM | POA: Insufficient documentation

## 2021-09-21 DIAGNOSIS — K5730 Diverticulosis of large intestine without perforation or abscess without bleeding: Secondary | ICD-10-CM | POA: Diagnosis not present

## 2021-09-21 DIAGNOSIS — Z1211 Encounter for screening for malignant neoplasm of colon: Secondary | ICD-10-CM | POA: Diagnosis not present

## 2021-09-21 DIAGNOSIS — K648 Other hemorrhoids: Secondary | ICD-10-CM | POA: Diagnosis not present

## 2021-09-21 DIAGNOSIS — K635 Polyp of colon: Secondary | ICD-10-CM | POA: Diagnosis not present

## 2021-09-21 DIAGNOSIS — K5140 Inflammatory polyps of colon without complications: Secondary | ICD-10-CM | POA: Diagnosis not present

## 2021-09-21 DIAGNOSIS — I10 Essential (primary) hypertension: Secondary | ICD-10-CM | POA: Insufficient documentation

## 2021-09-21 DIAGNOSIS — Z139 Encounter for screening, unspecified: Secondary | ICD-10-CM | POA: Diagnosis not present

## 2021-09-21 DIAGNOSIS — J309 Allergic rhinitis, unspecified: Secondary | ICD-10-CM

## 2021-09-21 DIAGNOSIS — K611 Rectal abscess: Secondary | ICD-10-CM

## 2021-09-21 DIAGNOSIS — L309 Dermatitis, unspecified: Secondary | ICD-10-CM

## 2021-09-21 DIAGNOSIS — K6389 Other specified diseases of intestine: Secondary | ICD-10-CM | POA: Diagnosis not present

## 2021-09-21 DIAGNOSIS — F4321 Adjustment disorder with depressed mood: Secondary | ICD-10-CM

## 2021-09-21 DIAGNOSIS — L720 Epidermal cyst: Secondary | ICD-10-CM

## 2021-09-21 DIAGNOSIS — E119 Type 2 diabetes mellitus without complications: Secondary | ICD-10-CM | POA: Insufficient documentation

## 2021-09-21 DIAGNOSIS — K219 Gastro-esophageal reflux disease without esophagitis: Secondary | ICD-10-CM | POA: Diagnosis not present

## 2021-09-21 DIAGNOSIS — K529 Noninfective gastroenteritis and colitis, unspecified: Secondary | ICD-10-CM | POA: Diagnosis not present

## 2021-09-21 DIAGNOSIS — Z87891 Personal history of nicotine dependence: Secondary | ICD-10-CM | POA: Diagnosis not present

## 2021-09-21 DIAGNOSIS — D125 Benign neoplasm of sigmoid colon: Secondary | ICD-10-CM

## 2021-09-21 DIAGNOSIS — Z800 Family history of malignant neoplasm of digestive organs: Secondary | ICD-10-CM | POA: Insufficient documentation

## 2021-09-21 DIAGNOSIS — J342 Deviated nasal septum: Secondary | ICD-10-CM

## 2021-09-21 DIAGNOSIS — E669 Obesity, unspecified: Secondary | ICD-10-CM

## 2021-09-21 DIAGNOSIS — K649 Unspecified hemorrhoids: Secondary | ICD-10-CM

## 2021-09-21 DIAGNOSIS — Z0001 Encounter for general adult medical examination with abnormal findings: Secondary | ICD-10-CM

## 2021-09-21 DIAGNOSIS — G4730 Sleep apnea, unspecified: Secondary | ICD-10-CM | POA: Diagnosis not present

## 2021-09-21 DIAGNOSIS — K429 Umbilical hernia without obstruction or gangrene: Secondary | ICD-10-CM

## 2021-09-21 DIAGNOSIS — G4710 Hypersomnia, unspecified: Secondary | ICD-10-CM

## 2021-09-21 HISTORY — PX: COLONOSCOPY WITH PROPOFOL: SHX5780

## 2021-09-21 HISTORY — PX: BIOPSY: SHX5522

## 2021-09-21 HISTORY — PX: POLYPECTOMY: SHX5525

## 2021-09-21 LAB — GLUCOSE, CAPILLARY: Glucose-Capillary: 119 mg/dL — ABNORMAL HIGH (ref 70–99)

## 2021-09-21 SURGERY — COLONOSCOPY WITH PROPOFOL
Anesthesia: General

## 2021-09-21 MED ORDER — PROPOFOL 10 MG/ML IV BOLUS
INTRAVENOUS | Status: DC | PRN
  Administered 2021-09-21: 30 mg via INTRAVENOUS
  Administered 2021-09-21: 150 mg via INTRAVENOUS
  Administered 2021-09-21: 50 mg via INTRAVENOUS
  Administered 2021-09-21: 20 mg via INTRAVENOUS

## 2021-09-21 MED ORDER — LIDOCAINE HCL (CARDIAC) PF 100 MG/5ML IV SOSY
PREFILLED_SYRINGE | INTRAVENOUS | Status: DC | PRN
Start: 2021-09-21 — End: 2021-09-21
  Administered 2021-09-21: 50 mg via INTRAVENOUS

## 2021-09-21 MED ORDER — LACTATED RINGERS IV SOLN: INTRAVENOUS | Status: DC

## 2021-09-21 NOTE — H&P (Signed)
Primary Care Physician:  Eloise Harman, DO Primary Gastroenterologist:  Dr. Abbey Chatters  Pre-Procedure History & Physical: HPI:  Teresa Russell is a 51 y.o. female is here for a colonoscopy to be performed for  high risk colon cancer screening purposes/family hx of colon cancer (mother)  Past Medical History:  Diagnosis Date   Arthritis    wrists and right hip, right knee   Diabetes mellitus without complication ([place])    GERD (gastroesophageal reflux disease)    Hyperlipidemia    Hypertension    Pneumonia    Sleep apnea     Past Surgical History:  Procedure Laterality Date   ABDOMINAL HYSTERECTOMY  2010   for heavy bleeding   COLONOSCOPY N/A 10/14/2014   Procedure: COLONOSCOPY;  Surgeon: Danie Binder, MD;  Location: [facility];  Service: Endoscopy;  Laterality: N/A;  115 - moved to 11:15 - office notified pt    Prior to Admission medications   Medication Sig Start Date End Date Taking? Authorizing Provider  B Complex-C (VITAMIN B + C COMPLEX) TABS Take by mouth daily at 6 (six) AM. Plus Zinc   Yes [provider]  calcium-vitamin D (OSCAL WITH D) 250-125 MG-UNIT tablet Take 1 tablet by mouth daily.   Yes [provider]  fluticasone (FLONASE) 50 MCG/ACT nasal spray Place 2 sprays into both nostrils daily. 06/09/21  Yes Lindell Spar, MD  ibuprofen (ADVIL,MOTRIN) 200 MG tablet Take 400 mg by mouth daily.    Yes [provider]  lisinopril-hydrochlorothiazide (ZESTORETIC) 10-12.5 MG tablet Take 1 tablet by mouth daily. 08/25/21  Yes Lindell Spar, MD  LYSINE ACETATE PO Take by mouth daily at 6 (six) AM.   Yes [provider]  metFORMIN (GLUCOPHAGE-XR) 500 MG 24 hr tablet Take 1 tablet by mouth once daily with breakfast 06/14/21  Yes Lindell Spar, MD  omeprazole (PRILOSEC) 20 MG capsule Take 1 capsule (20 mg total) by mouth daily as needed. 12/30/20  Yes Lindell Spar, MD  Semaglutide-Weight Management 1 MG/0.5ML SOAJ Inject 1 mg into the  skin once a week for 28 days. 10/01/21 10/29/21 Yes Lindell Spar, MD  Simethicone (PHAZYME PO) Take 1 capsule by mouth as needed.   Yes [provider]  VITAMIN D PO Take by mouth daily at 6 (six) AM.   Yes [provider]  rosuvastatin (CRESTOR) 10 MG tablet Take 1 tablet (10 mg total) by mouth daily. 08/26/21   Lindell Spar, MD  Semaglutide-Weight Management 0.5 MG/0.5ML SOAJ Inject 0.5 mg into the skin once a week for 28 days. 09/02/21 09/30/21  Lindell Spar, MD  UNABLE TO FIND daily at 6 (six) AM. Med Name: Burn 24  Takes 2 tablets daily.    [provider]    Allergies as of 08/30/2021 - Review Complete 08/18/2021  Allergen Reaction Noted   Wellbutrin [bupropion] Other (See Comments) 10/10/2014    Family History  Problem Relation Age of Onset   Colon cancer Mother 64       passed away age 50   Breast cancer Maternal Aunt        currently treating    Social History   Socioeconomic History   Marital status: Single    Spouse name: Not on file   Number of children: Not on file   Years of education: Not on file   Highest education level: Not on file  Occupational History   Not on file  Tobacco Use  Smoking status: Former    Types: Cigarettes    Quit date: 05/02/2012    Years since quitting: 9.3   Smokeless tobacco: Never  Vaping Use   Vaping Use: Never used  Substance and Sexual Activity   Alcohol use: Not Currently    Comment: Last drink 2012; used to drink a case of beer every Friday   Drug use: No   Sexual activity: Yes  Other Topics Concern   Not on file  Social History Narrative   Not on file   Social Determinants of Health   Financial Resource Strain: Not on file  Food Insecurity: Not on file  Transportation Needs: Not on file  Physical Activity: Not on file  Stress: Not on file  Social Connections: Not on file  Intimate Partner Violence: Not on file    Review of Systems: See HPI, otherwise negative ROS  Physical  Exam: Vital signs in last 24 hours: Temp:  [97.7 F (36.5 C)] 97.7 F (36.5 C) (05/23 0806) Pulse Rate:  [71] 71 (05/23 0806) Resp:  [14] 14 (05/23 0806) BP: (127)/(93) 127/93 (05/23 0806) SpO2:  [95 %] 95 % (05/23 0806)   General:   Alert,  Well-developed, well-nourished, pleasant and cooperative in NAD Head:  Normocephalic and atraumatic. Eyes:  Sclera clear, no icterus.   Conjunctiva pink. Ears:  Normal auditory acuity. Nose:  No deformity, discharge,  or lesions. Mouth:  No deformity or lesions, dentition normal. Neck:  Supple; no masses or thyromegaly. Lungs:  Clear throughout to auscultation.   No wheezes, crackles, or rhonchi. No acute distress. Heart:  Regular rate and rhythm; no murmurs, clicks, rubs,  or gallops. Abdomen:  Soft, nontender and nondistended. No masses, hepatosplenomegaly or hernias noted. Normal bowel sounds, without guarding, and without rebound.   Msk:  Symmetrical without gross deformities. Normal posture. Extremities:  Without clubbing or edema. Neurologic:  Alert and  oriented x4;  grossly normal neurologically. Skin:  Intact without significant lesions or rashes. Cervical Nodes:  No significant cervical adenopathy. Psych:  Alert and cooperative. Normal mood and affect.  Impression/Plan: Teresa Russell is here for a colonoscopy to be performed for  high risk colon cancer screening purposes/family hx of colon cancer (mother)  The risks of the procedure including infection, bleed, or perforation as well as benefits, limitations, alternatives and imponderables have been reviewed with the patient. Questions have been answered. All parties agreeable.

## 2021-09-21 NOTE — Anesthesia Preprocedure Evaluation (Addendum)
Anesthesia Evaluation  Patient identified by MRN, date of birth, ID band Patient awake    Reviewed: Allergy & Precautions, NPO status , Patient's Chart, lab work & pertinent test results  Airway Mallampati: II  TM Distance: >3 FB Neck ROM: Full    Dental  (+) Dental Advisory Given, Missing, Partial Upper   Pulmonary sleep apnea , pneumonia, former smoker,    Pulmonary exam normal breath sounds clear to auscultation       Cardiovascular hypertension, Pt. on medications Normal cardiovascular exam Rhythm:Regular Rate:Normal     Neuro/Psych negative neurological ROS  negative psych ROS   GI/Hepatic Neg liver ROS, GERD  Medicated,  Endo/Other  diabetes, Well Controlled, Type 2, Oral Hypoglycemic Agents  Renal/GU negative Renal ROS  negative genitourinary   Musculoskeletal  (+) Arthritis , Osteoarthritis,    Abdominal   Peds negative pediatric ROS (+)  Hematology negative hematology ROS (+)   Anesthesia Other Findings   Reproductive/Obstetrics negative OB ROS                            Anesthesia Physical Anesthesia Plan  ASA: 2  Anesthesia Plan: General   Post-op Pain Management: Minimal or no pain anticipated   Induction:   PONV Risk Score and Plan: Propofol infusion  Airway Management Planned: Nasal Cannula and Natural Airway  Additional Equipment:   Intra-op Plan:   Post-operative Plan:   Informed Consent: I have reviewed the patients History and Physical, chart, labs and discussed the procedure including the risks, benefits and alternatives for the proposed anesthesia with the patient or authorized representative who has indicated his/her understanding and acceptance.     Dental advisory given  Plan Discussed with: CRNA and Surgeon  Anesthesia Plan Comments:         Anesthesia Quick Evaluation

## 2021-09-21 NOTE — Op Note (Signed)
[facility] Patient Name: Teresa Russell Procedure Date: 09/21/2021 8:56 AM MRN: [id number] Date of Birth: 1970-05-24 Attending MD: Elon Alas. Abbey Chatters DO CSN: [id number] Age: 51 Admit Type: Outpatient Procedure:                Colonoscopy Indications:              Colon cancer screening in patient at increased                            risk: Colorectal cancer in mother Providers:                Elon Alas. Abbey Chatters, DO, Lambert Mody, Caprice Kluver Referring MD:              Medicines:                See the Anesthesia note for documentation of the                            administered medications Complications:            No immediate complications. Estimated Blood Loss:     Estimated blood loss was minimal. Procedure:                Pre-Anesthesia Assessment:                           - The anesthesia plan was to use monitored                            anesthesia care (MAC).                           After obtaining informed consent, the colonoscope                            was passed under direct vision. Throughout the                            procedure, the patient's blood pressure, pulse, and                            oxygen saturations were monitored continuously. The                            [id number]-HQ190L ([id number]) scope was introduced through                            the anus and advanced to the the cecum, identified                            by appendiceal orifice and ileocecal valve. The                            colonoscopy was performed without difficulty. The                            patient  tolerated the procedure well. The quality                            of the bowel preparation was evaluated using the                            BBPS [facility] Bowel Preparation Scale) with scores                            of: Right Colon = 2 (minor amount of residual                            staining, small fragments of stool and/or opaque                             liquid, but mucosa seen well), Transverse Colon = 3                            (entire mucosa seen well with no residual staining,                            small fragments of stool or opaque liquid) and Left                            Colon = 3 (entire mucosa seen well with no residual                            staining, small fragments of stool or opaque                            liquid). The total BBPS score equals 8. The quality                            of the bowel preparation was good. Scope In: 9:10:39 AM Scope Out: 9:26:58 AM Scope Withdrawal Time: 0 hours 13 minutes 6 seconds  Total Procedure Duration: 0 hours 16 minutes 19 seconds  Findings:      The perianal and digital rectal examinations were normal.      Non-bleeding internal hemorrhoids were found during endoscopy.      Multiple medium-mouthed diverticula were found in the sigmoid colon,       descending colon and transverse colon.      A 4 mm polyp was found in the sigmoid colon. The polyp was pedunculated.       The polyp was removed with a cold snare. Resection and retrieval were       complete.      Localized mild inflammation characterized by erythema was found in the       sigmoid colon. Biopsies were taken with a cold forceps for histology. Impression:               - Non-bleeding internal hemorrhoids.                           - Diverticulosis in the sigmoid colon, in  the                            descending colon and in the transverse colon.                           - One 4 mm polyp in the sigmoid colon, removed with                            a cold snare. Resected and retrieved.                           - Localized mild inflammation was found in the                            sigmoid colon. Biopsied. Moderate Sedation:      Per Anesthesia Care Recommendation:           - Patient has a contact number available for                            emergencies. The signs and symptoms of potential                             delayed complications were discussed with the                            patient. Return to normal activities tomorrow.                            Written discharge instructions were provided to the                            patient.                           - Resume previous diet.                           - Continue present medications.                           - Await pathology results.                           - Repeat colonoscopy in 5 years for surveillance                            and family history of colon cancer in mother. Procedure Code(s):        --- Professional ---                           [id number], Colonoscopy, flexible; with removal of                            tumor(s), polyp(s), or other lesion(s) by snare  technique                           45380, 59, Colonoscopy, flexible; with biopsy,                            single or multiple Diagnosis Code(s):        --- Professional ---                           Z80.0, Family history of malignant neoplasm of                            digestive organs                           K64.8, Other hemorrhoids                           K63.5, Polyp of colon                           K52.9, Noninfective gastroenteritis and colitis,                            unspecified                           K57.30, Diverticulosis of large intestine without                            perforation or abscess without bleeding CPT copyright 2019 American Medical Association. All rights reserved. The codes documented in this report are preliminary and upon coder review may  be revised to meet current compliance requirements. Elon Alas. Abbey Chatters, DO [place] Abbey Chatters, DO 09/21/2021 9:32:02 AM This report has been signed electronically. Number of Addenda: 0

## 2021-09-21 NOTE — Anesthesia Procedure Notes (Signed)
Date/Time: 09/21/2021 9:11 AM Performed by: Orlie Dakin, CRNA Pre-anesthesia Checklist: Patient identified, Emergency Drugs available, Suction available and Patient being monitored Patient Re-evaluated:Patient Re-evaluated prior to induction Oxygen Delivery Method: Nasal cannula Induction Type: IV induction Placement Confirmation: positive ETCO2

## 2021-09-21 NOTE — Transfer of Care (Signed)
Immediate Anesthesia Transfer of Care Note  Patient: Teresa Russell  Procedure(s) Performed: COLONOSCOPY WITH PROPOFOL POLYPECTOMY BIOPSY  Patient Location: Endoscopy Unit  Anesthesia Type:General  Level of Consciousness: awake and alert   Airway & Oxygen Therapy: Patient Spontanous Breathing  Post-op Assessment: Report given to RN and Post -op Vital signs reviewed and stable  Post vital signs: Reviewed and stable  Last Vitals:  Vitals Value Taken Time  BP    Temp    Pulse 72   Resp 10   SpO2 98%     Last Pain:  Vitals:   09/21/21 0906  TempSrc:   PainSc: 0-No pain      Patients Stated Pain Goal: 8 (59/93/57 0177)  Complications: No notable events documented.

## 2021-09-21 NOTE — Discharge Instructions (Addendum)
  Colonoscopy Discharge Instructions  Read the instructions outlined below and refer to this sheet in the next few weeks. These discharge instructions provide you with general information on caring for yourself after you leave the hospital. Your doctor may also give you specific instructions. While your treatment has been planned according to the most current medical practices available, unavoidable complications occasionally occur.   ACTIVITY You may resume your regular activity, but move at a slower pace for the next 24 hours.  Take frequent rest periods for the next 24 hours.  Walking will help get rid of the air and reduce the bloated feeling in your belly (abdomen).  No driving for 24 hours (because of the medicine (anesthesia) used during the test).   Do not sign any important legal documents or operate any machinery for 24 hours (because of the anesthesia used during the test).  NUTRITION Drink plenty of fluids.  You may resume your normal diet as instructed by your doctor.  Begin with a light meal and progress to your normal diet. Heavy or fried foods are harder to digest and may make you feel sick to your stomach (nauseated).  Avoid alcoholic beverages for 24 hours or as instructed.  MEDICATIONS You may resume your normal medications unless your doctor tells you otherwise.  WHAT YOU CAN EXPECT TODAY Some feelings of bloating in the abdomen.  Passage of more gas than usual.  Spotting of blood in your stool or on the toilet paper.  IF YOU HAD POLYPS REMOVED DURING THE COLONOSCOPY: No aspirin products for 7 days or as instructed.  No alcohol for 7 days or as instructed.  Eat a soft diet for the next 24 hours.  FINDING OUT THE RESULTS OF YOUR TEST Not all test results are available during your visit. If your test results are not back during the visit, make an appointment with your caregiver to find out the results. Do not assume everything is normal if you have not heard from your  caregiver or the medical facility. It is important for you to follow up on all of your test results.  SEEK IMMEDIATE MEDICAL ATTENTION IF: You have more than a spotting of blood in your stool.  Your belly is swollen (abdominal distention).  You are nauseated or vomiting.  You have a temperature over 101.  You have abdominal pain or discomfort that is severe or gets worse throughout the day.   Your colonoscopy revealed 1 polyp(s) which I removed successfully. Await pathology results, my office will contact you. I recommend repeating colonoscopy in 5 years for surveillance purposes.  Slight amount of inflammation in the left side of your colon.  Likely due to colon prep itself.  I did take biopsies of this region as well.  We will call you with these results.  Otherwise follow-up with GI as needed.   I hope you have a great rest of your week!  Elon Alas. Abbey Chatters, D.O. Gastroenterology and Hepatology [facility]

## 2021-09-21 NOTE — Anesthesia Postprocedure Evaluation (Signed)
Anesthesia Post Note  Patient: Teresa Russell  Procedure(s) Performed: COLONOSCOPY WITH PROPOFOL POLYPECTOMY BIOPSY  Patient location during evaluation: Phase II Anesthesia Type: General Level of consciousness: awake and alert and oriented Pain management: pain level controlled Vital Signs Assessment: post-procedure vital signs reviewed and stable Respiratory status: spontaneous breathing, nonlabored ventilation and respiratory function stable Cardiovascular status: blood pressure returned to baseline and stable Postop Assessment: no apparent nausea or vomiting Anesthetic complications: no   No notable events documented.   Last Vitals:  Vitals:   09/21/21 0806 09/21/21 0931  BP: (!) 127/93 101/71  Pulse: 71   Resp: 14 12  Temp: 36.5 C 36.5 C  SpO2: 95% 98%    Last Pain:  Vitals:   09/21/21 0931  TempSrc: Oral  PainSc: 0-No pain                 Emersen Carroll C Gara Kincade

## 2021-09-22 LAB — SURGICAL PATHOLOGY

## 2021-09-23 ENCOUNTER — Other Ambulatory Visit: Payer: Federal, State, Local not specified - PPO | Admitting: Internal Medicine

## 2021-09-28 ENCOUNTER — Encounter: Payer: Self-pay | Admitting: Internal Medicine

## 2021-09-28 ENCOUNTER — Ambulatory Visit (INDEPENDENT_AMBULATORY_CARE_PROVIDER_SITE_OTHER): Payer: Federal, State, Local not specified - PPO | Admitting: Internal Medicine

## 2021-09-28 VITALS — BP 118/86 | HR 83 | Resp 18 | Ht 65.5 in | Wt 195.0 lb

## 2021-09-28 DIAGNOSIS — R1032 Left lower quadrant pain: Secondary | ICD-10-CM | POA: Diagnosis not present

## 2021-09-28 DIAGNOSIS — E669 Obesity, unspecified: Secondary | ICD-10-CM | POA: Diagnosis not present

## 2021-09-28 NOTE — Assessment & Plan Note (Signed)
Advised to follow low-carb diet and perform moderate exercise at least 150 minutes/week Initial BMI - 34.74, started [facility] for weight loss, plan to increase dose as tolerated - increased dose to 1 mg for now

## 2021-09-28 NOTE — Patient Instructions (Addendum)
Please continue to take medications as prescribed.  Please continue to maintain adequate hydration by taking at least 64 ounces of fluid in a day.  Please follow BRAT diet (Bananas, rice, applesauce and toast) for 2 days when you have left lower abdominal pain. If it is persistent, please contact us or your GI physician.

## 2021-09-28 NOTE — Progress Notes (Signed)
Acute Office Visit  Subjective:    Patient ID: Teresa Russell, female    DOB: 1970-09-28, 51 y.o.   MRN: [id number]  Chief Complaint  Patient presents with   Abdominal Pain    Pt having abdominal pain left lower side started around 09-07-21 doesn't hurt as bad had colonoscopy was told inflammation on left lower side     HPI Patient is in today for complaint of left lower quadrant pain for the last 3 weeks, which is worse after her colonoscopy in the last week.  She denies any fever, chills, nausea or vomiting currently.  She has had intermittent episodes of abdominal pain in the past as well.  She denies any melena, but has episodes of rectal bleeding due to hemorrhoids.  Her colonoscopy showed mild inflammation in the sigmoid area and diverticulosis.  She has tried taking simethicone for gassy feeling.  Past Medical History:  Diagnosis Date   Arthritis    wrists and right hip, right knee   Diabetes mellitus without complication (HCC)    GERD (gastroesophageal reflux disease)    Hyperlipidemia    Hypertension    Pneumonia    Sleep apnea     Past Surgical History:  Procedure Laterality Date   ABDOMINAL HYSTERECTOMY  2010   for heavy bleeding   COLONOSCOPY N/A 10/14/2014   Procedure: COLONOSCOPY;  Surgeon: Danie Binder, MD;  Location: [facility];  Service: Endoscopy;  Laterality: N/A;  115 - moved to 11:15 - office notified pt    Family History  Problem Relation Age of Onset   Colon cancer Mother 5       passed away age 90   Breast cancer Maternal Aunt        currently treating    Social History   Socioeconomic History   Marital status: Single    Spouse name: Not on file   Number of children: Not on file   Years of education: Not on file   Highest education level: Not on file  Occupational History   Not on file  Tobacco Use   Smoking status: Former    Types: Cigarettes    Quit date: 05/02/2012    Years since quitting: 9.4   Smokeless tobacco: Never  Vaping  Use   Vaping Use: Never used  Substance and Sexual Activity   Alcohol use: Not Currently    Comment: Last drink 2012; used to drink a case of beer every Friday   Drug use: No   Sexual activity: Yes  Other Topics Concern   Not on file  Social History Narrative   Not on file   Social Determinants of Health   Financial Resource Strain: Not on file  Food Insecurity: Not on file  Transportation Needs: Not on file  Physical Activity: Not on file  Stress: Not on file  Social Connections: Not on file  Intimate Partner Violence: Not on file    Outpatient Medications Prior to Visit  Medication Sig Dispense Refill   B Complex-C (VITAMIN B + C COMPLEX) TABS Take by mouth daily at 6 (six) AM. Plus Zinc     calcium-vitamin D (OSCAL WITH D) 250-125 MG-UNIT tablet Take 1 tablet by mouth daily.     fluticasone (FLONASE) 50 MCG/ACT nasal spray Place 2 sprays into both nostrils daily. 16 g 5   ibuprofen (ADVIL,MOTRIN) 200 MG tablet Take 400 mg by mouth daily.      lisinopril-hydrochlorothiazide (ZESTORETIC) 10-12.5 MG tablet Take 1 tablet  by mouth daily. 90 tablet 0   LYSINE ACETATE PO Take by mouth daily at 6 (six) AM.     metFORMIN (GLUCOPHAGE-XR) 500 MG 24 hr tablet Take 1 tablet by mouth once daily with breakfast 90 tablet 0   omeprazole (PRILOSEC) 20 MG capsule Take 1 capsule (20 mg total) by mouth daily as needed. 30 capsule 3   rosuvastatin (CRESTOR) 10 MG tablet Take 1 tablet (10 mg total) by mouth daily. 90 tablet 1   Semaglutide-Weight Management 0.5 MG/0.5ML SOAJ Inject 0.5 mg into the skin once a week for 28 days. 2 mL 0   [START ON 10/01/2021] Semaglutide-Weight Management 1 MG/0.5ML SOAJ Inject 1 mg into the skin once a week for 28 days. 2 mL 0   Simethicone (PHAZYME PO) Take 1 capsule by mouth as needed.     UNABLE TO FIND daily at 6 (six) AM. Med Name: Burn 60  Takes 2 tablets daily.     VITAMIN D PO Take by mouth daily at 6 (six) AM.     No facility-administered medications prior  to visit.    Allergies  Allergen Reactions   Wellbutrin [Bupropion] Other (See Comments)    Spinal pain    Review of Systems  Constitutional:  Negative for chills and fever.  HENT:  Negative for congestion, sinus pressure, sinus pain and sore throat.   Eyes:  Negative for pain and discharge.  Respiratory:  Negative for cough and shortness of breath.   Cardiovascular:  Negative for chest pain and palpitations.  Gastrointestinal:  Positive for abdominal pain. Negative for diarrhea, nausea and vomiting.  Endocrine: Negative for polydipsia and polyuria.  Genitourinary:  Negative for dysuria and hematuria.  Musculoskeletal:  Negative for neck pain and neck stiffness.  Skin:  Positive for rash.  Neurological:  Negative for dizziness and weakness.  Psychiatric/Behavioral:  Negative for agitation and behavioral problems.       Objective:    Physical Exam Vitals reviewed.  Constitutional:      General: She is not in acute distress.    Appearance: She is not diaphoretic.  HENT:     Head: Normocephalic and atraumatic.     Nose: No congestion.     Comments: DNS    Mouth/Throat:     Mouth: Mucous membranes are dry.  Eyes:     General: No scleral icterus.    Extraocular Movements: Extraocular movements intact.  Cardiovascular:     Rate and Rhythm: Normal rate and regular rhythm.     Pulses: Normal pulses.     Heart sounds: Normal heart sounds. No murmur heard. Pulmonary:     Breath sounds: Normal breath sounds. No wheezing or rales.  Abdominal:     Palpations: Abdomen is soft.     Tenderness: There is no abdominal tenderness.     Hernia: A hernia (Umbilical) is present.  Musculoskeletal:     Cervical back: Neck supple. No tenderness.     Right lower leg: No edema.     Left lower leg: No edema.  Skin:    General: Skin is warm.     Findings: Rash (Whitish patch over extensor surface of elbow region) present.  Neurological:     General: No focal deficit present.     Mental  Status: She is alert and oriented to person, place, and time.     Sensory: No sensory deficit.     Motor: No weakness.  Psychiatric:        Mood and Affect:  Mood is depressed.        Speech: Speech is delayed.        Behavior: Behavior is slowed.    BP 118/86 (BP Location: Left Arm, Patient Position: Sitting, Cuff Size: Normal)   Pulse 83   Resp 18   Ht 5' 5.5" (1.664 m)   Wt 195 lb (88.5 kg)   SpO2 96%   BMI 31.96 kg/m  Wt Readings from Last 3 Encounters:  09/28/21 195 lb (88.5 kg)  08/18/21 194 lb (88 kg)  08/11/21 199 lb 3.2 oz (90.4 kg)        Assessment & Plan:   Problem List Items Addressed This Visit    Visit Diagnoses     LLQ abdominal pain    -  Primary Recent colonoscopy report reviewed - showed mild inflammation in sigmoid colon and diverticulosis Her abdominal pain has resolved now -low concern for diverticulitis If persistent, advised to contact GI Simethicone as needed for bloating MiraLAX as needed for constipation        No orders of the defined types were placed in this encounter.    Lindell Spar, MD

## 2021-10-04 ENCOUNTER — Other Ambulatory Visit: Payer: Self-pay | Admitting: Internal Medicine

## 2021-10-04 DIAGNOSIS — E669 Obesity, unspecified: Secondary | ICD-10-CM

## 2021-10-05 MED ORDER — SEMAGLUTIDE-WEIGHT MANAGEMENT 1 MG/0.5ML ~~LOC~~ SOAJ: 1.0000 mg | SUBCUTANEOUS | 0 refills | Status: DC

## 2021-10-06 ENCOUNTER — Encounter: Payer: Self-pay | Admitting: Internal Medicine

## 2021-10-06 ENCOUNTER — Other Ambulatory Visit: Payer: Self-pay | Admitting: Internal Medicine

## 2021-10-06 ENCOUNTER — Other Ambulatory Visit: Payer: Self-pay | Admitting: *Deleted

## 2021-10-06 DIAGNOSIS — E669 Obesity, unspecified: Secondary | ICD-10-CM

## 2021-10-06 DIAGNOSIS — E119 Type 2 diabetes mellitus without complications: Secondary | ICD-10-CM

## 2021-10-06 MED ORDER — SEMAGLUTIDE-WEIGHT MANAGEMENT 2.4 MG/0.75ML ~~LOC~~ SOAJ
2.4000 mg | SUBCUTANEOUS | 3 refills | Status: DC
Start: 2021-11-01 — End: 2021-12-28

## 2021-10-06 MED ORDER — SEMAGLUTIDE-WEIGHT MANAGEMENT 1.7 MG/0.75ML ~~LOC~~ SOAJ: 1.7000 mg | SUBCUTANEOUS | 0 refills | Status: AC

## 2021-10-24 ENCOUNTER — Other Ambulatory Visit: Payer: Self-pay | Admitting: Internal Medicine

## 2021-10-24 DIAGNOSIS — E669 Obesity, unspecified: Secondary | ICD-10-CM

## 2021-11-10 ENCOUNTER — Ambulatory Visit: Payer: Federal, State, Local not specified - PPO | Admitting: Internal Medicine

## 2021-11-10 ENCOUNTER — Encounter: Payer: Self-pay | Admitting: Internal Medicine

## 2021-11-10 VITALS — BP 132/82 | HR 78 | Resp 18 | Ht 65.0 in | Wt 189.0 lb

## 2021-11-10 DIAGNOSIS — E1169 Type 2 diabetes mellitus with other specified complication: Secondary | ICD-10-CM

## 2021-11-10 DIAGNOSIS — L83 Acanthosis nigricans: Secondary | ICD-10-CM | POA: Diagnosis not present

## 2021-11-10 DIAGNOSIS — E782 Mixed hyperlipidemia: Secondary | ICD-10-CM | POA: Insufficient documentation

## 2021-11-10 DIAGNOSIS — I10 Essential (primary) hypertension: Secondary | ICD-10-CM

## 2021-11-10 DIAGNOSIS — Z90710 Acquired absence of both cervix and uterus: Secondary | ICD-10-CM | POA: Insufficient documentation

## 2021-11-10 DIAGNOSIS — E669 Obesity, unspecified: Secondary | ICD-10-CM

## 2021-11-10 MED ORDER — LISINOPRIL-HYDROCHLOROTHIAZIDE 10-12.5 MG PO TABS: 1.0000 | ORAL_TABLET | Freq: Every day | ORAL | 3 refills | Status: DC

## 2021-11-10 NOTE — Assessment & Plan Note (Signed)
On Crestor Check lipid profile 

## 2021-11-10 NOTE — Assessment & Plan Note (Signed)
BP Readings from Last 1 Encounters:  11/10/21 132/82   Well-controlled with Lisinopril-HCTZ Counseled for compliance with the medications Advised DASH diet and moderate exercise/walking, at least 150 mins/week

## 2021-11-10 NOTE — Assessment & Plan Note (Addendum)
BMI Readings from Last 3 Encounters:  11/10/21 31.45 kg/m  09/28/21 31.96 kg/m  08/18/21 31.79 kg/m    Advised to follow low-carb diet and perform moderate exercise at least 150 minutes/week Initial BMI - 34.74, on Wegovy - tolerating well

## 2021-11-10 NOTE — Assessment & Plan Note (Signed)
Dark spots in axillary and neck area likely acanthosis nigricans, in the setting of DM Referred to dermatology as per patient request

## 2021-11-10 NOTE — Patient Instructions (Signed)
Please continue taking medications as prescribed.  Please continue to follow low carb diet and perform moderate exercise/walking at least 150 mins/week. 

## 2021-11-10 NOTE — Assessment & Plan Note (Signed)
Lab Results  Component Value Date   HGBA1C 7.5 (H) 06/09/2021   Well controlled On metformin 500 mg daily On Wegovy for weight loss Advised to follow diabetic diet On statin and ACEi F/u CMP and lipid panel Diabetic eye exam: Advised to follow up with Ophthalmology for diabetic eye exam

## 2021-11-10 NOTE — Progress Notes (Addendum)
Established Patient Office Visit  Subjective:  Patient ID: FLORNCE RECORD, female    DOB: 12/27/1970  Age: 51 y.o. MRN: [id number]  CC:  Chief Complaint  Patient presents with   Follow-up    3 month follow up weight management and DM pt would like to discuss FMLA forms, also would like referral to dermatology for dark spots under arm and neck    HPI Teresa Russell is a 51 y.o. female with past medical history of  HTN, DM, allergic rhinitis and obesity who presents for f/u of her chronic medical conditions.  Type II DM and obesity: She has been tolerating Wegovy well.  She has lost about 23 pounds since starting Wegovy.  She denies any major nausea or bloating currently. She is also on Metformin currently. Denies any polyuria or polydipsia.  She takes Crestor for HLD.  HTN: BP is well-controlled. Takes medications regularly. Patient denies headache, dizziness, chest pain, dyspnea or palpitations.  She complains of dark spots underneath armpit and in the neck area, which are chronic.  Denies any itching.  She requests dermatology referral for it.  Past Medical History:  Diagnosis Date   Arthritis    wrists and right hip, right knee   Diabetes mellitus without complication (HCC)    GERD (gastroesophageal reflux disease)    Hyperlipidemia    Hypertension    Pneumonia    Sleep apnea     Past Surgical History:  Procedure Laterality Date   ABDOMINAL HYSTERECTOMY  2010   for heavy bleeding   BIOPSY  09/21/2021   Procedure: BIOPSY;  Surgeon: Eloise Harman, DO;  Location: [facility];  Service: Endoscopy;;   COLONOSCOPY N/A 10/14/2014   Procedure: COLONOSCOPY;  Surgeon: Danie Binder, MD;  [facility]: [facility];  Service: Endoscopy;  Laterality: N/A;  115 - moved to 11:15 - office notified pt   COLONOSCOPY WITH PROPOFOL N/A 09/21/2021   Procedure: COLONOSCOPY WITH PROPOFOL;  Surgeon: Eloise Harman, DO;  [facility]: [facility];  Service: Endoscopy;  Laterality: N/A;   9:30 / ASA 2   POLYPECTOMY  09/21/2021   Procedure: POLYPECTOMY;  Surgeon: Eloise Harman, DO;  [facility]: [facility];  Service: Endoscopy;;    Family History  Problem Relation Age of Onset   Colon cancer Mother 74       passed away age 84   Breast cancer Maternal Aunt        currently treating    Social History   Socioeconomic History   Marital status: Single    Spouse name: Not on file   Number of children: Not on file   Years of education: Not on file   Highest education level: Not on file  Occupational History   Not on file  Tobacco Use   Smoking status: Former    Types: Cigarettes    Quit date: 05/02/2012    Years since quitting: 9.5   Smokeless tobacco: Never  Vaping Use   Vaping Use: Never used  Substance and Sexual Activity   Alcohol use: Not Currently    Comment: Last drink 2012; used to drink a case of beer every Friday   Drug use: No   Sexual activity: Yes  Other Topics Concern   Not on file  Social History Narrative   Not on file   Social Determinants of Health   Financial Resource Strain: Not on file  Food Insecurity: Not on file  Transportation Needs: Not on file  Physical Activity: Not on file  Stress: Not on file  Social Connections: Not on file  Intimate Partner Violence: Not on file    Outpatient Medications Prior to Visit  Medication Sig Dispense Refill   B Complex-C (VITAMIN B + C COMPLEX) TABS Take by mouth daily at 6 (six) AM. Plus Zinc     calcium-vitamin D (OSCAL WITH D) 250-125 MG-UNIT tablet Take 1 tablet by mouth daily.     fluticasone (FLONASE) 50 MCG/ACT nasal spray Place 2 sprays into both nostrils daily. 16 g 5   ibuprofen (ADVIL,MOTRIN) 200 MG tablet Take 400 mg by mouth daily.      LYSINE ACETATE PO Take by mouth daily at 6 (six) AM.     metFORMIN (GLUCOPHAGE-XR) 500 MG 24 hr tablet Take 1 tablet by mouth once daily with breakfast 90 tablet 0   omeprazole (PRILOSEC) 20 MG capsule Take 1 capsule (20 mg total) by mouth daily  as needed. 30 capsule 3   rosuvastatin (CRESTOR) 10 MG tablet Take 1 tablet (10 mg total) by mouth daily. 90 tablet 1   Semaglutide-Weight Management 2.4 MG/0.75ML SOAJ Inject 2.4 mg into the skin once a week. 3 mL 3   Simethicone (PHAZYME PO) Take 1 capsule by mouth as needed.     UNABLE TO FIND daily at 6 (six) AM. Med Name: Burn 60  Takes 2 tablets daily.     VITAMIN D PO Take by mouth daily at 6 (six) AM.     lisinopril-hydrochlorothiazide (ZESTORETIC) 10-12.5 MG tablet Take 1 tablet by mouth daily. 90 tablet 0   No facility-administered medications prior to visit.    Allergies  Allergen Reactions   Wellbutrin [Bupropion] Other (See Comments)    Spinal pain    ROS Review of Systems  Constitutional:  Negative for chills and fever.  HENT:  Negative for congestion, sinus pressure, sinus pain and sore throat.   Eyes:  Negative for pain and discharge.  Respiratory:  Negative for cough and shortness of breath.   Cardiovascular:  Negative for chest pain and palpitations.  Gastrointestinal:  Negative for diarrhea, nausea and vomiting.  Endocrine: Negative for polydipsia and polyuria.  Genitourinary:  Negative for dysuria and hematuria.  Musculoskeletal:  Negative for neck pain and neck stiffness.  Skin:  Positive for rash.  Neurological:  Negative for dizziness and weakness.  Psychiatric/Behavioral:  Negative for agitation and behavioral problems.       Objective:    Physical Exam Vitals reviewed.  Constitutional:      General: She is not in acute distress.    Appearance: She is not diaphoretic.  HENT:     Head: Normocephalic and atraumatic.     Nose: No congestion.     Comments: DNS    Mouth/Throat:     Mouth: Mucous membranes are dry.  Eyes:     General: No scleral icterus.    Extraocular Movements: Extraocular movements intact.  Cardiovascular:     Rate and Rhythm: Normal rate and regular rhythm.     Pulses: Normal pulses.     Heart sounds: Normal heart sounds. No  murmur heard. Pulmonary:     Breath sounds: Normal breath sounds. No wheezing or rales.  Abdominal:     Hernia: A hernia (Umbilical) is present.  Musculoskeletal:     Cervical back: Neck supple. No tenderness.     Right lower leg: No edema.     Left lower leg: No edema.  Skin:    General: Skin  is warm.     Findings: Rash (Whitish patch over extensor surface of elbow region) present.     Comments: Hyperpigmentation in axillary and neck area -likely acanthosis nigricans  Neurological:     General: No focal deficit present.     Mental Status: She is alert and oriented to person, place, and time.     Sensory: No sensory deficit.     Motor: No weakness.  Psychiatric:        Mood and Affect: Mood normal.        Behavior: Behavior normal.     BP 132/82 (BP Location: Right Arm, Patient Position: Sitting, Cuff Size: Normal)   Pulse 78   Resp 18   Ht 5' 5"  (1.651 m)   Wt 189 lb (85.7 kg)   SpO2 96%   BMI 31.45 kg/m  Wt Readings from Last 3 Encounters:  11/10/21 189 lb (85.7 kg)  09/28/21 195 lb (88.5 kg)  08/18/21 194 lb (88 kg)    Lab Results  Component Value Date   TSH 1.190 12/30/2020   Lab Results  Component Value Date   WBC 5.8 12/30/2020   HGB 13.9 12/30/2020   HCT 40.4 12/30/2020   MCV 83 12/30/2020   PLT 283 12/30/2020   Lab Results  Component Value Date   NA 140 09/20/2021   K 3.7 09/20/2021   CO2 27 09/20/2021   GLUCOSE 114 (H) 09/20/2021   BUN 14 09/20/2021   CREATININE 0.60 09/20/2021   BILITOT 0.3 12/30/2020   ALKPHOS 83 12/30/2020   AST 23 12/30/2020   ALT 29 12/30/2020   PROT 6.9 12/30/2020   ALBUMIN 4.8 12/30/2020   CALCIUM 9.6 09/20/2021   ANIONGAP 5 09/20/2021   EGFR 107 06/09/2021   Lab Results  Component Value Date   CHOL 166 12/30/2020   Lab Results  Component Value Date   HDL 57 12/30/2020   Lab Results  Component Value Date   LDLCALC 82 12/30/2020   Lab Results  Component Value Date   TRIG 156 (H) 12/30/2020   Lab  Results  Component Value Date   CHOLHDL 2.9 12/30/2020   Lab Results  Component Value Date   HGBA1C 7.5 (H) 06/09/2021      Assessment & Plan:   Problem List Items Addressed This Visit       Cardiovascular and Mediastinum   Hypertension    BP Readings from Last 1 Encounters:  11/10/21 132/82  Well-controlled with Lisinopril-HCTZ Counseled for compliance with the medications Advised DASH diet and moderate exercise/walking, at least 150 mins/week      Relevant Medications   lisinopril-hydrochlorothiazide (ZESTORETIC) 10-12.5 MG tablet     Endocrine   DM (diabetes mellitus) ([place]) - Primary    Lab Results  Component Value Date   HGBA1C 7.5 (H) 06/09/2021  Well controlled On metformin 500 mg daily On Wegovy for weight loss Advised to follow diabetic diet On statin and ACEi F/u CMP and lipid panel Diabetic eye exam: Advised to follow up with Ophthalmology for diabetic eye exam      Relevant Medications   lisinopril-hydrochlorothiazide (ZESTORETIC) 10-12.5 MG tablet   Other Relevant Orders   CMP14+EGFR   Hemoglobin A1c     Musculoskeletal and Integument   Acanthosis nigricans    Dark spots in axillary and neck area likely acanthosis nigricans, in the setting of DM Referred to dermatology as per patient request      Relevant Orders   Ambulatory referral to Dermatology  Other   Obesity (BMI 30-39.9)    BMI Readings from Last 3 Encounters:  11/10/21 31.45 kg/m  09/28/21 31.96 kg/m  08/18/21 31.79 kg/m   Advised to follow low-carb diet and perform moderate exercise at least 150 minutes/week Initial BMI - 34.74, on Wegovy - tolerating well      Mixed hyperlipidemia    On Crestor Check lipid profile      Relevant Medications   lisinopril-hydrochlorothiazide (ZESTORETIC) 10-12.5 MG tablet   Other Relevant Orders   Lipid Profile    Meds ordered this encounter  Medications   lisinopril-hydrochlorothiazide (ZESTORETIC) 10-12.5 MG tablet    Sig:  Take 1 tablet by mouth daily.    Dispense:  90 tablet    Refill:  3    Follow-up: Return in about 4 months (around 03/13/2022) for Annual physical.    Lindell Spar, MD

## 2021-11-11 LAB — CMP14+EGFR
ALT: 29 IU/L (ref 0–32)
AST: 24 IU/L (ref 0–40)
Albumin/Globulin Ratio: 2 (ref 1.2–2.2)
Albumin: 4.7 g/dL (ref 3.9–4.9)
Alkaline Phosphatase: 70 IU/L (ref 44–121)
BUN/Creatinine Ratio: 16 (ref 9–23)
BUN: 11 mg/dL (ref 6–24)
Bilirubin Total: 0.5 mg/dL (ref 0.0–1.2)
CO2: 24 mmol/L (ref 20–29)
Calcium: 10.2 mg/dL (ref 8.7–10.2)
Chloride: 102 mmol/L (ref 96–106)
Creatinine, Ser: 0.67 mg/dL (ref 0.57–1.00)
Globulin, Total: 2.3 g/dL (ref 1.5–4.5)
Glucose: 99 mg/dL (ref 70–99)
Potassium: 4 mmol/L (ref 3.5–5.2)
Sodium: 142 mmol/L (ref 134–144)
Total Protein: 7 g/dL (ref 6.0–8.5)
eGFR: 107 mL/min/{1.73_m2} (ref >59)

## 2021-11-11 LAB — LIPID PANEL
Chol/HDL Ratio: 2.7 ratio (ref 0.0–4.4)
Cholesterol, Total: 139 mg/dL (ref 100–199)
HDL: 51 mg/dL (ref >39)
LDL Chol Calc (NIH): 68 mg/dL (ref 0–99)
Triglycerides: 109 mg/dL (ref 0–149)
VLDL Cholesterol Cal: 20 mg/dL (ref 5–40)

## 2021-11-11 LAB — HEMOGLOBIN A1C
Est. average glucose Bld gHb Est-mCnc: 120 mg/dL
Hgb A1c MFr Bld: 5.8 % — ABNORMAL HIGH (ref 4.8–5.6)

## 2021-12-07 ENCOUNTER — Telehealth: Payer: Self-pay

## 2021-12-07 DIAGNOSIS — Z0279 Encounter for issue of other medical certificate: Secondary | ICD-10-CM

## 2021-12-07 NOTE — Telephone Encounter (Signed)
FMLA - intermittent   Copied Noted Sleeved  Call patient when forms are ready.

## 2021-12-09 NOTE — Telephone Encounter (Signed)
Patient called, will pick up forms.

## 2021-12-10 NOTE — Telephone Encounter (Signed)
Patient picked up forms.

## 2021-12-13 ENCOUNTER — Other Ambulatory Visit: Payer: Self-pay | Admitting: Internal Medicine

## 2021-12-15 LAB — HM DIABETES EYE EXAM

## 2021-12-20 ENCOUNTER — Encounter: Payer: Self-pay | Admitting: Internal Medicine

## 2021-12-21 NOTE — Telephone Encounter (Signed)
Note sent thru mychart

## 2021-12-27 ENCOUNTER — Encounter: Payer: Self-pay | Admitting: Internal Medicine

## 2021-12-28 ENCOUNTER — Other Ambulatory Visit: Payer: Self-pay | Admitting: *Deleted

## 2021-12-28 DIAGNOSIS — E669 Obesity, unspecified: Secondary | ICD-10-CM

## 2021-12-28 MED ORDER — SEMAGLUTIDE-WEIGHT MANAGEMENT 2.4 MG/0.75ML ~~LOC~~ SOAJ: 2.4000 mg | SUBCUTANEOUS | 3 refills | Status: DC

## 2021-12-29 ENCOUNTER — Telehealth: Payer: Self-pay | Admitting: Internal Medicine

## 2021-12-29 NOTE — Telephone Encounter (Signed)
Patient called requesting a call back.  Teresa Russell was denied and patient wants to know why

## 2021-12-29 NOTE — Telephone Encounter (Signed)
Sent pt mychart message

## 2022-01-26 ENCOUNTER — Encounter: Payer: Self-pay | Admitting: Internal Medicine

## 2022-01-26 ENCOUNTER — Other Ambulatory Visit (HOSPITAL_COMMUNITY)
Admission: RE | Admit: 2022-01-26 | Discharge: 2022-01-26 | Disposition: A | Discharge status: Home / Self Care | Payer: Federal, State, Local not specified - PPO | Source: Ambulatory Visit | Attending: Internal Medicine | Admitting: Internal Medicine

## 2022-01-26 ENCOUNTER — Ambulatory Visit: Payer: Federal, State, Local not specified - PPO | Admitting: Internal Medicine

## 2022-01-26 VITALS — BP 126/82 | HR 93 | Resp 18 | Ht 65.0 in | Wt 186.6 lb

## 2022-01-26 DIAGNOSIS — Z01419 Encounter for gynecological examination (general) (routine) without abnormal findings: Secondary | ICD-10-CM | POA: Insufficient documentation

## 2022-01-26 DIAGNOSIS — N760 Acute vaginitis: Secondary | ICD-10-CM | POA: Insufficient documentation

## 2022-01-26 DIAGNOSIS — Z2821 Immunization not carried out because of patient refusal: Secondary | ICD-10-CM | POA: Diagnosis not present

## 2022-01-26 MED ORDER — FLUCONAZOLE 150 MG PO TABS: 150.0000 mg | ORAL_TABLET | Freq: Once | ORAL | 0 refills | Status: AC

## 2022-01-26 NOTE — Progress Notes (Signed)
Acute Office Visit  Subjective:    Patient ID: Teresa Russell, female    DOB: 23-Sep-1970, 51 y.o.   MRN: [id number]  Chief Complaint  Patient presents with   Vaginal Discharge    Patient started having vaginal discharge 01-25-22 all day heavy but is going away today had a little abdomen pain     HPI Patient is in today for c/o vaginal discharge yesterday, which was clear initially, but thick and whitish later.  She denies any vaginal bleeding.  Denies any flank pain.  Denies any fever or chills.  Denies any dysuria or hematuria currently.  He is not sexually active currently.  She has history of controlled type II DM.  Past Medical History:  Diagnosis Date   Arthritis    wrists and right hip, right knee   Diabetes mellitus without complication (HCC)    GERD (gastroesophageal reflux disease)    Hyperlipidemia    Hypertension    Pneumonia    Sleep apnea     Past Surgical History:  Procedure Laterality Date   ABDOMINAL HYSTERECTOMY  2010   for heavy bleeding   BIOPSY  09/21/2021   Procedure: BIOPSY;  Surgeon: Eloise Harman, DO;  [facility];  Service: Endoscopy;;   COLONOSCOPY N/A 10/14/2014   Procedure: COLONOSCOPY;  Surgeon: Danie Binder, MD;  [facility];  Service: Endoscopy;  Laterality: N/A;  115 - moved to 11:15 - office notified pt   COLONOSCOPY WITH PROPOFOL N/A 09/21/2021   Procedure: COLONOSCOPY WITH PROPOFOL;  Surgeon: Eloise Harman, DO;  [facility];  Service: Endoscopy;  Laterality: N/A;  9:30 / ASA 2   POLYPECTOMY  09/21/2021   Procedure: POLYPECTOMY;  Surgeon: Eloise Harman, DO;  [facility];  Service: Endoscopy;;    Family History  Problem Relation Age of Onset   Colon cancer Mother 51       passed away age 17   Breast cancer Maternal Aunt        currently treating    Social History   Socioeconomic History   Marital status: Single    Spouse name: Not on file   Number of children: Not on file    Years of education: Not on file   Highest education level: Not on file  Occupational History   Not on file  Tobacco Use   Smoking status: Former    Types: Cigarettes    Quit date: 05/02/2012    Years since quitting: 9.7   Smokeless tobacco: Never  Vaping Use   Vaping Use: Never used  Substance and Sexual Activity   Alcohol use: Not Currently    Comment: Last drink 2012; used to drink a case of beer every Friday   Drug use: No   Sexual activity: Yes  Other Topics Concern   Not on file  Social History Narrative   Not on file   Social Determinants of Health   Financial Resource Strain: Not on file  Food Insecurity: Not on file  Transportation Needs: Not on file  Physical Activity: Not on file  Stress: Not on file  Social Connections: Not on file  Intimate Partner Violence: Not on file    Outpatient Medications Prior to Visit  Medication Sig Dispense Refill   B Complex-C (VITAMIN B + C COMPLEX) TABS Take by mouth daily at 6 (six) AM. Plus Zinc     calcium-vitamin D (OSCAL WITH D) 250-125 MG-UNIT tablet Take 1  tablet by mouth daily.     fluticasone (FLONASE) 50 MCG/ACT nasal spray Place 2 sprays into both nostrils daily. 16 g 5   ibuprofen (ADVIL,MOTRIN) 200 MG tablet Take 400 mg by mouth daily.      lisinopril-hydrochlorothiazide (ZESTORETIC) 10-12.5 MG tablet Take 1 tablet by mouth daily. 90 tablet 3   LYSINE ACETATE PO Take by mouth daily at 6 (six) AM.     metFORMIN (GLUCOPHAGE-XR) 500 MG 24 hr tablet Take 1 tablet by mouth once daily with breakfast 90 tablet 0   omeprazole (PRILOSEC) 20 MG capsule Take 1 capsule (20 mg total) by mouth daily as needed. 30 capsule 3   rosuvastatin (CRESTOR) 10 MG tablet Take 1 tablet (10 mg total) by mouth daily. 90 tablet 1   Semaglutide-Weight Management 2.4 MG/0.75ML SOAJ Inject 2.4 mg into the skin once a week. 3 mL 3   Simethicone (PHAZYME PO) Take 1 capsule by mouth as needed.     UNABLE TO FIND daily at 6 (six) AM. Med Name: Burn  60  Takes 2 tablets daily.     VITAMIN D PO Take by mouth daily at 6 (six) AM.     No facility-administered medications prior to visit.    Allergies  Allergen Reactions   Wellbutrin [Bupropion] Other (See Comments)    Spinal pain    Review of Systems  Constitutional:  Negative for chills and fever.  Eyes:  Negative for pain and discharge.  Respiratory:  Negative for cough and shortness of breath.   Cardiovascular:  Negative for chest pain and palpitations.  Gastrointestinal:  Negative for diarrhea, nausea and vomiting.  Genitourinary:  Positive for vaginal discharge. Negative for dysuria, flank pain, hematuria and vaginal pain.  Musculoskeletal:  Negative for neck pain and neck stiffness.  Skin:  Negative for rash.  Neurological:  Negative for dizziness and weakness.  Psychiatric/Behavioral:  Negative for agitation and behavioral problems.        Objective:    Physical Exam Vitals reviewed.  Constitutional:      General: She is not in acute distress.    Appearance: She is not diaphoretic.  HENT:     Head: Normocephalic and atraumatic.     Nose: No congestion.     Comments: DNS    Mouth/Throat:     Mouth: Mucous membranes are dry.  Eyes:     General: No scleral icterus.    Extraocular Movements: Extraocular movements intact.  Cardiovascular:     Rate and Rhythm: Normal rate and regular rhythm.     Pulses: Normal pulses.     Heart sounds: Normal heart sounds. No murmur heard. Pulmonary:     Breath sounds: Normal breath sounds. No wheezing or rales.  Abdominal:     Hernia: A hernia (Umbilical) is present.  Musculoskeletal:     Cervical back: Neck supple. No tenderness.     Right lower leg: No edema.     Left lower leg: No edema.  Skin:    General: Skin is warm.     Findings: Rash (Whitish patch over extensor surface of elbow region) present.     Comments: Hyperpigmentation in axillary and neck area -likely acanthosis nigricans  Neurological:     General: No  focal deficit present.     Mental Status: She is alert and oriented to person, place, and time.     Sensory: No sensory deficit.     Motor: No weakness.  Psychiatric:        Mood  and Affect: Mood normal.        Behavior: Behavior normal.     BP 126/82 (BP Location: Left Arm, Patient Position: Sitting, Cuff Size: Normal)   Pulse 93   Resp 18   Ht '5\' 5"'$  (1.651 m)   Wt 186 lb 9.6 oz (84.6 kg)   SpO2 93%   BMI 31.05 kg/m  Wt Readings from Last 3 Encounters:  01/26/22 186 lb 9.6 oz (84.6 kg)  11/10/21 189 lb (85.7 kg)  09/28/21 195 lb (88.5 kg)        Assessment & Plan:   Problem List Items Addressed This Visit       Other   Refused influenza vaccine   Other Visit Diagnoses     Acute vaginitis    -  Primary Vaginal discharge could be due to candidal vaginitis considering her history of type II DM Prescribed empiric fluconazole Check vaginal swab for BV and Candida Advised to keep vaginal area clean and dry   Relevant Medications   fluconazole (DIFLUCAN) 150 MG tablet        Meds ordered this encounter  Medications   fluconazole (DIFLUCAN) 150 MG tablet    Sig: Take 1 tablet (150 mg total) by mouth once for 1 dose.    Dispense:  1 tablet    Refill:  0     Carnell Casamento Keith Rake, MD

## 2022-01-27 ENCOUNTER — Other Ambulatory Visit: Payer: Self-pay | Admitting: *Deleted

## 2022-01-27 DIAGNOSIS — L309 Dermatitis, unspecified: Secondary | ICD-10-CM

## 2022-01-27 LAB — CERVICOVAGINAL ANCILLARY ONLY
Bacterial Vaginitis (gardnerella): NEGATIVE
Candida Glabrata: NEGATIVE
Candida Vaginitis: NEGATIVE
Comment: NEGATIVE
Comment: NEGATIVE
Comment: NEGATIVE

## 2022-01-27 NOTE — Progress Notes (Signed)
Pt advised with verbal understanding  °

## 2022-02-17 ENCOUNTER — Telehealth: Payer: Federal, State, Local not specified - PPO | Admitting: Emergency Medicine

## 2022-02-17 DIAGNOSIS — B9789 Other viral agents as the cause of diseases classified elsewhere: Secondary | ICD-10-CM | POA: Diagnosis not present

## 2022-02-17 DIAGNOSIS — J019 Acute sinusitis, unspecified: Secondary | ICD-10-CM | POA: Diagnosis not present

## 2022-02-17 MED ORDER — IPRATROPIUM BROMIDE 0.03 % NA SOLN: 2.0000 | Freq: Two times a day (BID) | NASAL | 0 refills | Status: DC

## 2022-02-17 NOTE — Progress Notes (Signed)
E-Visit for Sinus Problems  We are sorry that you are not feeling well.  Here is how we plan to help!  Based on what you have shared with me it looks like you have sinusitis.  Sinusitis is inflammation and infection in the sinus cavities of the head.  Based on your presentation I believe you most likely have Acute Viral Sinusitis.This is an infection most likely caused by a virus. There is not specific treatment for viral sinusitis other than to help you with the symptoms until the infection runs its course.    Antibiotics are not recommended by the Infectious [employer] Society of [place] unless you have severe symptoms (including high fever) or you have symptoms for more than 10 days. If you still have symptoms after 10 days, antibiotics should be considered.    You may use plain Mucinex for your congestion. Make sure to drink plenty of fluids.   Saline nasal spray help and can safely be used as often as needed for congestion. Try using saline irrigation, such as with a neti pot, several times a day while you are sick. Many neti pots come with salt packets premeasured to use to make saline. If you use your own salt, make sure it is kosher salt or sea salt (don't use table salt as it has iodine in it and you don't need that in your nose). Use distilled water to make saline. If you mix your own saline using your own salt, the recipe is 1/4 teaspoon salt in 1 cup warm water. Using saline irrigation can help prevent and treat sinus infections.  I have prescribed: Ipratropium Bromide nasal spray 0.03% 2 sprays in eah nostril 2-3 times a day  Some authorities believe that zinc sprays or the use of Echinacea may shorten the course of your symptoms.  Sinus infections are not as easily transmitted as other respiratory infection, however we still recommend that you avoid close contact with loved ones, especially the very young and elderly.  Remember to wash your hands thoroughly throughout the day as this is  the number one way to prevent the spread of infection!  Home Care: Only take medications as instructed by your medical team. Do not take these medications with alcohol. A steam or ultrasonic humidifier can help congestion.  You can place a towel over your head and breathe in the steam from hot water coming from a faucet. Avoid close contacts especially the very young and the elderly. Cover your mouth when you cough or sneeze. Always remember to wash your hands.  Get Help Right Away If: You develop worsening fever or sinus pain. You develop a severe head ache or visual changes. Your symptoms persist after you have completed your treatment plan.  Make sure you Understand these instructions. Will watch your condition. Will get help right away if you are not doing well or get worse.   Thank you for choosing an e-visit.  Your e-visit answers were reviewed by a board certified advanced clinical practitioner to complete your personal care plan. Depending upon the condition, your plan could have included both over the counter or prescription medications.  Please review your pharmacy choice. Make sure the pharmacy is open so you can pick up prescription now. If there is a problem, you may contact your provider through [employer] and have the prescription routed to another pharmacy.  Your safety is important to [place]. If you have drug allergies check your prescription carefully.   For the next 24 hours  you can use MyChart to ask questions about today's visit, request a non-urgent call back, or ask for a work or school excuse. You will get an email in the next two days asking about your experience. I hope that your e-visit has been valuable and will speed your recovery.  I have spent 5 minutes in review of e-visit questionnaire, review and updating patient chart, medical decision making and response to patient.   Willeen Cass, PhD, FNP-BC

## 2022-02-18 ENCOUNTER — Other Ambulatory Visit: Payer: Self-pay | Admitting: Internal Medicine

## 2022-02-18 DIAGNOSIS — E782 Mixed hyperlipidemia: Secondary | ICD-10-CM

## 2022-03-16 DIAGNOSIS — L83 Acanthosis nigricans: Secondary | ICD-10-CM | POA: Diagnosis not present

## 2022-03-16 DIAGNOSIS — L820 Inflamed seborrheic keratosis: Secondary | ICD-10-CM | POA: Diagnosis not present

## 2022-03-17 ENCOUNTER — Other Ambulatory Visit: Payer: Self-pay | Admitting: Internal Medicine

## 2022-03-30 ENCOUNTER — Encounter: Payer: Self-pay | Admitting: Internal Medicine

## 2022-03-30 ENCOUNTER — Ambulatory Visit (INDEPENDENT_AMBULATORY_CARE_PROVIDER_SITE_OTHER): Payer: Federal, State, Local not specified - PPO | Admitting: Internal Medicine

## 2022-03-30 VITALS — BP 128/85 | HR 71 | Ht 65.0 in | Wt 189.0 lb

## 2022-03-30 DIAGNOSIS — E669 Obesity, unspecified: Secondary | ICD-10-CM

## 2022-03-30 DIAGNOSIS — I10 Essential (primary) hypertension: Secondary | ICD-10-CM

## 2022-03-30 DIAGNOSIS — L720 Epidermal cyst: Secondary | ICD-10-CM

## 2022-03-30 DIAGNOSIS — Z0001 Encounter for general adult medical examination with abnormal findings: Secondary | ICD-10-CM

## 2022-03-30 DIAGNOSIS — E559 Vitamin D deficiency, unspecified: Secondary | ICD-10-CM

## 2022-03-30 DIAGNOSIS — E1169 Type 2 diabetes mellitus with other specified complication: Secondary | ICD-10-CM | POA: Diagnosis not present

## 2022-03-30 NOTE — Assessment & Plan Note (Signed)
Lab Results  Component Value Date   HGBA1C 5.8 (H) 11/10/2021   Associated with HTN and HLD Well controlled On metformin 500 mg daily On Wegovy for weight loss Advised to follow diabetic diet On statin and ACEi F/u CMP and lipid panel Diabetic eye exam: Advised to follow up with Ophthalmology for diabetic eye exam

## 2022-03-30 NOTE — Assessment & Plan Note (Signed)
Annual exam as documented. Counseling done  re healthy lifestyle involving commitment to 150 minutes exercise per week, heart healthy diet, and attaining healthy weight.The importance of adequate sleep also discussed. Changes in health habits are decided on by the patient with goals and time frames  set for achieving them. Immunization and cancer screening needs are specifically addressed at this visit. 

## 2022-03-30 NOTE — Assessment & Plan Note (Signed)
BP Readings from Last 1 Encounters:  03/30/22 128/85   Well-controlled with Lisinopril-HCTZ Counseled for compliance with the medications Advised DASH diet and moderate exercise/walking, at least 150 mins/week

## 2022-03-30 NOTE — Progress Notes (Signed)
Established Patient Office Visit  Subjective:  Patient ID: Teresa Russell, female    DOB: 1970/10/19  Age: 51 y.o. MRN: [id number]  CC:  Chief Complaint  Patient presents with   Annual Exam    CPE concerned cyst maybe returning on back.    HPI Teresa Russell is a 51 y.o. female with past medical history of HTN, DM, allergic rhinitis and obesity who presents for annual physical.  Type II DM and obesity: She has been tolerating Wegovy well.  She has lost about 23 pounds since starting Wegovy.  She denies any major nausea or bloating currently. She is also on Metformin currently. Denies any polyuria or polydipsia.   She takes Crestor for HLD.   HTN: BP is well-controlled. Takes medications regularly. Patient denies headache, dizziness, chest pain, dyspnea or palpitations.   She reports recurrence of cyst over upper back.  She has history of infected epidermal cyst, which required I&D in the past.  She reports mild tenderness in the upper back area with increased bulging recently.  She denies any bleeding or discharge from the site.  Denies any fever or chills.  She prefers to wait for Shingrix vaccine for now.  Past Medical History:  Diagnosis Date   Arthritis    wrists and right hip, right knee   Diabetes mellitus without complication (HCC)    GERD (gastroesophageal reflux disease)    Hyperlipidemia    Hypertension    Pneumonia    Sleep apnea     Past Surgical History:  Procedure Laterality Date   ABDOMINAL HYSTERECTOMY  2010   for heavy bleeding   BIOPSY  09/21/2021   Procedure: BIOPSY;  Surgeon: Eloise Harman, DO;  [facility];  Service: Endoscopy;;   COLONOSCOPY N/A 10/14/2014   Procedure: COLONOSCOPY;  Surgeon: Danie Binder, MD;  [facility];  Service: Endoscopy;  Laterality: N/A;  115 - moved to 11:15 - office notified pt   COLONOSCOPY WITH PROPOFOL N/A 09/21/2021   Procedure: COLONOSCOPY WITH PROPOFOL;  Surgeon: Eloise Harman, DO;   [facility];  Service: Endoscopy;  Laterality: N/A;  9:30 / ASA 2   POLYPECTOMY  09/21/2021   Procedure: POLYPECTOMY;  Surgeon: Eloise Harman, DO;  [facility];  Service: Endoscopy;;    Family History  Problem Relation Age of Onset   Colon cancer Mother 74       passed away age 40   Breast cancer Maternal Aunt        currently treating    Social History   Socioeconomic History   Marital status: Single    Spouse name: Not on file   Number of children: Not on file   Years of education: Not on file   Highest education level: Not on file  Occupational History   Not on file  Tobacco Use   Smoking status: Former    Types: Cigarettes    Quit date: 05/02/2012    Years since quitting: 9.9   Smokeless tobacco: Never  Vaping Use   Vaping Use: Never used  Substance and Sexual Activity   Alcohol use: Not Currently    Comment: Last drink 2012; used to drink a case of beer every Friday   Drug use: No   Sexual activity: Yes  Other Topics Concern   Not on file  Social History Narrative   Not on file   Social Determinants of Health   Financial Resource Strain: Not  on file  Food Insecurity: Not on file  Transportation Needs: Not on file  Physical Activity: Not on file  Stress: Not on file  Social Connections: Not on file  Intimate Partner Violence: Not on file    Outpatient Medications Prior to Visit  Medication Sig Dispense Refill   B Complex-C (VITAMIN B + C COMPLEX) TABS Take by mouth daily at 6 (six) AM. Plus Zinc     calcium-vitamin D (OSCAL WITH D) 250-125 MG-UNIT tablet Take 1 tablet by mouth daily.     fluticasone (FLONASE) 50 MCG/ACT nasal spray Place 2 sprays into both nostrils daily. 16 g 5   ibuprofen (ADVIL,MOTRIN) 200 MG tablet Take 400 mg by mouth daily.      ipratropium (ATROVENT) 0.03 % nasal spray Place 2 sprays into both nostrils every 12 (twelve) hours. 30 mL 0   lisinopril-hydrochlorothiazide (ZESTORETIC) 10-12.5 MG tablet Take 1  tablet by mouth daily. 90 tablet 3   LYSINE ACETATE PO Take by mouth daily at 6 (six) AM.     metFORMIN (GLUCOPHAGE-XR) 500 MG 24 hr tablet Take 1 tablet by mouth once daily with breakfast 90 tablet 0   omeprazole (PRILOSEC) 20 MG capsule Take 1 capsule (20 mg total) by mouth daily as needed. 30 capsule 3   rosuvastatin (CRESTOR) 10 MG tablet Take 1 tablet by mouth once daily 90 tablet 0   Semaglutide-Weight Management 2.4 MG/0.75ML SOAJ Inject 2.4 mg into the skin once a week. 3 mL 3   Simethicone (PHAZYME PO) Take 1 capsule by mouth as needed.     UNABLE TO FIND daily at 6 (six) AM. Med Name: Burn 60  Takes 2 tablets daily.     VITAMIN D PO Take by mouth daily at 6 (six) AM.     No facility-administered medications prior to visit.    Allergies  Allergen Reactions   Wellbutrin [Bupropion] Other (See Comments)    Spinal pain    ROS Review of Systems  Constitutional:  Negative for chills and fever.  HENT:  Negative for congestion, sinus pressure, sinus pain and sore throat.   Eyes:  Negative for pain and discharge.  Respiratory:  Negative for cough and shortness of breath.   Cardiovascular:  Negative for chest pain and palpitations.  Gastrointestinal:  Negative for diarrhea, nausea and vomiting.  Endocrine: Negative for polydipsia and polyuria.  Genitourinary:  Negative for dysuria, flank pain, hematuria, vaginal discharge and vaginal pain.  Musculoskeletal:  Negative for neck pain and neck stiffness.  Skin:  Positive for rash.       Cyst over upper back  Neurological:  Negative for dizziness and weakness.  Psychiatric/Behavioral:  Negative for agitation and behavioral problems.       Objective:    Physical Exam Vitals reviewed.  Constitutional:      General: She is not in acute distress.    Appearance: She is not diaphoretic.  HENT:     Head: Normocephalic and atraumatic.     Nose: No congestion.     Comments: DNS    Mouth/Throat:     Mouth: Mucous membranes are dry.   Eyes:     General: No scleral icterus.    Extraocular Movements: Extraocular movements intact.  Cardiovascular:     Rate and Rhythm: Normal rate and regular rhythm.     Pulses: Normal pulses.     Heart sounds: Normal heart sounds. No murmur heard. Pulmonary:     Breath sounds: Normal breath sounds. No wheezing or  rales.  Abdominal:     Hernia: A hernia (Umbilical) is present.  Musculoskeletal:     Cervical back: Neck supple. No tenderness.     Right lower leg: No edema.     Left lower leg: No edema.  Skin:    General: Skin is warm.     Findings: Rash (Whitish patch over extensor surface of elbow region) present.     Comments: Epidermoid cyst over upper back, about 2 cm in diameter, no discharge or bleeding noted Hyperpigmentation in axillary and neck area -likely acanthosis nigricans  Neurological:     General: No focal deficit present.     Mental Status: She is alert and oriented to person, place, and time.     Sensory: No sensory deficit.     Motor: No weakness.  Psychiatric:        Mood and Affect: Mood normal.        Behavior: Behavior normal.     BP 128/85 (BP Location: Right Arm, Patient Position: Sitting, Cuff Size: Large)   Pulse 71   Ht _0  (1.651 m)   Wt 189 lb (85.7 kg)   SpO2 96%   BMI 31.45 kg/m  Wt Readings from Last 3 Encounters:  03/30/22 189 lb (85.7 kg)  01/26/22 186 lb 9.6 oz (84.6 kg)  11/10/21 189 lb (85.7 kg)    Lab Results  Component Value Date   TSH 1.190 12/30/2020   Lab Results  Component Value Date   WBC 5.8 12/30/2020   HGB 13.9 12/30/2020   HCT 40.4 12/30/2020   MCV 83 12/30/2020   PLT 283 12/30/2020   Lab Results  Component Value Date   NA 142 11/10/2021   K 4.0 11/10/2021   CO2 24 11/10/2021   GLUCOSE 99 11/10/2021   BUN 11 11/10/2021   CREATININE 0.67 11/10/2021   BILITOT 0.5 11/10/2021   ALKPHOS 70 11/10/2021   AST 24 11/10/2021   ALT 29 11/10/2021   PROT 7.0 11/10/2021   ALBUMIN 4.7 11/10/2021   CALCIUM 10.2  11/10/2021   ANIONGAP 5 09/20/2021   EGFR 107 11/10/2021   Lab Results  Component Value Date   CHOL 139 11/10/2021   Lab Results  Component Value Date   HDL 51 11/10/2021   Lab Results  Component Value Date   LDLCALC 68 11/10/2021   Lab Results  Component Value Date   TRIG 109 11/10/2021   Lab Results  Component Value Date   CHOLHDL 2.7 11/10/2021   Lab Results  Component Value Date   HGBA1C 5.8 (H) 11/10/2021      Assessment & Plan:   Problem List Items Addressed This Visit       Cardiovascular and Mediastinum   Hypertension    BP Readings from Last 1 Encounters:  03/30/22 128/85  Well-controlled with Lisinopril-HCTZ Counseled for compliance with the medications Advised DASH diet and moderate exercise/walking, at least 150 mins/week      Relevant Orders   TSH   CBC with Differential/Platelet     Endocrine   DM (diabetes mellitus) ([place])    Lab Results  Component Value Date   HGBA1C 5.8 (H) 11/10/2021  Associated with HTN and HLD Well controlled On metformin 500 mg daily On Wegovy for weight loss Advised to follow diabetic diet On statin and ACEi F/u CMP and lipid panel Diabetic eye exam: Advised to follow up with Ophthalmology for diabetic eye exam      Relevant Orders   Hemoglobin A1c  CMP14+EGFR     Musculoskeletal and Integument   Epidermoid cyst of skin    Slight tenderness and increasing size of cyst over upper back Advised to contact dermatology for possible drainage        Other   Encounter for general adult medical examination with abnormal findings - Primary    Annual exam as documented. Counseling done  re healthy lifestyle involving commitment to 150 minutes exercise per week, heart healthy diet, and attaining healthy weight.The importance of adequate sleep also discussed. Changes in health habits are decided on by the patient with goals and time frames  set for achieving them. Immunization and cancer screening needs are  specifically addressed at this visit.      Relevant Orders   TSH   CMP14+EGFR   CBC with Differential/Platelet   Obesity (BMI 30-39.9)    BMI Readings from Last 3 Encounters:  03/30/22 31.45 kg/m  01/26/22 31.05 kg/m  11/10/21 31.45 kg/m   Advised to follow low-carb diet and perform moderate exercise at least 150 minutes/week Initial BMI - 34.74, on Wegovy - tolerating well      Other Visit Diagnoses     Vitamin D deficiency       Relevant Orders   VITAMIN D 25 Hydroxy (Vit-D Deficiency, Fractures)       No orders of the defined types were placed in this encounter.   Follow-up: Return in about 4 months (around 07/29/2022) for DM and weight management.    Lindell Spar, MD

## 2022-03-30 NOTE — Assessment & Plan Note (Signed)
BMI Readings from Last 3 Encounters:  03/30/22 31.45 kg/m  01/26/22 31.05 kg/m  11/10/21 31.45 kg/m    Advised to follow low-carb diet and perform moderate exercise at least 150 minutes/week Initial BMI - 34.74, on Wegovy - tolerating well

## 2022-03-30 NOTE — Patient Instructions (Signed)
Please continue to take medications as prescribed.  Please contact your Dermatologist for epidermal cyst on upper back.  Please continue to follow low carb diet and perform moderate exercise/walking at least 150 mins/week.

## 2022-03-30 NOTE — Assessment & Plan Note (Signed)
Slight tenderness and increasing size of cyst over upper back Advised to contact dermatology for possible drainage

## 2022-03-31 LAB — CMP14+EGFR
ALT: 70 IU/L — ABNORMAL HIGH (ref 0–32)
AST: 54 IU/L — ABNORMAL HIGH (ref 0–40)
Albumin/Globulin Ratio: 1.8 (ref 1.2–2.2)
Albumin: 4.6 g/dL (ref 3.9–4.9)
Alkaline Phosphatase: 76 IU/L (ref 44–121)
BUN/Creatinine Ratio: 18 (ref 9–23)
BUN: 10 mg/dL (ref 6–24)
Bilirubin Total: 0.4 mg/dL (ref 0.0–1.2)
CO2: 23 mmol/L (ref 20–29)
Calcium: 9.9 mg/dL (ref 8.7–10.2)
Chloride: 102 mmol/L (ref 96–106)
Creatinine, Ser: 0.56 mg/dL — ABNORMAL LOW (ref 0.57–1.00)
Globulin, Total: 2.5 g/dL (ref 1.5–4.5)
Glucose: 116 mg/dL — ABNORMAL HIGH (ref 70–99)
Potassium: 4 mmol/L (ref 3.5–5.2)
Sodium: 139 mmol/L (ref 134–144)
Total Protein: 7.1 g/dL (ref 6.0–8.5)
eGFR: 111 mL/min/{1.73_m2} (ref >59)

## 2022-03-31 LAB — CBC WITH DIFFERENTIAL/PLATELET
Basophils Absolute: 0.1 10*3/uL (ref 0.0–0.2)
Basos: 1 %
EOS (ABSOLUTE): 0.2 10*3/uL (ref 0.0–0.4)
Eos: 5 %
Hematocrit: 38.2 % (ref 34.0–46.6)
Hemoglobin: 13.5 g/dL (ref 11.1–15.9)
Immature Grans (Abs): 0 10*3/uL (ref 0.0–0.1)
Immature Granulocytes: 0 %
Lymphocytes Absolute: 2.8 10*3/uL (ref 0.7–3.1)
Lymphs: 54 %
MCH: 29.3 pg (ref 26.6–33.0)
MCHC: 35.3 g/dL (ref 31.5–35.7)
MCV: 83 fL (ref 79–97)
Monocytes Absolute: 0.3 10*3/uL (ref 0.1–0.9)
Monocytes: 6 %
Neutrophils Absolute: 1.7 10*3/uL (ref 1.4–7.0)
Neutrophils: 34 %
Platelets: 320 10*3/uL (ref 150–450)
RBC: 4.6 x10E6/uL (ref 3.77–5.28)
RDW: 11.4 % — ABNORMAL LOW (ref 11.7–15.4)
WBC: 5.1 10*3/uL (ref 3.4–10.8)

## 2022-03-31 LAB — HEMOGLOBIN A1C
Est. average glucose Bld gHb Est-mCnc: 117 mg/dL
Hgb A1c MFr Bld: 5.7 % — ABNORMAL HIGH (ref 4.8–5.6)

## 2022-03-31 LAB — TSH: TSH: 1.07 u[IU]/mL (ref 0.450–4.500)

## 2022-03-31 LAB — VITAMIN D 25 HYDROXY (VIT D DEFICIENCY, FRACTURES): Vit D, 25-Hydroxy: 50.9 ng/mL (ref 30.0–100.0)

## 2022-05-16 DIAGNOSIS — B9689 Other specified bacterial agents as the cause of diseases classified elsewhere: Secondary | ICD-10-CM | POA: Diagnosis not present

## 2022-05-16 DIAGNOSIS — L918 Other hypertrophic disorders of the skin: Secondary | ICD-10-CM | POA: Diagnosis not present

## 2022-05-16 DIAGNOSIS — L02229 Furuncle of trunk, unspecified: Secondary | ICD-10-CM | POA: Diagnosis not present

## 2022-05-16 DIAGNOSIS — L720 Epidermal cyst: Secondary | ICD-10-CM | POA: Diagnosis not present

## 2022-05-17 ENCOUNTER — Encounter: Payer: Self-pay | Admitting: Internal Medicine

## 2022-05-17 ENCOUNTER — Ambulatory Visit: Payer: Federal, State, Local not specified - PPO | Admitting: Internal Medicine

## 2022-05-17 ENCOUNTER — Other Ambulatory Visit (HOSPITAL_COMMUNITY): Payer: Self-pay | Admitting: Internal Medicine

## 2022-05-17 VITALS — BP 138/84 | HR 84 | Ht 65.0 in | Wt 189.0 lb

## 2022-05-17 DIAGNOSIS — N6322 Unspecified lump in the left breast, upper inner quadrant: Secondary | ICD-10-CM

## 2022-05-17 DIAGNOSIS — F331 Major depressive disorder, recurrent, moderate: Secondary | ICD-10-CM | POA: Diagnosis not present

## 2022-05-17 DIAGNOSIS — I10 Essential (primary) hypertension: Secondary | ICD-10-CM

## 2022-05-17 DIAGNOSIS — N63 Unspecified lump in unspecified breast: Secondary | ICD-10-CM

## 2022-05-17 DIAGNOSIS — E669 Obesity, unspecified: Secondary | ICD-10-CM | POA: Diagnosis not present

## 2022-05-17 MED ORDER — SERTRALINE HCL 25 MG PO TABS: 25.0000 mg | ORAL_TABLET | Freq: Every day | ORAL | 3 refills | Status: DC

## 2022-05-17 NOTE — Assessment & Plan Note (Signed)
BP Readings from Last 1 Encounters:  05/17/22 138/84   Well-controlled with Lisinopril-HCTZ Counseled for compliance with the medications Advised DASH diet and moderate exercise/walking, at least 150 mins/week

## 2022-05-17 NOTE — Assessment & Plan Note (Signed)
BMI Readings from Last 3 Encounters:  05/17/22 31.45 kg/m  03/30/22 31.45 kg/m  01/26/22 31.05 kg/m    Advised to follow low-carb diet and perform moderate exercise at least 150 minutes/week Initial BMI - 34.74, on Wegovy - tolerating well

## 2022-05-17 NOTE — Patient Instructions (Signed)
Please start taking Zoloft as prescribed.  You are being scheduled to get Mammography and [place] of breast.

## 2022-05-17 NOTE — Progress Notes (Signed)
Acute Office Visit  Subjective:    Patient ID: Teresa Russell, female    DOB: 22-Aug-1970, 52 y.o.   MRN: [id number]  Chief Complaint  Patient presents with   Breast Problem    Patient felt lump in her left breast     HPI Patient is in today for concern for left breast lump that she noticed in the last week while taking shower.  She noticed a slightly tender lump on the upper inner side of left breast.  Her last mammography was in 09/22 -unremarkable.  She denies any breast pain, discharge or bleeding currently.  MDD: She is still feeling apathy and anxious since losing her daughter more than a year ago. She has lack of interest in routine activities, increased appetite and insomnia. She feels more anxious around holiday season.  She denies any SI or HI currently.  She feels overwhelmed with trying to do everything right - such as managing her weight, blood pressure, blood sugar and keeping her household chores.  Past Medical History:  Diagnosis Date   Arthritis    wrists and right hip, right knee   Diabetes mellitus without complication (HCC)    GERD (gastroesophageal reflux disease)    Hyperlipidemia    Hypertension    Pneumonia    Sleep apnea     Past Surgical History:  Procedure Laterality Date   ABDOMINAL HYSTERECTOMY  2010   for heavy bleeding   BIOPSY  09/21/2021   Procedure: BIOPSY;  Surgeon: Eloise Harman, DO;  [facility];  Service: Endoscopy;;   COLONOSCOPY N/A 10/14/2014   Procedure: COLONOSCOPY;  Surgeon: Danie Binder, MD;  [facility];  Service: Endoscopy;  Laterality: N/A;  115 - moved to 11:15 - office notified pt   COLONOSCOPY WITH PROPOFOL N/A 09/21/2021   Procedure: COLONOSCOPY WITH PROPOFOL;  Surgeon: Eloise Harman, DO;  [facility];  Service: Endoscopy;  Laterality: N/A;  9:30 / ASA 2   POLYPECTOMY  09/21/2021   Procedure: POLYPECTOMY;  Surgeon: Eloise Harman, DO;  [facility];  Service:  Endoscopy;;    Family History  Problem Relation Age of Onset   Colon cancer Mother 96       passed away age 51   Breast cancer Maternal Aunt        currently treating    Social History   Socioeconomic History   Marital status: Single    Spouse name: Not on file   Number of children: Not on file   Years of education: Not on file   Highest education level: Not on file  Occupational History   Not on file  Tobacco Use   Smoking status: Former    Types: Cigarettes    Quit date: 05/02/2012    Years since quitting: 10.0   Smokeless tobacco: Never  Vaping Use   Vaping Use: Never used  Substance and Sexual Activity   Alcohol use: Not Currently    Comment: Last drink 2012; used to drink a case of beer every Friday   Drug use: No   Sexual activity: Yes  Other Topics Concern   Not on file  Social History Narrative   Not on file   Social Determinants of Health   Financial Resource Strain: Not on file  Food Insecurity: Not on file  Transportation Needs: Not on file  Physical Activity: Not on file  Stress: Not on file  Social Connections: Not on file  Intimate Partner Violence: Not on file    Outpatient Medications Prior to Visit  Medication Sig Dispense Refill   clindamycin (CLEOCIN T) 1 % lotion Apply topically.     doxycycline (VIBRAMYCIN) 100 MG capsule Take 100 mg by mouth 2 (two) times daily.     B Complex-C (VITAMIN B + C COMPLEX) TABS Take by mouth daily at 6 (six) AM. Plus Zinc     calcium-vitamin D (OSCAL WITH D) 250-125 MG-UNIT tablet Take 1 tablet by mouth daily.     fluticasone (FLONASE) 50 MCG/ACT nasal spray Place 2 sprays into both nostrils daily. 16 g 5   ibuprofen (ADVIL,MOTRIN) 200 MG tablet Take 400 mg by mouth daily.      ipratropium (ATROVENT) 0.03 % nasal spray Place 2 sprays into both nostrils every 12 (twelve) hours. 30 mL 0   lisinopril-hydrochlorothiazide (ZESTORETIC) 10-12.5 MG tablet Take 1 tablet by mouth daily. 90 tablet 3   LYSINE ACETATE PO  Take by mouth daily at 6 (six) AM.     metFORMIN (GLUCOPHAGE-XR) 500 MG 24 hr tablet Take 1 tablet by mouth once daily with breakfast 90 tablet 0   omeprazole (PRILOSEC) 20 MG capsule Take 1 capsule (20 mg total) by mouth daily as needed. 30 capsule 3   rosuvastatin (CRESTOR) 10 MG tablet Take 1 tablet by mouth once daily 90 tablet 0   Semaglutide-Weight Management 2.4 MG/0.75ML SOAJ Inject 2.4 mg into the skin once a week. 3 mL 3   Simethicone (PHAZYME PO) Take 1 capsule by mouth as needed.     UNABLE TO FIND daily at 6 (six) AM. Med Name: Burn 60  Takes 2 tablets daily.     VITAMIN D PO Take by mouth daily at 6 (six) AM.     No facility-administered medications prior to visit.    Allergies  Allergen Reactions   Wellbutrin [Bupropion] Other (See Comments)    Spinal pain    Review of Systems  Constitutional:  Negative for chills and fever.  HENT:  Negative for congestion, sinus pressure, sinus pain and sore throat.   Eyes:  Negative for pain and discharge.  Respiratory:  Negative for cough and shortness of breath.   Cardiovascular:  Negative for chest pain and palpitations.  Gastrointestinal:  Negative for diarrhea, nausea and vomiting.  Endocrine: Negative for polydipsia and polyuria.  Genitourinary:  Negative for dysuria, flank pain, hematuria, vaginal discharge and vaginal pain.  Musculoskeletal:  Negative for neck pain and neck stiffness.  Skin:  Positive for rash.       Cyst over upper back  Neurological:  Negative for dizziness and weakness.  Psychiatric/Behavioral:  Positive for decreased concentration, dysphoric mood and sleep disturbance. Negative for agitation and behavioral problems. The patient is nervous/anxious.        Objective:    Physical Exam Vitals reviewed.  Constitutional:      General: She is not in acute distress.    Appearance: She is not diaphoretic.  HENT:     Head: Normocephalic and atraumatic.     Nose: No congestion.     Comments: DNS     Mouth/Throat:     Mouth: Mucous membranes are dry.  Eyes:     General: No scleral icterus.    Extraocular Movements: Extraocular movements intact.  Cardiovascular:     Rate and Rhythm: Normal rate and regular rhythm.     Pulses: Normal pulses.     Heart sounds: Normal heart sounds. No murmur heard. Pulmonary:  Breath sounds: Normal breath sounds. No wheezing or rales.  Abdominal:     Hernia: A hernia (Umbilical) is present.  Musculoskeletal:     Cervical back: Neck supple. No tenderness.     Right lower leg: No edema.     Left lower leg: No edema.  Skin:    General: Skin is warm.     Findings: Rash (Whitish patch over extensor surface of elbow region) present.     Comments: Hyperpigmentation in axillary and neck area -likely acanthosis nigricans  Neurological:     General: No focal deficit present.     Mental Status: She is alert and oriented to person, place, and time.     Sensory: No sensory deficit.     Motor: No weakness.  Psychiatric:        Mood and Affect: Mood normal.        Behavior: Behavior normal.     BP 138/84 (BP Location: Right Arm, Patient Position: Sitting, Cuff Size: Normal)   Pulse 84   Ht '5\' 5"'$  (1.651 m)   Wt 189 lb (85.7 kg)   SpO2 96%   BMI 31.45 kg/m  Wt Readings from Last 3 Encounters:  05/17/22 189 lb (85.7 kg)  03/30/22 189 lb (85.7 kg)  01/26/22 186 lb 9.6 oz (84.6 kg)        Assessment & Plan:   Problem List Items Addressed This Visit       Cardiovascular and Mediastinum   Hypertension    BP Readings from Last 1 Encounters:  05/17/22 138/84  Well-controlled with Lisinopril-HCTZ Counseled for compliance with the medications Advised DASH diet and moderate exercise/walking, at least 150 mins/week        Other   Obesity (BMI 30-39.9)    BMI Readings from Last 3 Encounters:  05/17/22 31.45 kg/m  03/30/22 31.45 kg/m  01/26/22 31.05 kg/m   Advised to follow low-carb diet and perform moderate exercise at least 150  minutes/week Initial BMI - 34.74, on Wegovy - tolerating well      Mass of upper inner quadrant of left breast - Primary    Patient reported mass of upper inner quadrant of left breast No axillary LAD Check diagnostic mammography and [place] of left breast      Relevant Orders   MM Digital Diagnostic Bilat   US BREAST LTD UNI LEFT INC AXILLA   Moderate episode of recurrent major depressive disorder ([place])    [place] Office Visit from 05/17/2022 in [facility]  PHQ-9 Total Score 17     Uncontrolled Started Zoloft 25 mg QD Advised to engage in activities of liking and perform simple exercises or relaxation techniques (yoga or tai chi)      Relevant Medications   sertraline (ZOLOFT) 25 MG tablet     Meds ordered this encounter  Medications   sertraline (ZOLOFT) 25 MG tablet    Sig: Take 1 tablet (25 mg total) by mouth daily.    Dispense:  30 tablet    Refill:  3     Naileah Karg Keith Rake, MD

## 2022-05-17 NOTE — Assessment & Plan Note (Signed)
[place] Office Visit from 05/17/2022 in [facility]  PHQ-9 Total Score 17      Uncontrolled Started Zoloft 25 mg QD Advised to engage in activities of liking and perform simple exercises or relaxation techniques (yoga or tai chi)

## 2022-05-17 NOTE — Assessment & Plan Note (Addendum)
Patient reported mass of upper inner quadrant of left breast No axillary LAD Check diagnostic mammography and [place] of left breast

## 2022-05-25 ENCOUNTER — Other Ambulatory Visit: Payer: Self-pay | Admitting: Internal Medicine

## 2022-05-25 DIAGNOSIS — E782 Mixed hyperlipidemia: Secondary | ICD-10-CM

## 2022-06-07 ENCOUNTER — Encounter (HOSPITAL_COMMUNITY): Payer: Federal, State, Local not specified - PPO

## 2022-06-07 ENCOUNTER — Other Ambulatory Visit: Payer: Self-pay | Admitting: Internal Medicine

## 2022-06-07 ENCOUNTER — Ambulatory Visit (HOSPITAL_COMMUNITY): Payer: Federal, State, Local not specified - PPO

## 2022-06-07 DIAGNOSIS — E669 Obesity, unspecified: Secondary | ICD-10-CM

## 2022-06-08 ENCOUNTER — Encounter: Payer: Self-pay | Admitting: Internal Medicine

## 2022-06-08 ENCOUNTER — Other Ambulatory Visit: Payer: Self-pay | Admitting: Internal Medicine

## 2022-06-08 DIAGNOSIS — K1379 Other lesions of oral mucosa: Secondary | ICD-10-CM

## 2022-06-08 MED ORDER — CHLORHEXIDINE GLUCONATE 0.12 % MT SOLN: OROMUCOSAL | 0 refills | Status: DC

## 2022-06-21 ENCOUNTER — Encounter (HOSPITAL_COMMUNITY): Payer: Self-pay

## 2022-06-21 ENCOUNTER — Ambulatory Visit (HOSPITAL_COMMUNITY)
Admission: RE | Admit: 2022-06-21 | Discharge: 2022-06-21 | Disposition: A | Discharge status: Home / Self Care | Payer: Federal, State, Local not specified - PPO | Source: Ambulatory Visit | Attending: Internal Medicine | Admitting: Internal Medicine

## 2022-06-21 DIAGNOSIS — N6322 Unspecified lump in the left breast, upper inner quadrant: Secondary | ICD-10-CM | POA: Diagnosis not present

## 2022-06-21 DIAGNOSIS — N63 Unspecified lump in unspecified breast: Secondary | ICD-10-CM | POA: Insufficient documentation

## 2022-06-21 DIAGNOSIS — R928 Other abnormal and inconclusive findings on diagnostic imaging of breast: Secondary | ICD-10-CM | POA: Diagnosis not present

## 2022-06-22 ENCOUNTER — Other Ambulatory Visit: Payer: Self-pay

## 2022-06-22 ENCOUNTER — Encounter: Payer: Self-pay | Admitting: Internal Medicine

## 2022-06-22 ENCOUNTER — Telehealth (INDEPENDENT_AMBULATORY_CARE_PROVIDER_SITE_OTHER): Payer: Federal, State, Local not specified - PPO | Admitting: Internal Medicine

## 2022-06-22 DIAGNOSIS — J301 Allergic rhinitis due to pollen: Secondary | ICD-10-CM

## 2022-06-22 DIAGNOSIS — J069 Acute upper respiratory infection, unspecified: Secondary | ICD-10-CM | POA: Diagnosis not present

## 2022-06-22 DIAGNOSIS — J309 Allergic rhinitis, unspecified: Secondary | ICD-10-CM

## 2022-06-22 MED ORDER — FLUTICASONE PROPIONATE 50 MCG/ACT NA SUSP: 2.0000 | Freq: Every day | NASAL | 5 refills | Status: DC

## 2022-06-22 MED ORDER — IPRATROPIUM BROMIDE 0.03 % NA SOLN: 2.0000 | Freq: Two times a day (BID) | NASAL | 0 refills | Status: DC

## 2022-06-22 NOTE — Addendum Note (Signed)
Addended by: Everett Graff D on: 06/22/2022 01:05 PM   Modules accepted: Orders

## 2022-06-22 NOTE — Assessment & Plan Note (Signed)
Refilled Flonase Continue OTC antihistaminic

## 2022-06-22 NOTE — Progress Notes (Signed)
Virtual Visit via Video Note   Because of Teresa Russell's co-morbid illnesses, she is at least at moderate risk for complications without adequate follow up.  This format is felt to be most appropriate for this patient at this time.  All issues noted in this document were discussed and addressed.  A limited physical exam was performed with this format.      Evaluation Performed:  Follow-up visit  Date:  06/22/2022   Teresa Russell, DOB Jan 06, 1971, MRN [id number]  Patient Location: Home Provider Location: Office/Clinic  Participants: Patient Location of Patient: Home Location of Provider: Telehealth Consent was obtain for visit to be over via telehealth. I verified that I am speaking with the correct person using two identifiers.  PCP:  Lindell Spar, MD   Chief Complaint: Nasal congestion, sore throat and fatigue  History of Present Illness:    Teresa Russell is a 52 y.o. female who has a video visit for complaint of nasal congestion, sore throat and fatigue for the last 2 days.  She denies any fever or chills.  Denies any dyspnea or wheezing.  She has tried OTC allergy and sinus medicine and Robitussin with mild relief.  She has not used Flonase recently.  Her home COVID test was negative yesterday.  The patient does have symptoms concerning for COVID-19 infection (fever, chills, cough, or new shortness of breath).   Past Medical, Surgical, Social History, Allergies, and Medications have been Reviewed.  Past Medical History:  Diagnosis Date   Arthritis    wrists and right hip, right knee   Diabetes mellitus without complication (HCC)    GERD (gastroesophageal reflux disease)    Hyperlipidemia    Hypertension    Pneumonia    Sleep apnea    Past Surgical History:  Procedure Laterality Date   ABDOMINAL HYSTERECTOMY  2010   for heavy bleeding   BIOPSY  09/21/2021   Procedure: BIOPSY;  Surgeon: Eloise Harman, DO;  [facility];  Service:  Endoscopy;;   COLONOSCOPY N/A 10/14/2014   Procedure: COLONOSCOPY;  Surgeon: Danie Binder, MD;  [facility];  Service: Endoscopy;  Laterality: N/A;  115 - moved to 11:15 - office notified pt   COLONOSCOPY WITH PROPOFOL N/A 09/21/2021   Procedure: COLONOSCOPY WITH PROPOFOL;  Surgeon: Eloise Harman, DO;  [facility];  Service: Endoscopy;  Laterality: N/A;  9:30 / ASA 2   POLYPECTOMY  09/21/2021   Procedure: POLYPECTOMY;  Surgeon: Eloise Harman, DO;  [facility];  Service: Endoscopy;;     No outpatient medications have been marked as taking for the 06/22/22 encounter (Video Visit) with Lindell Spar, MD.     Allergies:   Wellbutrin [bupropion]   ROS:   Please see the history of present illness.     All other systems reviewed and are negative.   Labs/Other Tests and Data Reviewed:    Recent Labs: 03/30/2022: ALT 70; BUN 10; Creatinine, Ser 0.56; Hemoglobin 13.5; Platelets 320; Potassium 4.0; Sodium 139; TSH 1.070   Recent Lipid Panel Lab Results  Component Value Date/Time   CHOL 139 11/10/2021 09:01 AM   TRIG 109 11/10/2021 09:01 AM   HDL 51 11/10/2021 09:01 AM   CHOLHDL 2.7 11/10/2021 09:01 AM   LDLCALC 68 11/10/2021 09:01 AM    Wt Readings from Last 3 Encounters:  05/17/22 189 lb (85.7 kg)  03/30/22 189 lb (85.7 kg)  01/26/22  186 lb 9.6 oz (84.6 kg)     Objective:    Vital Signs:  There were no vitals taken for this visit.   VITAL SIGNS:  reviewed GEN:  no acute distress EYES:  sclerae anicteric, EOMI - Extraocular Movements Intact RESPIRATORY:  normal respiratory effort, symmetric expansion NEURO:  alert and oriented x 3, no obvious focal deficit PSYCH:  normal affect Nose: Nasal congestion.  ASSESSMENT & PLAN:    URTI Check rapid flu and COVID RT-PCR Continue OTC antihistaminic and nasal decongestant Flonase for allergies Advised to perform warm water gargling for sore throat, can try phenol throat spray as  well If persistent symptoms and negative viral testing, will start antibiotic  Allergic rhinitis Refilled Flonase Continue OTC antihistaminic   I discussed the assessment and treatment plan with the patient. The patient was provided an opportunity to ask questions, and all were answered. The patient agreed with the plan and demonstrated an understanding of the instructions.   The patient was advised to call back or seek an in-person evaluation if the symptoms worsen or if the condition fails to improve as anticipated.  The above assessment and management plan was discussed with the patient. The patient verbalized understanding of and has agreed to the management plan.   Medication Adjustments/Labs and Tests Ordered: Current medicines are reviewed at length with the patient today.  Concerns regarding medicines are outlined above.   Tests Ordered: No orders of the defined types were placed in this encounter.   Medication Changes: No orders of the defined types were placed in this encounter.    Note: This dictation was prepared with Dragon dictation along with smaller phrase technology. Similar sounding words can be transcribed inadequately or may not be corrected upon review. Any transcriptional errors that result from this process are unintentional.      Disposition:  Follow up  Signed, Lindell Spar, MD  06/22/2022 11:41 AM     [place]

## 2022-06-22 NOTE — Patient Instructions (Signed)
Please come to office for rapid flu and COVID RT-PCR testing.

## 2022-06-23 ENCOUNTER — Encounter: Payer: Self-pay | Admitting: Internal Medicine

## 2022-06-29 ENCOUNTER — Encounter: Payer: Self-pay | Admitting: Internal Medicine

## 2022-06-29 LAB — COVID-19, FLU A+B AND RSV
Influenza A, NAA: NOT DETECTED
Influenza B, NAA: NOT DETECTED
RSV, NAA: NOT DETECTED
SARS-CoV-2, NAA: NOT DETECTED

## 2022-06-29 NOTE — Telephone Encounter (Signed)
Spoke with patient.

## 2022-07-02 ENCOUNTER — Other Ambulatory Visit: Payer: Self-pay | Admitting: Internal Medicine

## 2022-07-02 DIAGNOSIS — E669 Obesity, unspecified: Secondary | ICD-10-CM

## 2022-07-04 ENCOUNTER — Encounter: Payer: Self-pay | Admitting: Internal Medicine

## 2022-07-05 ENCOUNTER — Other Ambulatory Visit: Payer: Self-pay | Admitting: Internal Medicine

## 2022-07-05 DIAGNOSIS — J209 Acute bronchitis, unspecified: Secondary | ICD-10-CM

## 2022-07-05 MED ORDER — BENZONATATE 100 MG PO CAPS: 100.0000 mg | ORAL_CAPSULE | Freq: Two times a day (BID) | ORAL | 0 refills | Status: DC | PRN

## 2022-07-05 MED ORDER — METHYLPREDNISOLONE 4 MG PO TBPK: ORAL_TABLET | ORAL | 0 refills | Status: DC

## 2022-07-05 MED ORDER — AZITHROMYCIN 250 MG PO TABS: ORAL_TABLET | ORAL | 0 refills | Status: AC

## 2022-07-12 ENCOUNTER — Other Ambulatory Visit: Payer: Self-pay | Admitting: Internal Medicine

## 2022-07-23 IMAGING — MG MM DIGITAL SCREENING BILAT W/ TOMO AND CAD
8 series · 8 of 24 positions shown · non-contrast
Comparison: None.

CLINICAL DATA: Screening.

EXAM:
DIGITAL SCREENING BILATERAL MAMMOGRAM WITH TOMOSYNTHESIS AND CAD
TECHNIQUE: Bilateral screening digital craniocaudal and mediolateral oblique
mammograms were obtained. Bilateral screening digital breast
tomosynthesis was performed. The images were evaluated with
computer-aided detection.

[R MLO synth-2D]
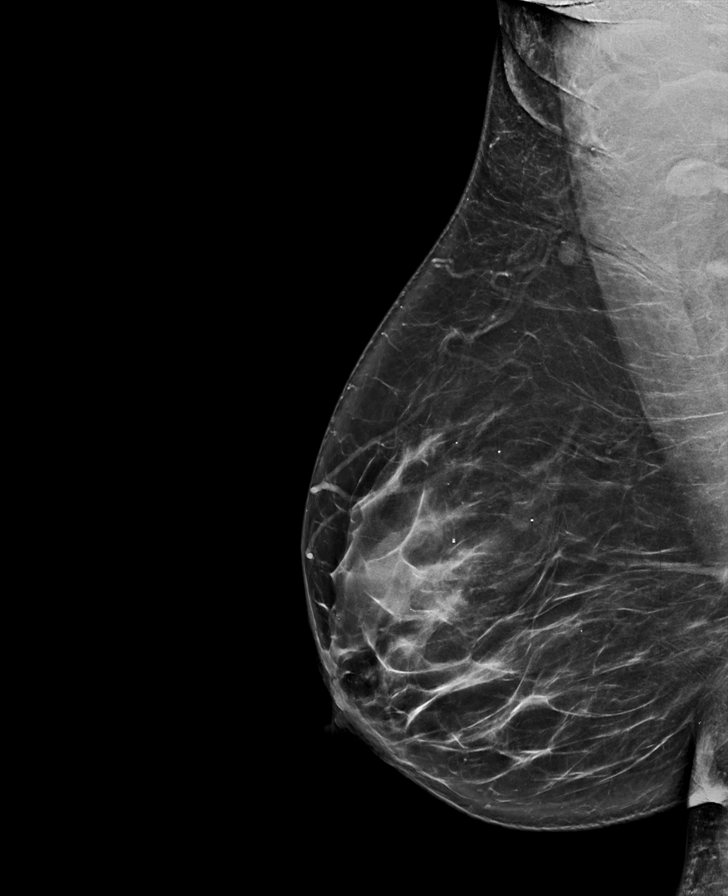

[L MLO synth-2D]
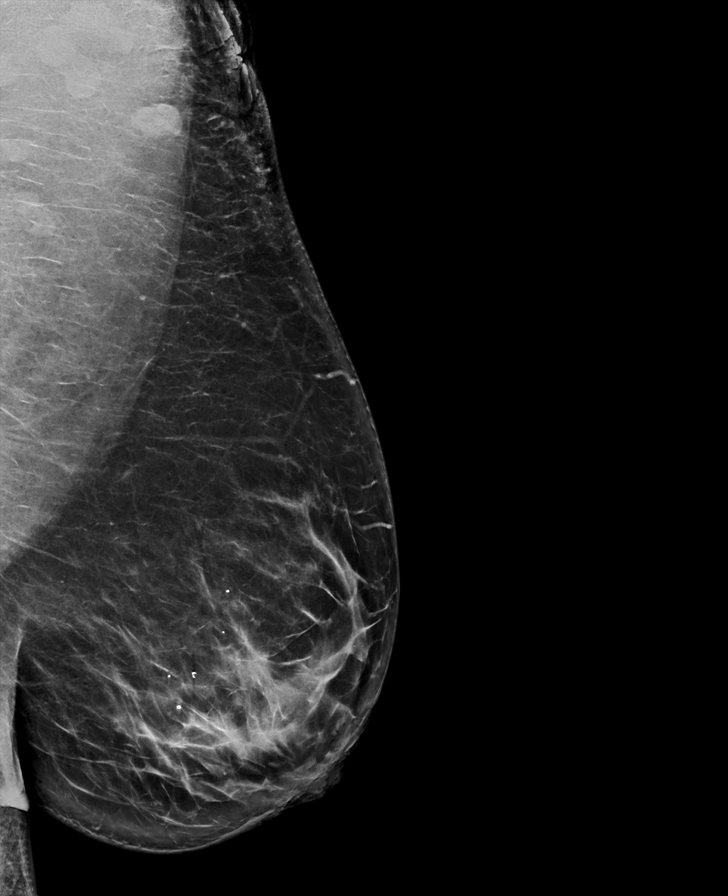

[L CC synth-2D]
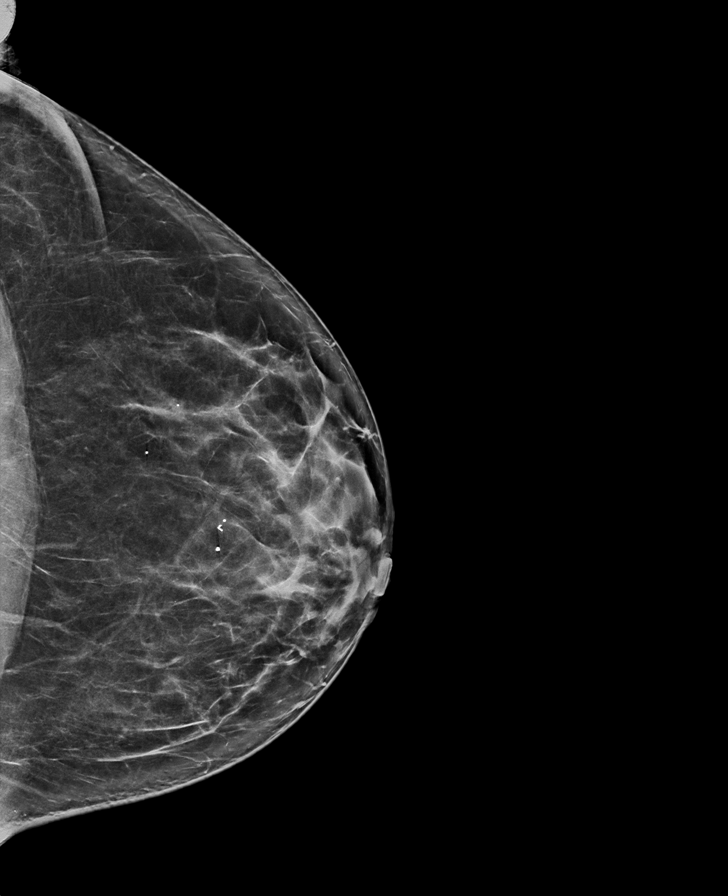

[R CC synth-2D]
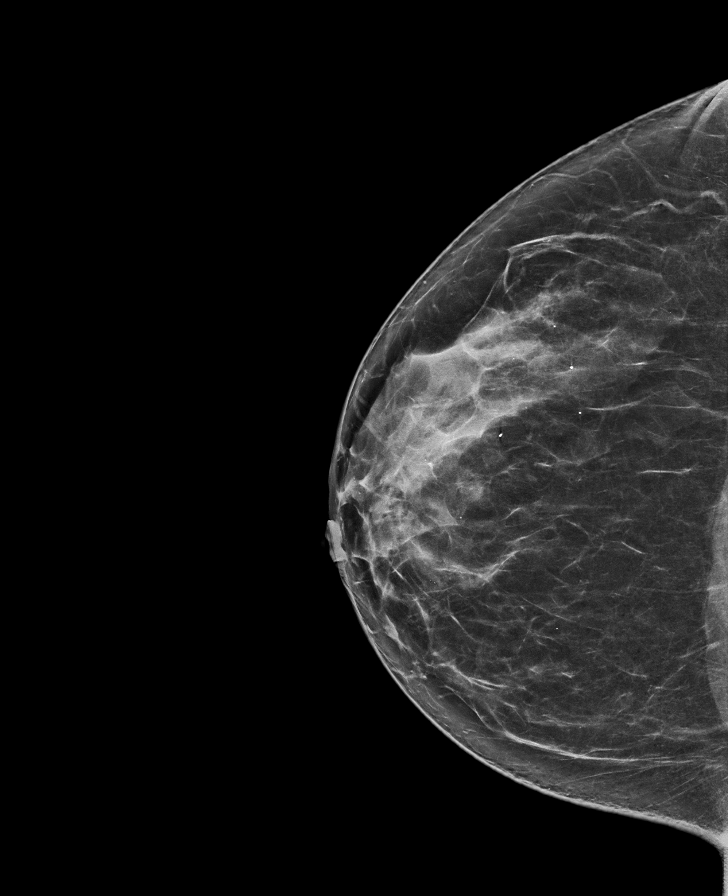

[L MLO tomo · tomo slice 46/91.0]
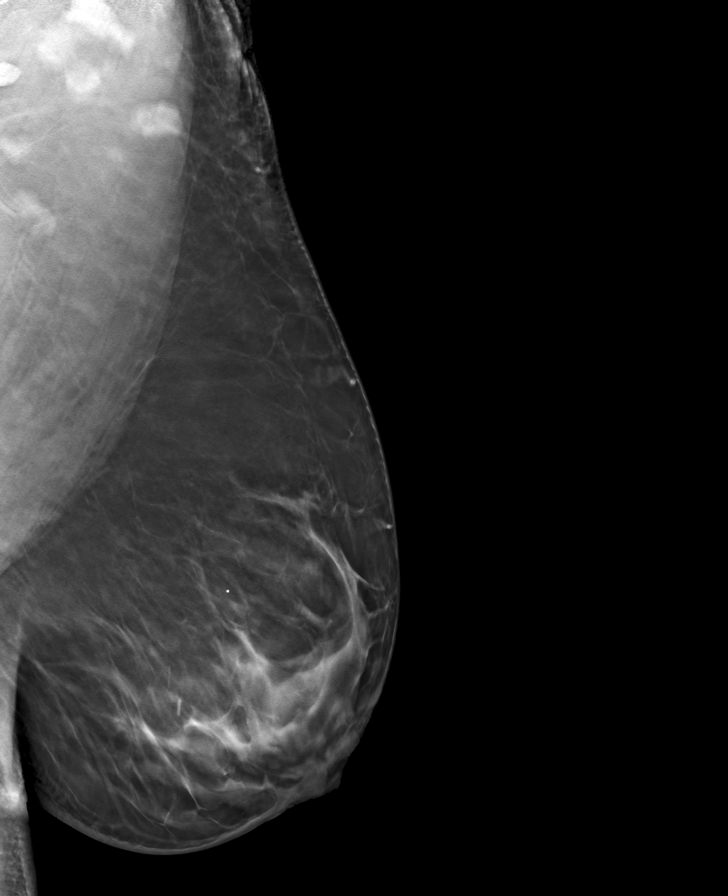

[R MLO tomo · tomo slice 45/88.0]
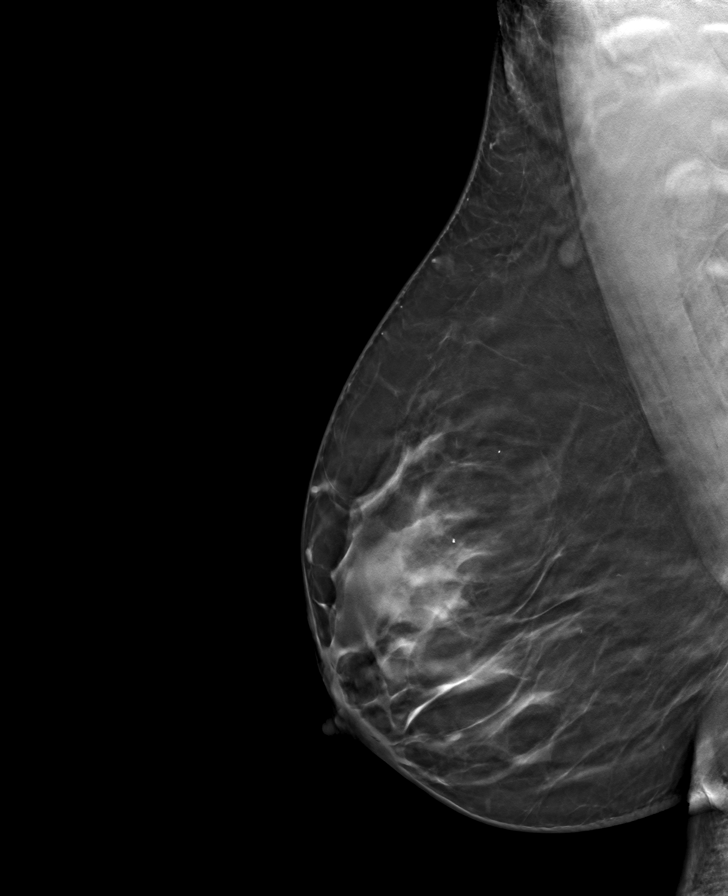

[R CC tomo · tomo slice 39/78.0]
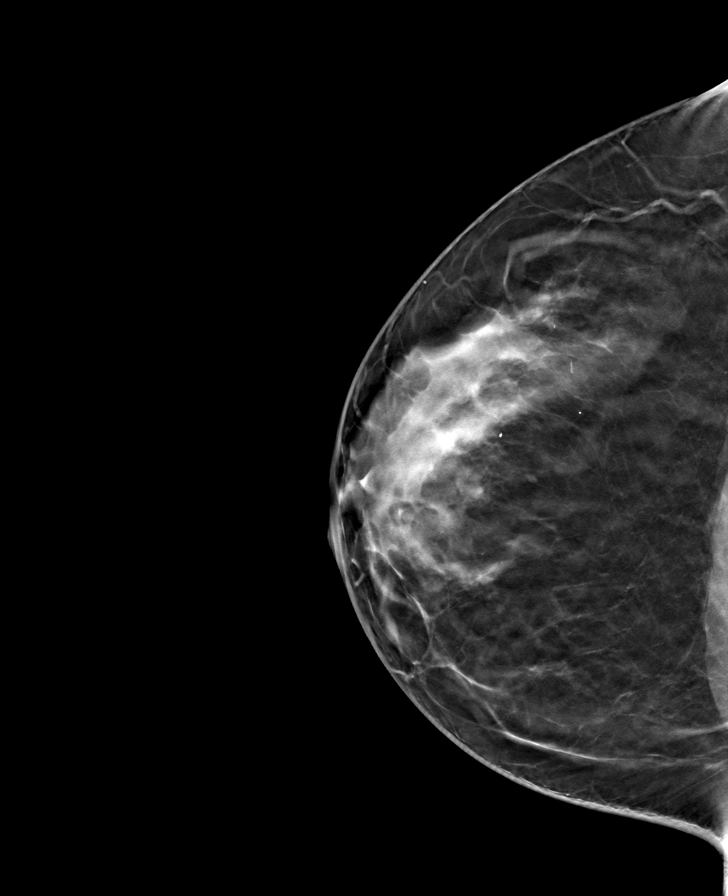

[L CC tomo · tomo slice 40/79.0]
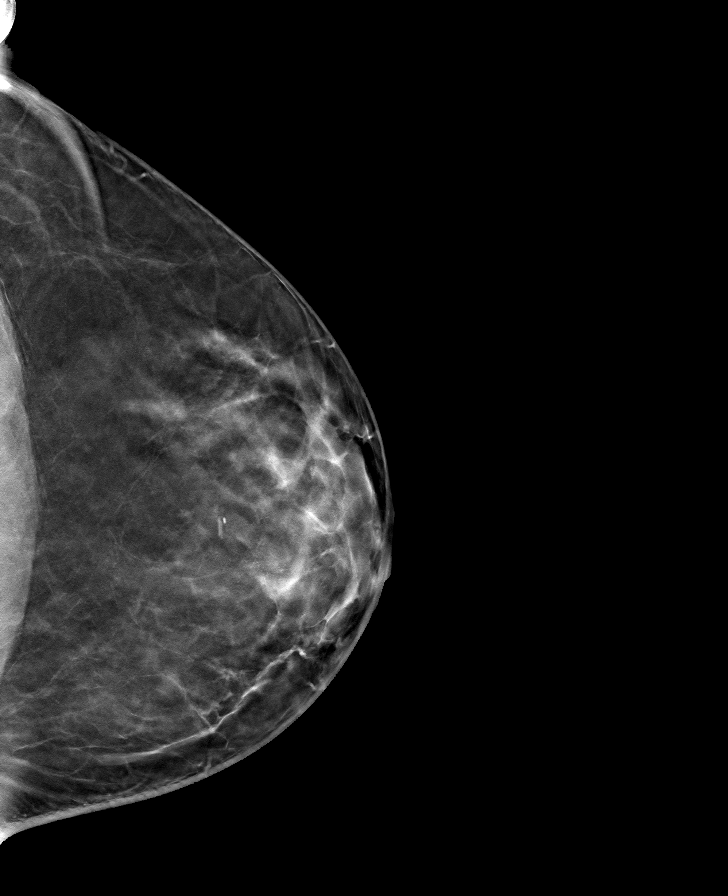

[8 of 24 positions shown; findings below may reference images not displayed]

ACR Breast Density Category b: There are scattered areas of
fibroglandular density.
FINDINGS: There are no findings suspicious for malignancy.
IMPRESSION: No mammographic evidence of malignancy. A result letter of this
screening mammogram will be mailed directly to the patient.

RECOMMENDATION:
Screening mammogram in one year. (Code:XG-X-X7B)

BI-RADS CATEGORY  1: Negative.

## 2022-08-03 ENCOUNTER — Ambulatory Visit: Payer: Federal, State, Local not specified - PPO | Admitting: Internal Medicine

## 2022-08-12 ENCOUNTER — Other Ambulatory Visit: Payer: Self-pay | Admitting: Internal Medicine

## 2022-08-12 DIAGNOSIS — E669 Obesity, unspecified: Secondary | ICD-10-CM

## 2022-08-22 ENCOUNTER — Other Ambulatory Visit: Payer: Self-pay | Admitting: Internal Medicine

## 2022-08-22 DIAGNOSIS — E782 Mixed hyperlipidemia: Secondary | ICD-10-CM

## 2022-09-04 ENCOUNTER — Other Ambulatory Visit: Payer: Self-pay | Admitting: Internal Medicine

## 2022-09-04 DIAGNOSIS — E669 Obesity, unspecified: Secondary | ICD-10-CM

## 2022-09-27 ENCOUNTER — Ambulatory Visit: Payer: Federal, State, Local not specified - PPO | Admitting: Family Medicine

## 2022-09-27 ENCOUNTER — Encounter: Payer: Self-pay | Admitting: Orthopaedic Surgery

## 2022-09-27 ENCOUNTER — Encounter: Payer: Self-pay | Admitting: Family Medicine

## 2022-09-27 ENCOUNTER — Ambulatory Visit (INDEPENDENT_AMBULATORY_CARE_PROVIDER_SITE_OTHER): Payer: Federal, State, Local not specified - PPO | Admitting: Orthopaedic Surgery

## 2022-09-27 VITALS — BP 122/84 | Ht 65.5 in | Wt 202.0 lb

## 2022-09-27 VITALS — BP 122/84 | HR 79 | Ht 65.5 in | Wt 202.1 lb

## 2022-09-27 DIAGNOSIS — G5601 Carpal tunnel syndrome, right upper limb: Secondary | ICD-10-CM | POA: Diagnosis not present

## 2022-09-27 DIAGNOSIS — E1169 Type 2 diabetes mellitus with other specified complication: Secondary | ICD-10-CM

## 2022-09-27 DIAGNOSIS — R202 Paresthesia of skin: Secondary | ICD-10-CM

## 2022-09-27 DIAGNOSIS — R200 Anesthesia of skin: Secondary | ICD-10-CM | POA: Diagnosis not present

## 2022-09-27 DIAGNOSIS — K1379 Other lesions of oral mucosa: Secondary | ICD-10-CM

## 2022-09-27 DIAGNOSIS — M79641 Pain in right hand: Secondary | ICD-10-CM

## 2022-09-27 MED ORDER — HYDROCODONE-ACETAMINOPHEN 5-325 MG PO TABS: ORAL_TABLET | ORAL | 0 refills | Status: DC

## 2022-09-27 MED ORDER — CHLORHEXIDINE GLUCONATE 0.12 % MT SOLN: OROMUCOSAL | 0 refills | Status: DC

## 2022-09-27 NOTE — Progress Notes (Signed)
Acute Office Visit  Subjective:    Patient ID: Teresa Russell, female    DOB: 12-15-1970, 52 y.o.   MRN: [id number]  Chief Complaint  Patient presents with   Hand Pain    Pt reports right hand numbness keeping her up at night since 08/28/22.    Medication Refill    Pt requesting a refill on mouth wash.     HPI Patient is in today for with reports of paresthesia in the right hand for a month.  History of carpal tunnel syndrome in the right hand at the age of 55.  She reports wearing her nocturnal splints but with minimal relief as of recently.  She notes that her numbness and tingling in her right hand is continuous and occurs frequently at nighttime.  Reports mild swelling in the right hand upon awakening. No recent injury or trauma reported.  She works at the [employer] and reports frequent lifting and use of her right hand. Range of motion and sensation is intact.  No neurological defects reported.  Past Medical History:  Diagnosis Date   Arthritis    wrists and right hip, right knee   Diabetes mellitus without complication (HCC)    GERD (gastroesophageal reflux disease)    Hyperlipidemia    Hypertension    Pneumonia    Sleep apnea     Past Surgical History:  Procedure Laterality Date   ABDOMINAL HYSTERECTOMY  2010   for heavy bleeding   BIOPSY  09/21/2021   Procedure: BIOPSY;  Surgeon: Lanelle Bal, DO;  [facility];  Service: Endoscopy;;   COLONOSCOPY N/A 10/14/2014   Procedure: COLONOSCOPY;  Surgeon: West Bali, MD;  [facility];  Service: Endoscopy;  Laterality: N/A;  115 - moved to 11:15 - office notified pt   COLONOSCOPY WITH PROPOFOL N/A 09/21/2021   Procedure: COLONOSCOPY WITH PROPOFOL;  Surgeon: Lanelle Bal, DO;  [facility];  Service: Endoscopy;  Laterality: N/A;  9:30 / ASA 2   POLYPECTOMY  09/21/2021   Procedure: POLYPECTOMY;  Surgeon: Lanelle Bal, DO;  [facility];  Service: Endoscopy;;     Family History  Problem Relation Age of Onset   Colon cancer Mother 10       passed away age 46   Breast cancer Maternal Aunt        currently treating    Social History   Socioeconomic History   Marital status: Single    Spouse name: Not on file   Number of children: Not on file   Years of education: Not on file   Highest education level: Not on file  Occupational History   Not on file  Tobacco Use   Smoking status: Former    Types: Cigarettes    Quit date: 05/02/2012    Years since quitting: 10.4   Smokeless tobacco: Never  Vaping Use   Vaping Use: Never used  Substance and Sexual Activity   Alcohol use: Not Currently    Comment: Last drink 2012; used to drink a case of beer every Friday   Drug use: No   Sexual activity: Yes  Other Topics Concern   Not on file  Social History Narrative   Not on file   Social Determinants of Health   Financial Resource Strain: Not on file  Food Insecurity: Not on file  Transportation Needs: Not on file  Physical Activity: Not on file  Stress: Not on file  Social Connections: Not on file  Intimate Partner Violence: Not on file    Outpatient Medications Prior to Visit  Medication Sig Dispense Refill   B Complex-C (VITAMIN B + C COMPLEX) TABS Take by mouth daily at 6 (six) AM. Plus Zinc     benzonatate (TESSALON) 100 MG capsule Take 1 capsule (100 mg total) by mouth 2 (two) times daily as needed for cough. 20 capsule 0   calcium-vitamin D (OSCAL WITH D) 250-125 MG-UNIT tablet Take 1 tablet by mouth daily.     clindamycin (CLEOCIN T) 1 % lotion Apply topically.     doxycycline (VIBRAMYCIN) 100 MG capsule Take 100 mg by mouth 2 (two) times daily.     fluticasone (FLONASE) 50 MCG/ACT nasal spray Place 2 sprays into both nostrils daily. 16 g 5   ibuprofen (ADVIL,MOTRIN) 200 MG tablet Take 400 mg by mouth daily.      ipratropium (ATROVENT) 0.03 % nasal spray Place 2 sprays into both nostrils every 12 (twelve) hours. 30 mL 0    lisinopril-hydrochlorothiazide (ZESTORETIC) 10-12.5 MG tablet Take 1 tablet by mouth daily. 90 tablet 3   LYSINE ACETATE PO Take by mouth daily at 6 (six) AM.     metFORMIN (GLUCOPHAGE-XR) 500 MG 24 hr tablet Take 1 tablet by mouth once daily with breakfast 90 tablet 0   omeprazole (PRILOSEC) 20 MG capsule Take 1 capsule (20 mg total) by mouth daily as needed. 30 capsule 3   rosuvastatin (CRESTOR) 10 MG tablet Take 1 tablet by mouth once daily 90 tablet 0   sertraline (ZOLOFT) 25 MG tablet Take 1 tablet (25 mg total) by mouth daily. 30 tablet 3   Simethicone (PHAZYME PO) Take 1 capsule by mouth as needed.     UNABLE TO FIND daily at 6 (six) AM. Med Name: Burn 60  Takes 2 tablets daily.     VITAMIN D PO Take by mouth daily at 6 (six) AM.     WEGOVY 2.4 MG/0.75ML SOAJ INJECT 2.4 MG  SUBCUTANEOUSLY ONCE A WEEK 4 mL 0   chlorhexidine (PERIDEX) 0.12 % solution Mix 1 capful with water to soak dentures overnight 473 mL 0   methylPREDNISolone (MEDROL DOSEPAK) 4 MG TBPK tablet Take as package instructions. 1 each 0   No facility-administered medications prior to visit.    Allergies  Allergen Reactions   Wellbutrin [Bupropion] Other (See Comments)    Spinal pain    Review of Systems  Constitutional:  Negative for chills and fever.  Eyes:  Negative for visual disturbance.  Respiratory:  Negative for chest tightness and shortness of breath.   Musculoskeletal:        Right hand pain  Neurological:  Negative for dizziness and headaches.       Objective:    Physical Exam HENT:     Head: Normocephalic.     Mouth/Throat:     Mouth: Mucous membranes are moist.  Cardiovascular:     Rate and Rhythm: Normal rate.     Heart sounds: Normal heart sounds.  Pulmonary:     Effort: Pulmonary effort is normal.     Breath sounds: Normal breath sounds.  Musculoskeletal:     Right hand: No swelling, deformity, tenderness or bony tenderness. Normal range of motion.     Comments: Positive tinel and   phalen's test  Neurological:     Mental Status: She is alert.     BP 122/84   Pulse 79   Ht 5' 5.5" (1.664 m)  Wt 202 lb 1.9 oz (91.7 kg)   SpO2 96%   BMI 33.12 kg/m  Wt Readings from Last 3 Encounters:  09/27/22 202 lb 1.9 oz (91.7 kg)  05/17/22 189 lb (85.7 kg)  03/30/22 189 lb (85.7 kg)       Assessment & Plan:  Paresthesia of hand Assessment & Plan: Encouraged to continue using nocturnal splint May use continually during the day Encouraged use of over-the-counter analgesic for pain relief Encouraged use of ice pack to relieve swelling Referral placed to orthopedics for possible steroid injection due to patient's unremitting symptoms   Orders: -     BMP8+EGFR -     Vitamin B12 -     VITAMIN D 25 Hydroxy (Vit-D Deficiency, Fractures) -     Ambulatory referral to Orthopedic Surgery  Dental sore -     Chlorhexidine Gluconate; Mix 1 capful with water to soak dentures overnight  Dispense: 473 mL; Refill: 0  Type 2 diabetes mellitus with other specified complication, without long-term current use of insulin (HCC) -     Microalbumin / creatinine urine ratio -     BMP8+eGFR   Note: This chart has been completed using [employer] software, and while attempts have been made to ensure accuracy, certain words and phrases may not be transcribed as intended.    Gilmore Laroche, FNP

## 2022-09-27 NOTE — Patient Instructions (Addendum)
I appreciate the opportunity to provide care to you today!    Follow up:  2 week with PCP  Labs: please stop by the lab today to get your blood drawn (BMP,  Vit B12 and Vit D)  Continue wearing nocturnal wrist splinting in the neutral position Take motrin as needed for pain relief  I recommend avoiding activities that elicit the symptoms  Referral: orthopedic surgery    Please continue to a heart-healthy diet and increase your physical activities. Try to exercise for at least five days a week.      It was a pleasure to see you and I look forward to continuing to work together on your health and well-being. Please do not hesitate to call the office if you need care or have questions about your care.   Have a wonderful day and week. With Gratitude, Gilmore Laroche MSN, FNP-BC

## 2022-09-27 NOTE — Patient Instructions (Signed)
Dr Alvester Morin office will contact from [phone] Address: [address]  [place]

## 2022-09-27 NOTE — Assessment & Plan Note (Signed)
Encouraged to continue using nocturnal splint May use continually during the day Encouraged use of over-the-counter analgesic for pain relief Encouraged use of ice pack to relieve swelling Referral placed to orthopedics for possible steroid injection due to patient's unremitting symptoms

## 2022-09-27 NOTE — Progress Notes (Signed)
Subjective:    Patient ID: Teresa Russell, female    DOB: 02-22-1971, 52 y.o.   MRN: [id number]  HPI She has nocturnal numbness of the right hand getting worse over the last month.  She has tried splints, medicine with no help.  She saw her family doctor today and was referred here.  She was told in the past she has carpal tunnel syndrome.  She decided to see how it did.  She has no trauma.  She has numbness in the median nerve distribution.  I have reviewed her notes from earlier today.   Review of Systems  Constitutional:  Positive for activity change.  Musculoskeletal:  Positive for arthralgias and myalgias.  All other systems reviewed and are negative. For Review of Systems, all other systems reviewed and are negative.  The following is a summary of the past history medically, past history surgically, known current medicines, social history and family history.  This information is gathered electronically by the computer from prior information and documentation.  I review this each visit and have found including this information at this point in the chart is beneficial and informative.   Past Medical History:  Diagnosis Date   Arthritis    wrists and right hip, right knee   Diabetes mellitus without complication (HCC)    GERD (gastroesophageal reflux disease)    Hyperlipidemia    Hypertension    Pneumonia    Sleep apnea     Past Surgical History:  Procedure Laterality Date   ABDOMINAL HYSTERECTOMY  2010   for heavy bleeding   BIOPSY  09/21/2021   Procedure: BIOPSY;  Surgeon: Lanelle Bal, DO;  Location: [facility];  Service: Endoscopy;;   COLONOSCOPY N/A 10/14/2014   Procedure: COLONOSCOPY;  Surgeon: West Bali, MD;  Location: [facility];  Service: Endoscopy;  Laterality: N/A;  115 - moved to 11:15 - office notified pt   COLONOSCOPY WITH PROPOFOL N/A 09/21/2021   Procedure: COLONOSCOPY WITH PROPOFOL;  Surgeon: Lanelle Bal, DO;  Location: [facility];   Service: Endoscopy;  Laterality: N/A;  9:30 / ASA 2   POLYPECTOMY  09/21/2021   Procedure: POLYPECTOMY;  Surgeon: Lanelle Bal, DO;  Location: [facility];  Service: Endoscopy;;    Current Outpatient Medications on File Prior to Visit  Medication Sig Dispense Refill   B Complex-C (VITAMIN B + C COMPLEX) TABS Take by mouth daily at 6 (six) AM. Plus Zinc     benzonatate (TESSALON) 100 MG capsule Take 1 capsule (100 mg total) by mouth 2 (two) times daily as needed for cough. 20 capsule 0   calcium-vitamin D (OSCAL WITH D) 250-125 MG-UNIT tablet Take 1 tablet by mouth daily.     chlorhexidine (PERIDEX) 0.12 % solution Mix 1 capful with water to soak dentures overnight 473 mL 0   clindamycin (CLEOCIN T) 1 % lotion Apply topically.     doxycycline (VIBRAMYCIN) 100 MG capsule Take 100 mg by mouth 2 (two) times daily.     fluticasone (FLONASE) 50 MCG/ACT nasal spray Place 2 sprays into both nostrils daily. 16 g 5   ibuprofen (ADVIL,MOTRIN) 200 MG tablet Take 400 mg by mouth daily.      ipratropium (ATROVENT) 0.03 % nasal spray Place 2 sprays into both nostrils every 12 (twelve) hours. 30 mL 0   lisinopril-hydrochlorothiazide (ZESTORETIC) 10-12.5 MG tablet Take 1 tablet by mouth daily. 90 tablet 3   LYSINE ACETATE PO Take by mouth daily  at 6 (six) AM.     metFORMIN (GLUCOPHAGE-XR) 500 MG 24 hr tablet Take 1 tablet by mouth once daily with breakfast 90 tablet 0   omeprazole (PRILOSEC) 20 MG capsule Take 1 capsule (20 mg total) by mouth daily as needed. 30 capsule 3   rosuvastatin (CRESTOR) 10 MG tablet Take 1 tablet by mouth once daily 90 tablet 0   sertraline (ZOLOFT) 25 MG tablet Take 1 tablet (25 mg total) by mouth daily. 30 tablet 3   Simethicone (PHAZYME PO) Take 1 capsule by mouth as needed.     UNABLE TO FIND daily at 6 (six) AM. Med Name: Burn 60  Takes 2 tablets daily.     VITAMIN D PO Take by mouth daily at 6 (six) AM.     WEGOVY 2.4 MG/0.75ML SOAJ INJECT 2.4 MG  SUBCUTANEOUSLY ONCE A  WEEK 4 mL 0   No current facility-administered medications on file prior to visit.    Social History   Socioeconomic History   Marital status: Single    Spouse name: Not on file   Number of children: Not on file   Years of education: Not on file   Highest education level: Not on file  Occupational History   Not on file  Tobacco Use   Smoking status: Former    Types: Cigarettes    Quit date: 05/02/2012    Years since quitting: 10.4   Smokeless tobacco: Never  Vaping Use   Vaping Use: Never used  Substance and Sexual Activity   Alcohol use: Not Currently    Comment: Last drink 2012; used to drink a case of beer every Friday   Drug use: No   Sexual activity: Yes  Other Topics Concern   Not on file  Social History Narrative   Not on file   Social Determinants of Health   Financial Resource Strain: Not on file  Food Insecurity: Not on file  Transportation Needs: Not on file  Physical Activity: Not on file  Stress: Not on file  Social Connections: Not on file  Intimate Partner Violence: Not on file    Family History  Problem Relation Age of Onset   Colon cancer Mother 4       passed away age 4   Breast cancer Maternal Aunt        currently treating    BP 122/84   Ht 5' 5.5" (1.664 m)   Wt 202 lb (91.6 kg)   BMI 33.10 kg/m   Body mass index is 33.1 kg/m.      Objective:   Physical Exam Vitals and nursing note reviewed. Exam conducted with a chaperone present.  Constitutional:      Appearance: She is well-developed.  HENT:     Head: Normocephalic and atraumatic.  Eyes:     Conjunctiva/sclera: Conjunctivae normal.     Pupils: Pupils are equal, round, and reactive to light.  Cardiovascular:     Rate and Rhythm: Normal rate and regular rhythm.  Pulmonary:     Effort: Pulmonary effort is normal.  Abdominal:     Palpations: Abdomen is soft.  Musculoskeletal:       Hands:     Cervical back: Normal range of motion and neck supple.  Skin:     General: Skin is warm and dry.  Neurological:     Mental Status: She is alert and oriented to person, place, and time.     Cranial Nerves: No cranial nerve deficit.  Motor: No abnormal muscle tone.     Coordination: Coordination normal.     Deep Tendon Reflexes: Reflexes are normal and symmetric. Reflexes normal.  Psychiatric:        Behavior: Behavior normal.        Thought Content: Thought content normal.        Judgment: Judgment normal.           Assessment & Plan:   Encounter Diagnoses  Name Primary?   Carpal tunnel syndrome, right upper limb Yes   Pain in right hand    Numbness and tingling in right hand    I will get EMGs.  Return after these are done.  I will give pain medicine.  I have reviewed the [place] Controlled Substance Reporting System web site prior to prescribing narcotic medicine for this patient.  Return in two weeks.  Call if any problem.  Precautions discussed.  Electronically Signed Darreld Mclean, MD 5/28/20243:05 PM

## 2022-09-28 LAB — BMP8+EGFR
BUN/Creatinine Ratio: 23 (ref 9–23)
BUN: 12 mg/dL (ref 6–24)
CO2: 17 mmol/L — ABNORMAL LOW (ref 20–29)
Calcium: 9.1 mg/dL (ref 8.7–10.2)
Chloride: 101 mmol/L (ref 96–106)
Creatinine, Ser: 0.53 mg/dL — ABNORMAL LOW (ref 0.57–1.00)
Glucose: 99 mg/dL (ref 70–99)
Potassium: 4.1 mmol/L (ref 3.5–5.2)
Sodium: 138 mmol/L (ref 134–144)
eGFR: 113 mL/min/{1.73_m2} (ref >59)

## 2022-09-29 ENCOUNTER — Other Ambulatory Visit: Payer: Self-pay | Admitting: Internal Medicine

## 2022-09-29 DIAGNOSIS — E669 Obesity, unspecified: Secondary | ICD-10-CM

## 2022-09-29 LAB — MICROALBUMIN / CREATININE URINE RATIO
Creatinine, Urine: 158.7 mg/dL
Microalb/Creat Ratio: 8 mg/g creat (ref 0–29)
Microalbumin, Urine: 13.2 ug/mL

## 2022-09-29 LAB — VITAMIN D 25 HYDROXY (VIT D DEFICIENCY, FRACTURES)

## 2022-09-30 ENCOUNTER — Encounter: Payer: Self-pay | Admitting: Physical Medicine and Rehabilitation

## 2022-09-30 ENCOUNTER — Ambulatory Visit (INDEPENDENT_AMBULATORY_CARE_PROVIDER_SITE_OTHER): Payer: Federal, State, Local not specified - PPO | Admitting: Physical Medicine and Rehabilitation

## 2022-09-30 DIAGNOSIS — M25521 Pain in right elbow: Secondary | ICD-10-CM | POA: Diagnosis not present

## 2022-09-30 DIAGNOSIS — M7989 Other specified soft tissue disorders: Secondary | ICD-10-CM

## 2022-09-30 DIAGNOSIS — R202 Paresthesia of skin: Secondary | ICD-10-CM

## 2022-09-30 DIAGNOSIS — M79641 Pain in right hand: Secondary | ICD-10-CM

## 2022-09-30 NOTE — Progress Notes (Unsigned)
Right hand numbness with pain since 52 years old. Worse the last 3 weeks. Tried splinting and advil- hasn't helped last 3 weeks

## 2022-10-01 ENCOUNTER — Other Ambulatory Visit: Payer: Self-pay | Admitting: Internal Medicine

## 2022-10-01 DIAGNOSIS — F331 Major depressive disorder, recurrent, moderate: Secondary | ICD-10-CM

## 2022-10-03 ENCOUNTER — Other Ambulatory Visit: Payer: Self-pay

## 2022-10-03 ENCOUNTER — Encounter: Payer: Self-pay | Admitting: Internal Medicine

## 2022-10-03 DIAGNOSIS — F331 Major depressive disorder, recurrent, moderate: Secondary | ICD-10-CM

## 2022-10-03 MED ORDER — SERTRALINE HCL 25 MG PO TABS
25.0000 mg | ORAL_TABLET | Freq: Every day | ORAL | 3 refills | Status: DC
Start: 2022-10-03 — End: 2022-10-12

## 2022-10-03 NOTE — Procedures (Signed)
EMG & NCV Findings: Evaluation of the right median motor nerve showed prolonged distal onset latency (4.5 ms) and decreased conduction velocity (Elbow-Wrist, 48 m/s).  The right median (across palm) sensory nerve showed no response (Palm) and prolonged distal peak latency (5.1 ms).  All remaining nerves (as indicated in the following tables) were within normal limits.    All examined muscles (as indicated in the following table) showed no evidence of electrical instability.    Impression: The above electrodiagnostic study is ABNORMAL and reveals evidence of a moderate right median nerve entrapment at the wrist (carpal tunnel syndrome) affecting sensory and motor components.   There is no significant electrodiagnostic evidence of any other focal nerve entrapment, brachial plexopathy or cervical radiculopathy.  As you know, this particular electrodiagnostic study cannot rule out chemical radiculitis or sensory only radiculopathy.  Recommendations: 1.  Follow-up with referring physician. 2.  Continue current management of symptoms. 3.  Continue use of resting splint at night-time and as needed during the day. 4.  Suggest surgical evaluation.  ___________________________ Naaman Plummer FAAPMR Board Certified, American Board of Physical Medicine and Rehabilitation    Nerve Conduction Studies Anti Sensory Summary Table   Stim Site NR Peak (ms) Norm Peak (ms) P-T Amp (V) Norm P-T Amp Site1 Site2 Delta-P (ms) Dist (cm) Vel (m/s) Norm Vel (m/s)  Right Median Acr Palm Anti Sensory (2nd Digit)  31.8C  Wrist    *5.1 <3.6 18.1 >10 Wrist Palm  0.0    Palm *NR  <2.0          Right Radial Anti Sensory (Base 1st Digit)  31.9C  Wrist    2.0 <3.1 41.2  Wrist Base 1st Digit 2.0 0.0    Right Ulnar Anti Sensory (5th Digit)  32.1C  Wrist    3.2 <3.7 37.7 >15.0 Wrist 5th Digit 3.2 14.0 44 >38   Motor Summary Table   Stim Site NR Onset (ms) Norm Onset (ms) O-P Amp (mV) Norm O-P Amp Site1 Site2 Delta-0  (ms) Dist (cm) Vel (m/s) Norm Vel (m/s)  Right Median Motor (Abd Poll Brev)  32.4C  Wrist    *4.5 <4.2 7.5 >5 Elbow Wrist 4.0 19.0 *48 >50  Elbow    8.5  5.7         Right Ulnar Motor (Abd Dig Min)  32.5C  Wrist    2.7 <4.2 9.7 >3 B Elbow Wrist 3.1 20.0 65 >53  B Elbow    5.8  9.6  A Elbow B Elbow 1.3 10.0 77 >53  A Elbow    7.1  8.2          EMG   Side Muscle Nerve Root Ins Act Fibs Psw Amp Dur Poly Recrt Int Dennie Bible Comment  Right Abd Poll Brev Median C8-T1 Nml Nml Nml Nml Nml 0 Nml Nml   Right 1stDorInt Ulnar C8-T1 Nml Nml Nml Nml Nml 0 Nml Nml   Right PronatorTeres Median C6-7 Nml Nml Nml Nml Nml 0 Nml Nml   Right Biceps Musculocut C5-6 Nml Nml Nml Nml Nml 0 Nml Nml   Right Deltoid Axillary C5-6 Nml Nml Nml Nml Nml 0 Nml Nml     Nerve Conduction Studies Anti Sensory Left/Right Comparison   Stim Site L Lat (ms) R Lat (ms) L-R Lat (ms) L Amp (V) R Amp (V) L-R Amp (%) Site1 Site2 L Vel (m/s) R Vel (m/s) L-R Vel (m/s)  Median Acr Palm Anti Sensory (2nd Digit)  31.8C  Wrist  *5.1  18.1  Wrist Palm     Palm             Radial Anti Sensory (Base 1st Digit)  31.9C  Wrist  2.0   41.2  Wrist Base 1st Digit     Ulnar Anti Sensory (5th Digit)  32.1C  Wrist  3.2   37.7  Wrist 5th Digit  44    Motor Left/Right Comparison   Stim Site L Lat (ms) R Lat (ms) L-R Lat (ms) L Amp (mV) R Amp (mV) L-R Amp (%) Site1 Site2 L Vel (m/s) R Vel (m/s) L-R Vel (m/s)  Median Motor (Abd Poll Brev)  32.4C  Wrist  *4.5   7.5  Elbow Wrist  *48   Elbow  8.5   5.7        Ulnar Motor (Abd Dig Min)  32.5C  Wrist  2.7   9.7  B Elbow Wrist  65   B Elbow  5.8   9.6  A Elbow B Elbow  77   A Elbow  7.1   8.2           Waveforms:

## 2022-10-03 NOTE — Progress Notes (Signed)
[name] - 52 y.o. female MRN [id number]  Date of birth: [birth year]  Office Visit Note: Visit Date: 09/30/2022 PCP: Anabel Halon, MD Referred by: Darreld Mclean, MD  Subjective: Chief Complaint  Patient presents with   Right Hand - Numbness   HPI:  BELISA KASSAM is a 52 y.o. female who comes in today at the request of Dr. Darreld Mclean for evaluation and management of chronic, worsening and severe pain, numbness and tingling in the Right upper extremities.  Patient is Right hand dominant.  She reports several months now of severe worsening right hand pain with paresthesia numbness and tingling in global fashion.  She cannot say really if it is more 1 side of the other dorsal or palm versus medial or lateral.  This does seem to occur at night and she does have a positive flick sign.  She has been wearing braces for quite a while and reports being diagnosed with carpal tunnel syndrome at the age of 70 or 74.  She has had no prior carpal tunnel release or electrodiagnostic study.  She does endorse some elbow pain as well as occasional neck pain.  She is getting very mild symptoms on the left.  She feels weak at times with opening objects.  She has tried and failed the splinting as well as medication management with pain medication and anti-inflammatory.  Her case is complicated by type 2 diabetes non-insulin-dependent.  Hemoglobin A1c's over the last year have been pretty good around 6 but she did have as high as 7.5 a year ago.  No diagnosis of neuropathy.   I spent more than 30 minutes speaking face-to-face with the patient with 50% of the time in counseling and discussing coordination of care.    Review of Systems  Musculoskeletal:  Positive for joint pain and neck pain.  Neurological:  Positive for tingling and weakness.  All other systems reviewed and are negative.  Otherwise per HPI.  Assessment & Plan: Visit Diagnoses:    ICD-10-CM   1. Paresthesia of skin  R20.2 NCV  with EMG (electromyography)    2. Pain in right hand  M79.641     3. Pain in right elbow  M25.521     4. Hand swelling  M79.89       Plan: Impression: Pain and numbness and swelling in the right hand chronic in nature more recent worsening.  Complicated by history of diabetes but fairly well-controlled with no history of neuropathy.  Symptoms do sound like potentially carpal tunnel syndrome mixed with osteoarthritic changes or tendinitis.  Electrodiagnostic study performed.  The above electrodiagnostic study is ABNORMAL and reveals evidence of a moderate right median nerve entrapment at the wrist (carpal tunnel syndrome) affecting sensory and motor components.   There is no significant electrodiagnostic evidence of any other focal nerve entrapment, brachial plexopathy or cervical radiculopathy.  As you know, this particular electrodiagnostic study cannot rule out chemical radiculitis or sensory only radiculopathy.  Recommendations: 1.  Follow-up with referring physician. 2.  Continue current management of symptoms. 3.  Continue use of resting splint at night-time and as needed during the day. 4.  Suggest surgical evaluation.  Meds & Orders: No orders of the defined types were placed in this encounter.   Orders Placed This Encounter  Procedures   NCV with EMG (electromyography)    Follow-up: Return for Darreld Mclean, MD.   Procedures: No procedures performed  EMG & NCV Findings: Evaluation of the right median motor  nerve showed prolonged distal onset latency (4.5 ms) and decreased conduction velocity (Elbow-Wrist, 48 m/s).  The right median (across palm) sensory nerve showed no response (Palm) and prolonged distal peak latency (5.1 ms).  All remaining nerves (as indicated in the following tables) were within normal limits.    All examined muscles (as indicated in the following table) showed no evidence of electrical instability.    Impression: The above electrodiagnostic study is  ABNORMAL and reveals evidence of a moderate right median nerve entrapment at the wrist (carpal tunnel syndrome) affecting sensory and motor components.   There is no significant electrodiagnostic evidence of any other focal nerve entrapment, brachial plexopathy or cervical radiculopathy.  As you know, this particular electrodiagnostic study cannot rule out chemical radiculitis or sensory only radiculopathy.  Recommendations: 1.  Follow-up with referring physician. 2.  Continue current management of symptoms. 3.  Continue use of resting splint at night-time and as needed during the day. 4.  Suggest surgical evaluation.  ___________________________ Naaman Plummer FAAPMR Board Certified, American Board of Physical Medicine and Rehabilitation    Nerve Conduction Studies Anti Sensory Summary Table   Stim Site NR Peak (ms) Norm Peak (ms) P-T Amp (V) Norm P-T Amp Site1 Site2 Delta-P (ms) Dist (cm) Vel (m/s) Norm Vel (m/s)  Right Median Acr Palm Anti Sensory (2nd Digit)  31.8C  Wrist    *5.1 <3.6 18.1 >10 Wrist Palm  0.0    Palm *NR  <2.0          Right Radial Anti Sensory (Base 1st Digit)  31.9C  Wrist    2.0 <3.1 41.2  Wrist Base 1st Digit 2.0 0.0    Right Ulnar Anti Sensory (5th Digit)  32.1C  Wrist    3.2 <3.7 37.7 >15.0 Wrist 5th Digit 3.2 14.0 44 >38   Motor Summary Table   Stim Site NR Onset (ms) Norm Onset (ms) O-P Amp (mV) Norm O-P Amp Site1 Site2 Delta-0 (ms) Dist (cm) Vel (m/s) Norm Vel (m/s)  Right Median Motor (Abd Poll Brev)  32.4C  Wrist    *4.5 <4.2 7.5 >5 Elbow Wrist 4.0 19.0 *48 >50  Elbow    8.5  5.7         Right Ulnar Motor (Abd Dig Min)  32.5C  Wrist    2.7 <4.2 9.7 >3 B Elbow Wrist 3.1 20.0 65 >53  B Elbow    5.8  9.6  A Elbow B Elbow 1.3 10.0 77 >53  A Elbow    7.1  8.2          EMG   Side Muscle Nerve Root Ins Act Fibs Psw Amp Dur Poly Recrt Int Dennie Bible Comment  Right Abd Poll Brev Median C8-T1 Nml Nml Nml Nml Nml 0 Nml Nml   Right 1stDorInt Ulnar C8-T1 Nml  Nml Nml Nml Nml 0 Nml Nml   Right PronatorTeres Median C6-7 Nml Nml Nml Nml Nml 0 Nml Nml   Right Biceps Musculocut C5-6 Nml Nml Nml Nml Nml 0 Nml Nml   Right Deltoid Axillary C5-6 Nml Nml Nml Nml Nml 0 Nml Nml     Nerve Conduction Studies Anti Sensory Left/Right Comparison   Stim Site L Lat (ms) R Lat (ms) L-R Lat (ms) L Amp (V) R Amp (V) L-R Amp (%) Site1 Site2 L Vel (m/s) R Vel (m/s) L-R Vel (m/s)  Median Acr Palm Anti Sensory (2nd Digit)  31.8C  Wrist  *5.1   18.1  Wrist Palm  Palm             Radial Anti Sensory (Base 1st Digit)  31.9C  Wrist  2.0   41.2  Wrist Base 1st Digit     Ulnar Anti Sensory (5th Digit)  32.1C  Wrist  3.2   37.7  Wrist 5th Digit  44    Motor Left/Right Comparison   Stim Site L Lat (ms) R Lat (ms) L-R Lat (ms) L Amp (mV) R Amp (mV) L-R Amp (%) Site1 Site2 L Vel (m/s) R Vel (m/s) L-R Vel (m/s)  Median Motor (Abd Poll Brev)  32.4C  Wrist  *4.5   7.5  Elbow Wrist  *48   Elbow  8.5   5.7        Ulnar Motor (Abd Dig Min)  32.5C  Wrist  2.7   9.7  B Elbow Wrist  65   B Elbow  5.8   9.6  A Elbow B Elbow  77   A Elbow  7.1   8.2           Waveforms:             Clinical History: No specialty comments available.     Objective:  VS:  HT:    WT:   BMI:     BP:   HR: bpm  TEMP: ( )  RESP:  Physical Exam Vitals and nursing note reviewed.  Constitutional:      General: She is not in acute distress.    Appearance: Normal appearance. She is well-developed. She is not ill-appearing.  HENT:     Head: Normocephalic and atraumatic.  Eyes:     Conjunctiva/sclera: Conjunctivae normal.     Pupils: Pupils are equal, round, and reactive to light.  Cardiovascular:     Rate and Rhythm: Normal rate.     Pulses: Normal pulses.  Pulmonary:     Effort: Pulmonary effort is normal.  Musculoskeletal:        General: No swelling, tenderness or deformity.     Right lower leg: No edema.     Left lower leg: No edema.     Comments: Inspection  reveals no atrophy of the bilateral APB or FDI or hand intrinsics. There is no swelling, color changes, allodynia or dystrophic changes. There is 5 out of 5 strength in the bilateral wrist extension, finger abduction and long finger flexion. There is intact sensation to light touch in all dermatomal and peripheral nerve distributions. There is a negative Tinel's test at the bilateral wrist and elbow. There is a positive Phalen's test on the right. There is a negative Hoffmann's test bilaterally.  Skin:    General: Skin is warm and dry.     Findings: No erythema or rash.  Neurological:     General: No focal deficit present.     Mental Status: She is alert and oriented to person, place, and time.     Cranial Nerves: No cranial nerve deficit.     Sensory: No sensory deficit.     Motor: No weakness or abnormal muscle tone.     Coordination: Coordination normal.     Gait: Gait normal.  Psychiatric:        Mood and Affect: Mood normal.        Behavior: Behavior normal.      Imaging: No results found.

## 2022-10-12 ENCOUNTER — Ambulatory Visit: Payer: Federal, State, Local not specified - PPO | Admitting: Orthopaedic Surgery

## 2022-10-12 ENCOUNTER — Encounter: Payer: Self-pay | Admitting: Orthopaedic Surgery

## 2022-10-12 ENCOUNTER — Ambulatory Visit (HOSPITAL_COMMUNITY): Admission: RE | Admit: 2022-10-12 | Payer: Federal, State, Local not specified - PPO | Source: Ambulatory Visit

## 2022-10-12 ENCOUNTER — Encounter: Payer: Self-pay | Admitting: Internal Medicine

## 2022-10-12 ENCOUNTER — Ambulatory Visit: Payer: Federal, State, Local not specified - PPO | Admitting: Internal Medicine

## 2022-10-12 ENCOUNTER — Encounter (HOSPITAL_COMMUNITY): Payer: Self-pay

## 2022-10-12 VITALS — BP 133/93 | HR 79 | Ht 65.0 in | Wt 206.0 lb

## 2022-10-12 VITALS — BP 138/88 | HR 76 | Ht 65.0 in

## 2022-10-12 DIAGNOSIS — G5601 Carpal tunnel syndrome, right upper limb: Secondary | ICD-10-CM

## 2022-10-12 DIAGNOSIS — F331 Major depressive disorder, recurrent, moderate: Secondary | ICD-10-CM | POA: Diagnosis not present

## 2022-10-12 DIAGNOSIS — I10 Essential (primary) hypertension: Secondary | ICD-10-CM | POA: Diagnosis not present

## 2022-10-12 DIAGNOSIS — R1031 Right lower quadrant pain: Secondary | ICD-10-CM | POA: Diagnosis not present

## 2022-10-12 DIAGNOSIS — E1169 Type 2 diabetes mellitus with other specified complication: Secondary | ICD-10-CM | POA: Diagnosis not present

## 2022-10-12 LAB — POCT GLYCOSYLATED HEMOGLOBIN (HGB A1C): HbA1c, POC (controlled diabetic range): 5.7 % (ref 0.0–7.0)

## 2022-10-12 MED ORDER — ESCITALOPRAM OXALATE 5 MG PO TABS
5.0000 mg | ORAL_TABLET | Freq: Every day | ORAL | 3 refills | Status: DC
Start: 2022-10-12 — End: 2023-01-11

## 2022-10-12 NOTE — Assessment & Plan Note (Signed)
Followed by orthopedic surgeon Planned to get carpal tunnel release surgery

## 2022-10-12 NOTE — Progress Notes (Signed)
Ref dr Romeo Apple or Dr Jinny Blossom for carpal Tunnel right hand surgery

## 2022-10-12 NOTE — Assessment & Plan Note (Signed)
Flowsheet Row Office Visit from 10/12/2022 in [facility]  PHQ-9 Total Score 7      Uncontrolled Started Zoloft 25 mg QD, but has increased fatigue and drowsiness with it Switched to Lexapro 5 mg QD Advised to engage in activities of liking and perform simple exercises or relaxation techniques (yoga or tai chi)

## 2022-10-12 NOTE — Patient Instructions (Signed)
Please do not take Wegovy until getting CT abdomen results.  Please stop taking Zoloft and start taking Lexapro.  Please continue to take medications as prescribed.  Please continue to follow low carb diet and perform moderate exercise/walking at least 150 mins/week.

## 2022-10-12 NOTE — Assessment & Plan Note (Signed)
Lab Results  Component Value Date   HGBA1C 5.7 10/12/2022   Associated with HTN and HLD Well controlled On metformin 500 mg daily On Wegovy for weight loss, advised to hold it for now as she has severe abdominal pain until getting CT abdomen Advised to follow diabetic diet On statin and ACEi F/u CMP and lipid panel Diabetic eye exam: Advised to follow up with Ophthalmology for diabetic eye exam

## 2022-10-12 NOTE — Assessment & Plan Note (Addendum)
BP Readings from Last 1 Encounters:  10/12/22 138/88   Well-controlled with Lisinopril-HCTZ Counseled for compliance with the medications Advised DASH diet and moderate exercise/walking, at least 150 mins/week

## 2022-10-12 NOTE — Progress Notes (Signed)
My hand still goes numb.  She was seen by Dr. Alvester Morin for EMGs.  It showed: The above electrodiagnostic study is ABNORMAL and reveals evidence of a moderate right median nerve entrapment at the wrist (carpal tunnel syndrome) affecting sensory and motor components.   I have explained the findings to her.  I will have her see Dr. Dallas Schimke or Dr. Romeo Apple for possible surgery.  She agrees.  She has decreased sensation of the median nerve on the right with positive Phalens.  Encounter Diagnosis  Name Primary?   Carpal tunnel syndrome, right upper limb Yes   To see Dr. Salena Saner or Dr. Rexene Edison for surgery.  Call if any problem.  Precautions discussed.  Electronically Signed Darreld Mclean, MD 6/12/20248:24 AM

## 2022-10-12 NOTE — Progress Notes (Signed)
Established Patient Office Visit  Subjective:  Patient ID: Teresa Russell, female    DOB: 22-Jul-1970  Age: 52 y.o. MRN: [id number]  CC:  Chief Complaint  Patient presents with   Diabetes    Follow up. Patient is complaining of abdominal pain that started last night. Patient feels that she is extra tired and doesn't have energy she does feel like it is connected to taking the zoloft     HPI Teresa Russell is a 52 y.o. female with past medical history of HTN, DM, allergic rhinitis and obesity who presents for f/u of her chronic medical conditions.  She complains of severe right lower quadrant abdominal pain, sharp, 8-10/10, which is radiating to the periumbilical area since last night.  Denies any nausea, vomiting, fever or chills.  She has had about 2 bowel movements on a daily basis lately, but denies any watery diarrhea, melena or hematochezia.  Denies any dysuria or hematuria.  She has been feeling weak/tired due to it as well.  HTN: Her BP has been well-controlled at home.  She denies any chest pain, dyspnea or palpitations currently.  Type II DM: She is currently taking metformin 500 mg QD and Wegovy (which was started for weight loss before her diagnosis of type II DM).  She has not been able to lose weight despite taking Wegovy lately.  Denies any polyuria or polyphagia currently.  She also takes Crestor for HLD.  MDD: She has been taking Zoloft for it, and has noticed slight improvement in anxiety with it.  But she reports increased fatigue and drowsiness since taking Zoloft.  Denies any SI or HI currently.  She had orthopedic surgery evaluation for right hand carpal tunnel syndrome.  She is also using wrist brace for it, but still has numbness and weakness of the right hand.  Past Medical History:  Diagnosis Date   Arthritis    wrists and right hip, right knee   Diabetes mellitus without complication (HCC)    GERD (gastroesophageal reflux disease)    Hyperlipidemia     Hypertension    Pneumonia    Sleep apnea     Past Surgical History:  Procedure Laterality Date   ABDOMINAL HYSTERECTOMY  2010   for heavy bleeding   BIOPSY  09/21/2021   Procedure: BIOPSY;  Surgeon: Lanelle Bal, DO;  [facility]: [facility];  Service: Endoscopy;;   COLONOSCOPY N/A 10/14/2014   Procedure: COLONOSCOPY;  Surgeon: West Bali, MD;  [facility]: [facility];  Service: Endoscopy;  Laterality: N/A;  115 - moved to 11:15 - office notified pt   COLONOSCOPY WITH PROPOFOL N/A 09/21/2021   Procedure: COLONOSCOPY WITH PROPOFOL;  Surgeon: Lanelle Bal, DO;  [facility]: [facility];  Service: Endoscopy;  Laterality: N/A;  9:30 / ASA 2   POLYPECTOMY  09/21/2021   Procedure: POLYPECTOMY;  Surgeon: Lanelle Bal, DO;  [facility]: [facility];  Service: Endoscopy;;    Family History  Problem Relation Age of Onset   Colon cancer Mother 54       passed away age 83   Breast cancer Maternal Aunt        currently treating    Social History   Socioeconomic History   Marital status: Single    Spouse name: Not on file   Number of children: Not on file   Years of education: Not on file   Highest education level: Not on file  Occupational History   Not on  file  Tobacco Use   Smoking status: Former    Types: Cigarettes    Quit date: 05/02/2012    Years since quitting: 10.4   Smokeless tobacco: Never  Vaping Use   Vaping Use: Never used  Substance and Sexual Activity   Alcohol use: Not Currently    Comment: Last drink 2012; used to drink a case of beer every Friday   Drug use: No   Sexual activity: Yes  Other Topics Concern   Not on file  Social History Narrative   Not on file   Social Determinants of Health   Financial Resource Strain: Not on file  Food Insecurity: Not on file  Transportation Needs: Not on file  Physical Activity: Not on file  Stress: Not on file  Social Connections: Not on file  Intimate Partner Violence: Not on file    Outpatient  Medications Prior to Visit  Medication Sig Dispense Refill   B Complex-C (VITAMIN B + C COMPLEX) TABS Take by mouth daily at 6 (six) AM. Plus Zinc     benzonatate (TESSALON) 100 MG capsule Take 1 capsule (100 mg total) by mouth 2 (two) times daily as needed for cough. 20 capsule 0   calcium-vitamin D (OSCAL WITH D) 250-125 MG-UNIT tablet Take 1 tablet by mouth daily.     chlorhexidine (PERIDEX) 0.12 % solution Mix 1 capful with water to soak dentures overnight 473 mL 0   clindamycin (CLEOCIN T) 1 % lotion Apply topically.     doxycycline (VIBRAMYCIN) 100 MG capsule Take 100 mg by mouth 2 (two) times daily.     fluticasone (FLONASE) 50 MCG/ACT nasal spray Place 2 sprays into both nostrils daily. 16 g 5   HYDROcodone-acetaminophen (NORCO/VICODIN) 5-325 MG tablet One tablet every four hours as needed for acute pain.  Limit of five days per [place] statue. 30 tablet 0   ibuprofen (ADVIL,MOTRIN) 200 MG tablet Take 400 mg by mouth daily.      ipratropium (ATROVENT) 0.03 % nasal spray Place 2 sprays into both nostrils every 12 (twelve) hours. 30 mL 0   lisinopril-hydrochlorothiazide (ZESTORETIC) 10-12.5 MG tablet Take 1 tablet by mouth daily. 90 tablet 3   LYSINE ACETATE PO Take by mouth daily at 6 (six) AM.     metFORMIN (GLUCOPHAGE-XR) 500 MG 24 hr tablet Take 1 tablet by mouth once daily with breakfast 90 tablet 0   omeprazole (PRILOSEC) 20 MG capsule Take 1 capsule (20 mg total) by mouth daily as needed. 30 capsule 3   rosuvastatin (CRESTOR) 10 MG tablet Take 1 tablet by mouth once daily 90 tablet 0   Simethicone (PHAZYME PO) Take 1 capsule by mouth as needed.     UNABLE TO FIND daily at 6 (six) AM. Med Name: Burn 60  Takes 2 tablets daily.     VITAMIN D PO Take by mouth daily at 6 (six) AM.     WEGOVY 2.4 MG/0.75ML SOAJ INEJCT 2.4MG  INTO THE SKIN ONCE WEEKLY 4 mL 0   sertraline (ZOLOFT) 25 MG tablet Take 1 tablet (25 mg total) by mouth daily. 30 tablet 3   No facility-administered medications  prior to visit.    Allergies  Allergen Reactions   Wellbutrin [Bupropion] Other (See Comments)    Spinal pain    ROS Review of Systems  Constitutional:  Positive for fatigue. Negative for chills and fever.  HENT:  Negative for congestion, sinus pressure, sinus pain and sore throat.   Eyes:  Negative for pain  and discharge.  Respiratory:  Negative for cough and shortness of breath.   Cardiovascular:  Negative for chest pain and palpitations.  Gastrointestinal:  Positive for abdominal pain. Negative for diarrhea, nausea and vomiting.  Endocrine: Negative for polydipsia and polyuria.  Genitourinary:  Negative for dysuria, flank pain, hematuria, vaginal discharge and vaginal pain.  Musculoskeletal:  Negative for neck pain and neck stiffness.  Skin:  Positive for rash.       Cyst over upper back  Neurological:  Positive for numbness (Right hand). Negative for dizziness.  Psychiatric/Behavioral:  Positive for dysphoric mood. Negative for agitation and behavioral problems.       Objective:    Physical Exam Vitals reviewed.  Constitutional:      General: She is not in acute distress.    Appearance: She is not diaphoretic.  HENT:     Head: Normocephalic and atraumatic.     Nose: No congestion.     Comments: DNS    Mouth/Throat:     Mouth: Mucous membranes are dry.  Eyes:     General: No scleral icterus.    Extraocular Movements: Extraocular movements intact.  Cardiovascular:     Rate and Rhythm: Normal rate and regular rhythm.     Pulses: Normal pulses.     Heart sounds: Normal heart sounds. No murmur heard. Pulmonary:     Breath sounds: Normal breath sounds. No wheezing or rales.  Abdominal:     General: Bowel sounds are normal.     Palpations: Abdomen is soft.     Tenderness: There is abdominal tenderness (RLQ and periumbilical). There is guarding (MIld, RLQ). There is no rebound.     Hernia: A hernia (Umbilical) is present.  Musculoskeletal:     Cervical back: Neck  supple. No tenderness.     Right lower leg: No edema.     Left lower leg: No edema.  Skin:    General: Skin is warm.     Findings: Rash (Whitish patch over extensor surface of elbow region) present.     Comments: Epidermoid cyst over upper back, about 2 cm in diameter, no discharge or bleeding noted Hyperpigmentation in axillary and neck area -likely acanthosis nigricans  Neurological:     General: No focal deficit present.     Mental Status: She is alert and oriented to person, place, and time.     Sensory: No sensory deficit.     Motor: Weakness (Right hand - 4/5) present.  Psychiatric:        Mood and Affect: Mood normal.        Behavior: Behavior normal.     BP 138/88 (BP Location: Left Arm, Cuff Size: Normal)   Pulse 76   Ht 5\' 5"  (1.651 m)   SpO2 95%   BMI 34.28 kg/m  Wt Readings from Last 3 Encounters:  10/12/22 206 lb (93.4 kg)  09/27/22 202 lb (91.6 kg)  09/27/22 202 lb 1.9 oz (91.7 kg)    Lab Results  Component Value Date   TSH 1.070 03/30/2022   Lab Results  Component Value Date   WBC 5.1 03/30/2022   HGB 13.5 03/30/2022   HCT 38.2 03/30/2022   MCV 83 03/30/2022   PLT 320 03/30/2022   Lab Results  Component Value Date   NA 138 09/27/2022   K 4.1 09/27/2022   CO2 17 (L) 09/27/2022   GLUCOSE 99 09/27/2022   BUN 12 09/27/2022   CREATININE 0.53 (L) 09/27/2022   BILITOT 0.4 03/30/2022  ALKPHOS 76 03/30/2022   AST 54 (H) 03/30/2022   ALT 70 (H) 03/30/2022   PROT 7.1 03/30/2022   ALBUMIN 4.6 03/30/2022   CALCIUM 9.1 09/27/2022   ANIONGAP 5 09/20/2021   EGFR 113 09/27/2022   Lab Results  Component Value Date   CHOL 139 11/10/2021   Lab Results  Component Value Date   HDL 51 11/10/2021   Lab Results  Component Value Date   LDLCALC 68 11/10/2021   Lab Results  Component Value Date   TRIG 109 11/10/2021   Lab Results  Component Value Date   CHOLHDL 2.7 11/10/2021   Lab Results  Component Value Date   HGBA1C 5.7 10/12/2022       Assessment & Plan:   Problem List Items Addressed This Visit       Cardiovascular and Mediastinum   Hypertension    BP Readings from Last 1 Encounters:  10/12/22 138/88  Well-controlled with Lisinopril-HCTZ Counseled for compliance with the medications Advised DASH diet and moderate exercise/walking, at least 150 mins/week        Endocrine   DM (diabetes mellitus) (HCC)    Lab Results  Component Value Date   HGBA1C 5.7 10/12/2022  Associated with HTN and HLD Well controlled On metformin 500 mg daily On Wegovy for weight loss, advised to hold it for now as she has severe abdominal pain until getting CT abdomen Advised to follow diabetic diet On statin and ACEi F/u CMP and lipid panel Diabetic eye exam: Advised to follow up with Ophthalmology for diabetic eye exam      Relevant Orders   POCT glycosylated hemoglobin (Hb A1C) (Completed)     Nervous and Auditory   Carpal tunnel syndrome of right wrist    Followed by orthopedic surgeon Planned to get carpal tunnel release surgery      Relevant Medications   escitalopram (LEXAPRO) 5 MG tablet     Other   Moderate episode of recurrent major depressive disorder (HCC)    Flowsheet Row Office Visit from 10/12/2022 in [facility]  PHQ-9 Total Score 7     Uncontrolled Started Zoloft 25 mg QD, but has increased fatigue and drowsiness with it Switched to Lexapro 5 mg QD Advised to engage in activities of liking and perform simple exercises or relaxation techniques (yoga or tai chi)      Relevant Medications   escitalopram (LEXAPRO) 5 MG tablet   Right lower quadrant abdominal pain - Primary    Severe RLQ pain could be due to appendicitis vs colitis Check CT abdomen pelvis stat Advised to hold [facility] for now until getting CT abdomen      Relevant Orders   CT Abdomen Pelvis Wo Contrast    Meds ordered this encounter  Medications   escitalopram (LEXAPRO) 5 MG tablet    Sig: Take 1 tablet (5  mg total) by mouth daily.    Dispense:  30 tablet    Refill:  3    Follow-up: Return in about 3 months (around 01/12/2023) for DM and GAD.    Anabel Halon, MD

## 2022-10-12 NOTE — Assessment & Plan Note (Signed)
Severe RLQ pain could be due to appendicitis vs colitis Check CT abdomen pelvis stat Advised to hold [facility] for now until getting CT abdomen

## 2022-10-13 ENCOUNTER — Ambulatory Visit (HOSPITAL_BASED_OUTPATIENT_CLINIC_OR_DEPARTMENT_OTHER)
Admission: RE | Admit: 2022-10-13 | Discharge: 2022-10-13 | Disposition: A | Discharge status: Home / Self Care | Payer: Federal, State, Local not specified - PPO | Source: Ambulatory Visit | Attending: Internal Medicine | Admitting: Internal Medicine

## 2022-10-13 DIAGNOSIS — R1031 Right lower quadrant pain: Secondary | ICD-10-CM | POA: Diagnosis not present

## 2022-10-13 DIAGNOSIS — K5732 Diverticulitis of large intestine without perforation or abscess without bleeding: Secondary | ICD-10-CM | POA: Diagnosis not present

## 2022-10-24 ENCOUNTER — Encounter: Payer: Self-pay | Admitting: Internal Medicine

## 2022-10-24 ENCOUNTER — Telehealth: Payer: Self-pay | Admitting: Orthopaedic Surgery

## 2022-10-24 ENCOUNTER — Encounter: Payer: Self-pay | Admitting: Orthopedic Surgery

## 2022-10-24 ENCOUNTER — Ambulatory Visit: Payer: Federal, State, Local not specified - PPO | Admitting: Orthopedic Surgery

## 2022-10-24 ENCOUNTER — Other Ambulatory Visit: Payer: Self-pay | Admitting: Internal Medicine

## 2022-10-24 ENCOUNTER — Telehealth: Payer: Self-pay

## 2022-10-24 VITALS — BP 148/92 | HR 85 | Ht 65.0 in | Wt 199.0 lb

## 2022-10-24 DIAGNOSIS — E669 Obesity, unspecified: Secondary | ICD-10-CM

## 2022-10-24 DIAGNOSIS — G5601 Carpal tunnel syndrome, right upper limb: Secondary | ICD-10-CM

## 2022-10-24 DIAGNOSIS — Z01818 Encounter for other preprocedural examination: Secondary | ICD-10-CM | POA: Diagnosis not present

## 2022-10-24 MED ORDER — HYDROCODONE-ACETAMINOPHEN 5-325 MG PO TABS: ORAL_TABLET | ORAL | 0 refills | Status: DC

## 2022-10-24 MED ORDER — WEGOVY 2.4 MG/0.75ML ~~LOC~~ SOAJ
2.4000 mg | SUBCUTANEOUS | 0 refills | Status: DC
Start: 2022-10-24 — End: 2022-12-12

## 2022-10-24 NOTE — Patient Instructions (Addendum)
Out of work for 2 weeks after surgery   Your surgery will be at [facility] by Dr Romeo Apple  The hospital will contact you with a preoperative appointment to discuss Anesthesia.  Please arrive on time or 15 minutes early for the preoperative appointment, they have a very tight schedule if you are late o  r do not come in your surgery will be cancelled.  The phone number is [phone]. Please bring your medications with you for the appointment. They will tell you the arrival time and medication instructions when you have your preoperative evaluation. Do not wear nail polish the day of your surgery and if you take Phentermine you need to stop this medication ONE WEEK prior to your surgery. If you take Docia Barrier, Jardiance, or Steglatro) - Hold 72 hours before the procedure.  If you take Ozempic,  Wegovy, Bydureon or Trulicity do not take for 8 days before your surgery. If you take Victoza, Rybelsis, Saxenda or Adlyxi stop 24 hours before the procedure.  Please arrive at the hospital 2 hours before procedure if scheduled at 9:30 or later in the day or at the time the nurse tells you at your preoperative visit.   If you have my chart do not use the time given in my chart use the time given to you by the nurse during your preoperative visit.   Your surgery  time may change. Please be available for phone calls the day of your surgery and the day before. The Short Stay department may need to discuss changes about your surgery time. Not reaching the you could lead to procedure delays and possible cancellation.  You must have a ride home and someone to stay with you for 24 to 48 hours. The person taking you home will receive and sign for the your discharge instructions.  Please be prepared to give your support person's name and telephone number to Central Registration. Dr Romeo Apple will need that name and phone number post procedure.

## 2022-10-24 NOTE — Progress Notes (Signed)
Chief Complaint  Patient presents with   Hand Pain    Discuss Carpal tunnel surgery right hand    Dr Hilda Lias referred   52 year old female with pain and paresthesias in her right upper extremity which failed to respond to anti-inflammatories and bracing   pain at night numbness and tingling at night pain and paresthesias thumb index and long finger  Past Medical History:  Diagnosis Date   Arthritis    wrists and right hip, right knee   Diabetes mellitus without complication (HCC)    GERD (gastroesophageal reflux disease)    Hyperlipidemia    Hypertension    Pneumonia    Sleep apnea    Past Surgical History:  Procedure Laterality Date   ABDOMINAL HYSTERECTOMY  2010   for heavy bleeding   BIOPSY  09/21/2021   Procedure: BIOPSY;  Surgeon: Lanelle Bal, DO;  [facility];  Service: Endoscopy;;   COLONOSCOPY N/A 10/14/2014   Procedure: COLONOSCOPY;  Surgeon: West Bali, MD;  [facility];  Service: Endoscopy;  Laterality: N/A;  115 - moved to 11:15 - office notified pt   COLONOSCOPY WITH PROPOFOL N/A 09/21/2021   Procedure: COLONOSCOPY WITH PROPOFOL;  Surgeon: Lanelle Bal, DO;  [facility];  Service: Endoscopy;  Laterality: N/A;  9:30 / ASA 2   POLYPECTOMY  09/21/2021   Procedure: POLYPECTOMY;  Surgeon: Lanelle Bal, DO;  [facility];  Service: Endoscopy;;   Social History   Tobacco Use   Smoking status: Former    Types: Cigarettes    Quit date: 05/02/2012    Years since quitting: 10.4   Smokeless tobacco: Never  Vaping Use   Vaping Use: Never used  Substance Use Topics   Alcohol use: Not Currently    Comment: Last drink 2012; used to drink a case of beer every Friday   Drug use: No   Allergies  Allergen Reactions   Wellbutrin [Bupropion] Other (See Comments)    Spinal pain    Current Outpatient Medications:    B Complex-C (VITAMIN B + C COMPLEX) TABS, Take by mouth daily at 6 (six) AM. Plus Zinc, Disp: ,  Rfl:    benzonatate (TESSALON) 100 MG capsule, Take 1 capsule (100 mg total) by mouth 2 (two) times daily as needed for cough., Disp: 20 capsule, Rfl: 0   calcium-vitamin D (OSCAL WITH D) 250-125 MG-UNIT tablet, Take 1 tablet by mouth daily., Disp: , Rfl:    chlorhexidine (PERIDEX) 0.12 % solution, Mix 1 capful with water to soak dentures overnight, Disp: 473 mL, Rfl: 0   clindamycin (CLEOCIN T) 1 % lotion, Apply topically., Disp: , Rfl:    doxycycline (VIBRAMYCIN) 100 MG capsule, Take 100 mg by mouth 2 (two) times daily., Disp: , Rfl:    escitalopram (LEXAPRO) 5 MG tablet, Take 1 tablet (5 mg total) by mouth daily., Disp: 30 tablet, Rfl: 3   fluticasone (FLONASE) 50 MCG/ACT nasal spray, Place 2 sprays into both nostrils daily., Disp: 16 g, Rfl: 5   HYDROcodone-acetaminophen (NORCO/VICODIN) 5-325 MG tablet, One tablet every four hours as needed for acute pain.  Limit of five days per [place] statue., Disp: 30 tablet, Rfl: 0   ibuprofen (ADVIL,MOTRIN) 200 MG tablet, Take 400 mg by mouth daily. , Disp: , Rfl:    ipratropium (ATROVENT) 0.03 % nasal spray, Place 2 sprays into both nostrils every 12 (twelve) hours., Disp: 30 mL, Rfl: 0   lisinopril-hydrochlorothiazide (ZESTORETIC) 10-12.5 MG  tablet, Take 1 tablet by mouth daily., Disp: 90 tablet, Rfl: 3   LYSINE ACETATE PO, Take by mouth daily at 6 (six) AM., Disp: , Rfl:    omeprazole (PRILOSEC) 20 MG capsule, Take 1 capsule (20 mg total) by mouth daily as needed., Disp: 30 capsule, Rfl: 3   rosuvastatin (CRESTOR) 10 MG tablet, Take 1 tablet by mouth once daily, Disp: 90 tablet, Rfl: 0   Simethicone (PHAZYME PO), Take 1 capsule by mouth as needed., Disp: , Rfl:    UNABLE TO FIND, daily at 6 (six) AM. Med Name: Burn 60  Takes 2 tablets daily., Disp: , Rfl:    VITAMIN D PO, Take by mouth daily at 6 (six) AM., Disp: , Rfl:    metFORMIN (GLUCOPHAGE-XR) 500 MG 24 hr tablet, Take 1 tablet by mouth once daily with breakfast, Disp: 90 tablet, Rfl: 0    Semaglutide-Weight Management (WEGOVY) 2.4 MG/0.75ML SOAJ, Inject 2.4 mg into the skin once a week., Disp: 4 mL, Rfl: 0  BP (!) 148/92   Pulse 85   Ht 5\' 5"  (1.651 m)   Wt 199 lb (90.3 kg)   BMI 33.12 kg/m  Physical Exam Vitals and nursing note reviewed.  Constitutional:      Appearance: Normal appearance.  HENT:     Head: Normocephalic and atraumatic.  Eyes:     General: No scleral icterus.       Right eye: No discharge.        Left eye: No discharge.     Extraocular Movements: Extraocular movements intact.     Conjunctiva/sclera: Conjunctivae normal.     Pupils: Pupils are equal, round, and reactive to light.  Cardiovascular:     Rate and Rhythm: Normal rate.     Pulses: Normal pulses.  Musculoskeletal:     Comments: Right hand looks normal range of motion is normal there is no tenderness there is no instability muscle tone is normal there is no atrophy there is altered sensation in the thumb index and long finger Tinel's is negative Phalen's is negative    Skin:    General: Skin is warm and dry.     Capillary Refill: Capillary refill takes less than 2 seconds.  Neurological:     General: No focal deficit present.     Mental Status: She is alert and oriented to person, place, and time.  Psychiatric:        Mood and Affect: Mood normal.        Behavior: Behavior normal.        Thought Content: Thought content normal.        Judgment: Judgment normal.    Nerve study Impression: The above electrodiagnostic study is ABNORMAL and reveals evidence of a moderate right median nerve entrapment at the wrist (carpal tunnel syndrome) affecting sensory and motor components.    There is no significant electrodiagnostic evidence of any other focal nerve entrapment, brachial plexopathy or cervical radiculopathy.  As you know, this particular electrodiagnostic study cannot rule out chemical radiculitis or sensory only radiculopathy.  Encounter Diagnoses  Name Primary?   Carpal  tunnel syndrome of right wrist Yes   Pre-op exam     The procedure has been fully reviewed with the patient; The risks and benefits of surgery have been discussed and explained and understood. Alternative treatment has also been reviewed, questions were encouraged and answered. The postoperative plan is also been reviewed.  I gave the patient the option of continued nonoperative care she  says it is not working so she would like to proceed with surgery  I reviewed the surgery and the rehab and follow-up time  Proceed with right carpal tunnel release

## 2022-10-24 NOTE — Telephone Encounter (Signed)
[facility] PT left message stating that the order needs to be changed from PT to OT for this patient so they can get her scheduled.

## 2022-10-31 NOTE — Patient Instructions (Signed)
Teresa Russell  10/31/2022     @PREFPERIOPPHARMACY @   Your procedure is scheduled on  11/08/2022.   Report to [facility] at  0825  A.M.   Call this number if you have problems the morning of surgery:  [phone]  If you experience any cold or flu symptoms such as cough, fever, chills, shortness of breath, etc. between now and your scheduled surgery, please notify us at the above number.   Remember:  Do not eat or drink after midnight.       Your last dose of wegovy should be on 10/31/2022.     DO NOT take any medications for diabetes the morning of your procedure.     Take these medicines the morning of surgery with A SIP OF WATER           lexapro, hydrocodone(if needed), omeprazole.     Do not wear jewelry, make-up or nail polish, including gel polish,  artificial nails, or any other type of covering on natural nails (fingers and  toes).  Do not wear lotions, powders, or perfumes, or deodorant.  Do not shave 48 hours prior to surgery.  Men may shave face and neck.  Do not bring valuables to the hospital.  [facility] is not responsible for any belongings or valuables.  Contacts, dentures or bridgework may not be worn into surgery.  Leave your suitcase in the car.  After surgery it may be brought to your room.  For patients admitted to the hospital, discharge time will be determined by your treatment team.  Patients discharged the day of surgery will not be allowed to drive home and must have someone with them for 24 hours.     Special instructions:   DO NOT smoke tobacco or vape for 24 hours before your procedure.  Please read over the following fact sheets that you were given. Coughing and Deep Breathing, Surgical Site Infection Prevention, Anesthesia Post-op Instructions, and Care and Recovery After Surgery       Open Carpal Tunnel Release, Care After This sheet gives you information about how to care for yourself after your procedure. Your health  care provider may also give you more specific instructions. If you have problems or questions, contact your health care provider. What can I expect after the procedure? After the procedure, it is common to have: Pain. Swelling. Wrist stiffness. Bruising. Follow these instructions at home: Medicines Take over-the-counter and prescription medicines only as told by your health care provider. Ask your health care provider if the medicine prescribed to you: Requires you to avoid driving or using machinery. Can cause constipation. You may need to take these actions to prevent or treat constipation: Drink enough fluid to keep your urine pale yellow. Take over-the-counter or prescription medicines. Eat foods that are high in fiber, such as beans, whole grains, and fresh fruits and vegetables. Limit foods that are high in fat and processed sugars, such as fried or sweet foods. Bathing Do not take baths, swim, or use a hot tub until your health care provider approves. Ask your health care provider if you may take showers. Keep your bandage (dressing) dry until your health care provider says it can be removed. Cover it with a watertight covering when you take a bath or a shower. If you have a splint or brace: Wear the splint or brace as told by your health care provider. You may need to wear it for 2-3 weeks. Remove  it only as told by your health care provider. Loosen the splint or brace if your fingers tingle, become numb, or turn cold and blue. Keep the splint or brace clean. If the splint or brace is not waterproof: Do not let it get wet. Cover it with a watertight covering when you take a bath or a shower. Incision care  After the compression bandage has been removed, follow instructions from your health care provider about how to take care of your incision. Make sure you: Wash your hands with soap and water for at least 20 seconds before and after you change your bandage (dressing). If soap  and water are not available, use hand sanitizer. Change your dressing as told by your health care provider. Leave stitches (sutures), skin glue, or adhesive strips in place. These skin closures may need to stay in place for 2 weeks or longer. If adhesive strip edges start to loosen and curl up, you may trim the loose edges. Do not remove adhesive strips completely unless your health care provider tells you to do that. Check your incision area every day for signs of infection. Check for: Redness. More swelling or pain. Fluid or blood. Warmth. Pus or a bad smell. Managing pain, stiffness, and swelling  If directed, put ice on the affected area. If you have a removable splint or brace, remove it as told by your health care provider. Put ice in a plastic bag. Place a towel between your skin and the bag. Leave the ice on for 20 minutes, 2-3 times a day. Do not fall asleep with ice pack on your skin. Remove the ice if your skin turns bright red. This is very important. If you cannot feel pain, heat, or cold, you have a greater risk of damage to the area. Move your fingers often to avoid stiffness and to lessen swelling. Raise (elevate) your wrist above the level of your heart while you are sitting or lying down. Activity Do not drive until your health care provider approves. Use your hand carefully. Do not do activities that cause pain. You should be able to do light activities with your hand. Do not lift with your affected hand until your health care provider approves. Avoid pulling and pushing with the injured arm. Return to your normal activities as told by your health care provider. Ask your health care provider what activities are safe for you. If physical therapy was prescribed, do exercises as told by your therapist. Physical therapy can help you heal faster and regain movement. General instructions Do not use any products that contain nicotine or tobacco, such as cigarettes and  e-cigarettes. These can delay incision healing after surgery. If you need help quitting, ask your health care provider. Keep all follow-up visits. This is important. These include visits for physical therapy. Contact a health care provider if: You have redness around your incision. You have more swelling or pain. You have fluid or blood coming from your incision. Your incision feels warm to the touch. You have pus or a bad smell coming from your incision. You have a fever or chills. You have pain that does not get better with medicine. Your carpal tunnel symptoms do not go away after 2 months. Your carpal tunnel symptoms go away and then come back. Get help right away if: You have pain or numbness that is getting worse. Your fingers or fingertips become very pale or bluish in color. You are not able to move your fingers. Summary It is  common to have wrist stiffness and bruising after a carpal tunnel release. Icing and raising (elevating) your wrist may help to lessen swelling and pain. Call your health care provider if you have a fever or notice any signs of infection in your incision area. This information is not intended to replace advice given to you by your health care provider. Make sure you discuss any questions you have with your health care provider. Document Revised: 08/22/2019 Document Reviewed: 08/22/2019 Elsevier Patient Education  2024 [employer] Monitored Anesthesia Care, Care After The following information offers guidance on how to care for yourself after your procedure. Your health care provider may also give you more specific instructions. If you have problems or questions, contact your health care provider. What can I expect after the procedure? After the procedure, it is common to have: Tiredness. Little or no memory about what happened during or after the procedure. Impaired judgment when it comes to making decisions. Nausea or vomiting. Some trouble with  balance. Follow these instructions at home: For the time period you were told by your health care provider:  Rest. Do not participate in activities where you could fall or become injured. Do not drive or use machinery. Do not drink alcohol. Do not take sleeping pills or medicines that cause drowsiness. Do not make important decisions or sign legal documents. Do not take care of children on your own. Medicines Take over-the-counter and prescription medicines only as told by your health care provider. If you were prescribed antibiotics, take them as told by your health care provider. Do not stop using the antibiotic even if you start to feel better. Eating and drinking Follow instructions from your health care provider about what you may eat and drink. Drink enough fluid to keep your urine pale yellow. If you vomit: Drink clear fluids slowly and in small amounts as you are able. Clear fluids include water, ice chips, low-calorie sports drinks, and fruit juice that has water added to it (diluted fruit juice). Eat light and bland foods in small amounts as you are able. These foods include bananas, applesauce, rice, lean meats, toast, and crackers. General instructions  Have a responsible adult stay with you for the time you are told. It is important to have someone help care for you until you are awake and alert. If you have sleep apnea, surgery and some medicines can increase your risk for breathing problems. Follow instructions from your health care provider about wearing your sleep device: When you are sleeping. This includes during daytime naps. While taking prescription pain medicines, sleeping medicines, or medicines that make you drowsy. Do not use any products that contain nicotine or tobacco. These products include cigarettes, chewing tobacco, and vaping devices, such as e-cigarettes. If you need help quitting, ask your health care provider. Contact a health care provider if: You  feel nauseous or vomit every time you eat or drink. You feel light-headed. You are still sleepy or having trouble with balance after 24 hours. You get a rash. You have a fever. You have redness or swelling around the IV site. Get help right away if: You have trouble breathing. You have new confusion after you get home. These symptoms may be an emergency. Get help right away. Call 911. Do not wait to see if the symptoms will go away. Do not drive yourself to the hospital. This information is not intended to replace advice given to you by your health care provider. Make sure you discuss any questions  you have with your health care provider. Document Revised: 09/13/2021 Document Reviewed: 09/13/2021 Elsevier Patient Education  2024 [employer] How to Use Chlorhexidine Before Surgery Chlorhexidine gluconate (CHG) is a germ-killing (antiseptic) solution that is used to clean the skin. It can get rid of the bacteria that normally live on the skin and can keep them away for about 24 hours. To clean your skin with CHG, you may be given: A CHG solution to use in the shower or as part of a sponge bath. A prepackaged cloth that contains CHG. Cleaning your skin with CHG may help lower the risk for infection: While you are staying in the intensive care unit of the hospital. If you have a vascular access, such as a central line, to provide short-term or long-term access to your veins. If you have a catheter to drain urine from your bladder. If you are on a ventilator. A ventilator is a machine that helps you breathe by moving air in and out of your lungs. After surgery. What are the risks? Risks of using CHG include: A skin reaction. Hearing loss, if CHG gets in your ears and you have a perforated eardrum. Eye injury, if CHG gets in your eyes and is not rinsed out. The CHG product catching fire. Make sure that you avoid smoking and flames after applying CHG to your skin. Do not use CHG: If you  have a chlorhexidine allergy or have previously reacted to chlorhexidine. On babies younger than 35 months of age. How to use CHG solution Use CHG only as told by your health care provider, and follow the instructions on the label. Use the full amount of CHG as directed. Usually, this is one bottle. During a shower Follow these steps when using CHG solution during a shower (unless your health care provider gives you different instructions): Start the shower. Use your normal soap and shampoo to wash your face and hair. Turn off the shower or move out of the shower stream. Pour the CHG onto a clean washcloth. Do not use any type of brush or rough-edged sponge. Starting at your neck, lather your body down to your toes. Make sure you follow these instructions: If you will be having surgery, pay special attention to the part of your body where you will be having surgery. Scrub this area for at least 1 minute. Do not use CHG on your head or face. If the solution gets into your ears or eyes, rinse them well with water. Avoid your genital area. Avoid any areas of skin that have broken skin, cuts, or scrapes. Scrub your back and under your arms. Make sure to wash skin folds. Let the lather sit on your skin for 1-2 minutes or as long as told by your health care provider. Thoroughly rinse your entire body in the shower. Make sure that all body creases and crevices are rinsed well. Dry off with a clean towel. Do not put any substances on your body afterward--such as powder, lotion, or perfume--unless you are told to do so by your health care provider. Only use lotions that are recommended by the manufacturer. Put on clean clothes or pajamas. If it is the night before your surgery, sleep in clean sheets.  During a sponge bath Follow these steps when using CHG solution during a sponge bath (unless your health care provider gives you different instructions): Use your normal soap and shampoo to wash your  face and hair. Pour the CHG onto a clean washcloth. Starting at  your neck, lather your body down to your toes. Make sure you follow these instructions: If you will be having surgery, pay special attention to the part of your body where you will be having surgery. Scrub this area for at least 1 minute. Do not use CHG on your head or face. If the solution gets into your ears or eyes, rinse them well with water. Avoid your genital area. Avoid any areas of skin that have broken skin, cuts, or scrapes. Scrub your back and under your arms. Make sure to wash skin folds. Let the lather sit on your skin for 1-2 minutes or as long as told by your health care provider. Using a different clean, wet washcloth, thoroughly rinse your entire body. Make sure that all body creases and crevices are rinsed well. Dry off with a clean towel. Do not put any substances on your body afterward--such as powder, lotion, or perfume--unless you are told to do so by your health care provider. Only use lotions that are recommended by the manufacturer. Put on clean clothes or pajamas. If it is the night before your surgery, sleep in clean sheets. How to use CHG prepackaged cloths Only use CHG cloths as told by your health care provider, and follow the instructions on the label. Use the CHG cloth on clean, dry skin. Do not use the CHG cloth on your head or face unless your health care provider tells you to. When washing with the CHG cloth: Avoid your genital area. Avoid any areas of skin that have broken skin, cuts, or scrapes. Before surgery Follow these steps when using a CHG cloth to clean before surgery (unless your health care provider gives you different instructions): Using the CHG cloth, vigorously scrub the part of your body where you will be having surgery. Scrub using a back-and-forth motion for 3 minutes. The area on your body should be completely wet with CHG when you are done scrubbing. Do not rinse. Discard the  cloth and let the area air-dry. Do not put any substances on the area afterward, such as powder, lotion, or perfume. Put on clean clothes or pajamas. If it is the night before your surgery, sleep in clean sheets.  For general bathing Follow these steps when using CHG cloths for general bathing (unless your health care provider gives you different instructions). Use a separate CHG cloth for each area of your body. Make sure you wash between any folds of skin and between your fingers and toes. Wash your body in the following order, switching to a new cloth after each step: The front of your neck, shoulders, and chest. Both of your arms, under your arms, and your hands. Your stomach and groin area, avoiding the genitals. Your right leg and foot. Your left leg and foot. The back of your neck, your back, and your buttocks. Do not rinse. Discard the cloth and let the area air-dry. Do not put any substances on your body afterward--such as powder, lotion, or perfume--unless you are told to do so by your health care provider. Only use lotions that are recommended by the manufacturer. Put on clean clothes or pajamas. Contact a health care provider if: Your skin gets irritated after scrubbing. You have questions about using your solution or cloth. You swallow any chlorhexidine. Call your local poison control center [phone] in the [place] Get help right away if: Your eyes itch badly, or they become very red or swollen. Your skin itches badly and is red or swollen. Your  hearing changes. You have trouble seeing. You have swelling or tingling in your mouth or throat. You have trouble breathing. These symptoms may represent a serious problem that is an emergency. Do not wait to see if the symptoms will go away. Get medical help right away. Call your local emergency services (911 in the [place] Do not drive yourself to the hospital. Summary Chlorhexidine gluconate (CHG) is a germ-killing  (antiseptic) solution that is used to clean the skin. Cleaning your skin with CHG may help to lower your risk for infection. You may be given CHG to use for bathing. It may be in a bottle or in a prepackaged cloth to use on your skin. Carefully follow your health care provider's instructions and the instructions on the product label. Do not use CHG if you have a chlorhexidine allergy. Contact your health care provider if your skin gets irritated after scrubbing. This information is not intended to replace advice given to you by your health care provider. Make sure you discuss any questions you have with your health care provider. Document Revised: 08/16/2021 Document Reviewed: 06/29/2020 Elsevier Patient Education  2023 [employer].

## 2022-11-02 ENCOUNTER — Encounter (HOSPITAL_COMMUNITY)
Admission: RE | Admit: 2022-11-02 | Discharge: 2022-11-02 | Disposition: A | Discharge status: Home / Self Care | Payer: Federal, State, Local not specified - PPO | Source: Ambulatory Visit | Attending: Orthopedic Surgery | Admitting: Orthopedic Surgery

## 2022-11-02 ENCOUNTER — Encounter (HOSPITAL_COMMUNITY): Payer: Self-pay

## 2022-11-02 VITALS — BP 148/88 | HR 85 | Temp 98.5°F | Resp 18 | Ht 65.0 in | Wt 199.0 lb

## 2022-11-02 DIAGNOSIS — Z01818 Encounter for other preprocedural examination: Secondary | ICD-10-CM | POA: Insufficient documentation

## 2022-11-02 DIAGNOSIS — G5601 Carpal tunnel syndrome, right upper limb: Secondary | ICD-10-CM | POA: Insufficient documentation

## 2022-11-02 DIAGNOSIS — I10 Essential (primary) hypertension: Secondary | ICD-10-CM | POA: Insufficient documentation

## 2022-11-02 LAB — BASIC METABOLIC PANEL
Anion gap: 10 (ref 5–15)
BUN: 12 mg/dL (ref 6–20)
CO2: 24 mmol/L (ref 22–32)
Calcium: 9.2 mg/dL (ref 8.9–10.3)
Chloride: 105 mmol/L (ref 98–111)
Creatinine, Ser: 0.56 mg/dL (ref 0.44–1.00)
GFR, Estimated: >60 mL/min (ref >60)
Glucose, Bld: 114 mg/dL — ABNORMAL HIGH (ref 70–99)
Potassium: 3.3 mmol/L — ABNORMAL LOW (ref 3.5–5.1)
Sodium: 139 mmol/L (ref 135–145)

## 2022-11-02 LAB — CBC WITH DIFFERENTIAL/PLATELET
Abs Immature Granulocytes: 0.03 10*3/uL (ref 0.00–0.07)
Basophils Absolute: 0 10*3/uL (ref 0.0–0.1)
Basophils Relative: 1 %
Eosinophils Absolute: 0.3 10*3/uL (ref 0.0–0.5)
Eosinophils Relative: 5 %
HCT: 36.4 % (ref 36.0–46.0)
Hemoglobin: 12.5 g/dL (ref 12.0–15.0)
Immature Granulocytes: 1 %
Lymphocytes Relative: 45 %
Lymphs Abs: 3 10*3/uL (ref 0.7–4.0)
MCH: 29.5 pg (ref 26.0–34.0)
MCHC: 34.3 g/dL (ref 30.0–36.0)
MCV: 85.8 fL (ref 80.0–100.0)
Monocytes Absolute: 0.4 10*3/uL (ref 0.1–1.0)
Monocytes Relative: 7 %
Neutro Abs: 2.6 10*3/uL (ref 1.7–7.7)
Neutrophils Relative %: 41 %
Platelets: 315 10*3/uL (ref 150–400)
RBC: 4.24 MIL/uL (ref 3.87–5.11)
RDW: 12.7 % (ref 11.5–15.5)
WBC: 6.4 10*3/uL (ref 4.0–10.5)
nRBC: 0 % (ref 0.0–0.2)

## 2022-11-04 NOTE — H&P (Signed)
History and physical for outpatient surgery  Diagnosis right carpal tunnel syndrome  Procedure planned right carpal tunnel release open technique   Chief Complaint  Patient presents with   Hand Pain      Discuss Carpal tunnel surgery right hand     Dr Hilda Lias referred    52 year old female with pain and paresthesias in her right upper extremity which failed to respond to anti-inflammatories and bracing    pain at night numbness and tingling at night pain and paresthesias thumb index and long finger    Review of systems no chest pain shortness of breath skin infection or easy bruising or bleeding or anesthesia problems       Past Medical History:  Diagnosis Date   Arthritis      wrists and right hip, right knee   Diabetes mellitus without complication (HCC)     GERD (gastroesophageal reflux disease)     Hyperlipidemia     Hypertension     Pneumonia     Sleep apnea           Past Surgical History:  Procedure Laterality Date   ABDOMINAL HYSTERECTOMY   2010    for heavy bleeding   BIOPSY   09/21/2021    Procedure: BIOPSY;  Surgeon: Lanelle Bal, DO;  Location: [facility];  Service: Endoscopy;;   COLONOSCOPY N/A 10/14/2014    Procedure: COLONOSCOPY;  Surgeon: West Bali, MD;  Location: [facility];  Service: Endoscopy;  Laterality: N/A;  115 - moved to 11:15 - office notified pt   COLONOSCOPY WITH PROPOFOL N/A 09/21/2021    Procedure: COLONOSCOPY WITH PROPOFOL;  Surgeon: Lanelle Bal, DO;  Location: [facility];  Service: Endoscopy;  Laterality: N/A;  9:30 / ASA 2   POLYPECTOMY   09/21/2021    Procedure: POLYPECTOMY;  Surgeon: Lanelle Bal, DO;  Location: [facility];  Service: Endoscopy;;    Social History         Tobacco Use   Smoking status: Former      Types: Cigarettes      Quit date: 05/02/2012      Years since quitting: 10.4   Smokeless tobacco: Never  Vaping Use   Vaping Use: Never used  Substance Use Topics   Alcohol use: Not  Currently      Comment: Last drink 2012; used to drink a case of beer every Friday   Drug use: No         Allergies  Allergen Reactions   Wellbutrin [Bupropion] Other (See Comments)      Spinal pain      Current Outpatient Medications:    B Complex-C (VITAMIN B + C COMPLEX) TABS, Take by mouth daily at 6 (six) AM. Plus Zinc, Disp: , Rfl:    benzonatate (TESSALON) 100 MG capsule, Take 1 capsule (100 mg total) by mouth 2 (two) times daily as needed for cough., Disp: 20 capsule, Rfl: 0   calcium-vitamin D (OSCAL WITH D) 250-125 MG-UNIT tablet, Take 1 tablet by mouth daily., Disp: , Rfl:    chlorhexidine (PERIDEX) 0.12 % solution, Mix 1 capful with water to soak dentures overnight, Disp: 473 mL, Rfl: 0   clindamycin (CLEOCIN T) 1 % lotion, Apply topically., Disp: , Rfl:    doxycycline (VIBRAMYCIN) 100 MG capsule, Take 100 mg by mouth 2 (two) times daily., Disp: , Rfl:    escitalopram (LEXAPRO) 5 MG tablet, Take 1 tablet (5 mg total) by mouth daily., Disp:  30 tablet, Rfl: 3   fluticasone (FLONASE) 50 MCG/ACT nasal spray, Place 2 sprays into both nostrils daily., Disp: 16 g, Rfl: 5   HYDROcodone-acetaminophen (NORCO/VICODIN) 5-325 MG tablet, One tablet every four hours as needed for acute pain.  Limit of five days per [place]., Disp: 30 tablet, Rfl: 0   ibuprofen (ADVIL,MOTRIN) 200 MG tablet, Take 400 mg by mouth daily. , Disp: , Rfl:    ipratropium (ATROVENT) 0.03 % nasal spray, Place 2 sprays into both nostrils every 12 (twelve) hours., Disp: 30 mL, Rfl: 0   lisinopril-hydrochlorothiazide (ZESTORETIC) 10-12.5 MG tablet, Take 1 tablet by mouth daily., Disp: 90 tablet, Rfl: 3   LYSINE ACETATE PO, Take by mouth daily at 6 (six) AM., Disp: , Rfl:    omeprazole (PRILOSEC) 20 MG capsule, Take 1 capsule (20 mg total) by mouth daily as needed., Disp: 30 capsule, Rfl: 3   rosuvastatin (CRESTOR) 10 MG tablet, Take 1 tablet by mouth once daily, Disp: 90 tablet, Rfl: 0   Simethicone (PHAZYME PO),  Take 1 capsule by mouth as needed., Disp: , Rfl:    UNABLE TO FIND, daily at 6 (six) AM. Med Name: Burn 60  Takes 2 tablets daily., Disp: , Rfl:    VITAMIN D PO, Take by mouth daily at 6 (six) AM., Disp: , Rfl:    metFORMIN (GLUCOPHAGE-XR) 500 MG 24 hr tablet, Take 1 tablet by mouth once daily with breakfast, Disp: 90 tablet, Rfl: 0   Semaglutide-Weight Management (WEGOVY) 2.4 MG/0.75ML SOAJ, Inject 2.4 mg into the skin once a week., Disp: 4 mL, Rfl: 0   BP (!) 148/92   Pulse 85   Ht 5\' 5"  (1.651 m)   Wt 199 lb (90.3 kg)   BMI 33.12 kg/m  Physical Exam Vitals and nursing note reviewed.  Constitutional:      Appearance: Normal appearance.  HENT:     Head: Normocephalic and atraumatic.  Eyes:     General: No scleral icterus.       Right eye: No discharge.        Left eye: No discharge.     Extraocular Movements: Extraocular movements intact.     Conjunctiva/sclera: Conjunctivae normal.     Pupils: Pupils are equal, round, and reactive to light.  Cardiovascular:     Rate and Rhythm: Normal rate.     Pulses: Normal pulses.  Musculoskeletal:     Comments: Right hand looks normal range of motion is normal there is no tenderness there is no instability muscle tone is normal there is no atrophy there is altered sensation in the thumb index and long finger Tinel's is negative Phalen's is negative     Skin:    General: Skin is warm and dry.     Capillary Refill: Capillary refill takes less than 2 seconds.  Neurological:     General: No focal deficit present.     Mental Status: She is alert and oriented to person, place, and time.  Psychiatric:        Mood and Affect: Mood normal.        Behavior: Behavior normal.        Thought Content: Thought content normal.        Judgment: Judgment normal.      Nerve study Impression: The above electrodiagnostic study is ABNORMAL and reveals evidence of a moderate right median nerve entrapment at the wrist (carpal tunnel syndrome) affecting  sensory and motor components.    There is no  significant electrodiagnostic evidence of any other focal nerve entrapment, brachial plexopathy or cervical radiculopathy.  As you know, this particular electrodiagnostic study cannot rule out chemical radiculitis or sensory only radiculopathy.       Encounter Diagnoses  Name Primary?   Carpal tunnel syndrome of right wrist Yes   Pre-op exam        The procedure has been fully reviewed with the patient; The risks and benefits of surgery have been discussed and explained and understood. Alternative treatment has also been reviewed, questions were encouraged and answered. The postoperative plan is also been reviewed.   I gave the patient the option of continued nonoperative care she says it is not working so she would like to proceed with surgery   I reviewed the surgery and the rehab and follow-up time   Proceed with right carpal tunnel release

## 2022-11-08 ENCOUNTER — Encounter (HOSPITAL_COMMUNITY)
Admission: RE | Disposition: A | Discharge status: Home / Self Care | Payer: Self-pay | Source: Home / Self Care | Attending: Orthopedic Surgery

## 2022-11-08 ENCOUNTER — Ambulatory Visit (HOSPITAL_COMMUNITY): Payer: Federal, State, Local not specified - PPO | Admitting: Anesthesiology

## 2022-11-08 ENCOUNTER — Ambulatory Visit (HOSPITAL_COMMUNITY)
Admission: RE | Admit: 2022-11-08 | Discharge: 2022-11-08 | Disposition: A | Discharge status: Home / Self Care | Payer: Federal, State, Local not specified - PPO | Attending: Orthopedic Surgery | Admitting: Orthopedic Surgery

## 2022-11-08 ENCOUNTER — Encounter (HOSPITAL_COMMUNITY): Payer: Self-pay | Admitting: Orthopedic Surgery

## 2022-11-08 ENCOUNTER — Other Ambulatory Visit: Payer: Self-pay

## 2022-11-08 DIAGNOSIS — E119 Type 2 diabetes mellitus without complications: Secondary | ICD-10-CM | POA: Diagnosis not present

## 2022-11-08 DIAGNOSIS — K219 Gastro-esophageal reflux disease without esophagitis: Secondary | ICD-10-CM | POA: Diagnosis not present

## 2022-11-08 DIAGNOSIS — I10 Essential (primary) hypertension: Secondary | ICD-10-CM | POA: Diagnosis not present

## 2022-11-08 DIAGNOSIS — F32A Depression, unspecified: Secondary | ICD-10-CM | POA: Insufficient documentation

## 2022-11-08 DIAGNOSIS — Z87891 Personal history of nicotine dependence: Secondary | ICD-10-CM | POA: Diagnosis not present

## 2022-11-08 DIAGNOSIS — G5601 Carpal tunnel syndrome, right upper limb: Secondary | ICD-10-CM | POA: Diagnosis not present

## 2022-11-08 DIAGNOSIS — Z7984 Long term (current) use of oral hypoglycemic drugs: Secondary | ICD-10-CM | POA: Insufficient documentation

## 2022-11-08 HISTORY — PX: CARPAL TUNNEL RELEASE: SHX101

## 2022-11-08 LAB — GLUCOSE, CAPILLARY: Glucose-Capillary: 113 mg/dL — ABNORMAL HIGH (ref 70–99)

## 2022-11-08 SURGERY — CARPAL TUNNEL RELEASE
Anesthesia: Regional | Site: Hand | Laterality: Right

## 2022-11-08 MED ORDER — 0.9 % SODIUM CHLORIDE (POUR BTL) OPTIME
TOPICAL | Status: DC | PRN
  Administered 2022-11-08: 1000 mL

## 2022-11-08 MED ORDER — PROPOFOL 500 MG/50ML IV EMUL
INTRAVENOUS | Status: DC | PRN
  Administered 2022-11-08: 100 ug/kg/min via INTRAVENOUS

## 2022-11-08 MED ORDER — PROPOFOL 10 MG/ML IV BOLUS
INTRAVENOUS | Status: AC
  Filled 2022-11-08: qty 20

## 2022-11-08 MED ORDER — CHLORHEXIDINE GLUCONATE 0.12 % MT SOLN
OROMUCOSAL | Status: AC
  Administered 2022-11-08: 15 mL via OROMUCOSAL
  Filled 2022-11-08: qty 15

## 2022-11-08 MED ORDER — HYDROMORPHONE HCL 1 MG/ML IJ SOLN: 0.2500 mg | INTRAMUSCULAR | Status: DC | PRN

## 2022-11-08 MED ORDER — CHLORHEXIDINE GLUCONATE 0.12 % MT SOLN: 15.0000 mL | Freq: Once | OROMUCOSAL | Status: AC

## 2022-11-08 MED ORDER — CEFAZOLIN SODIUM-DEXTROSE 2-4 GM/100ML-% IV SOLN
2.0000 g | INTRAVENOUS | Status: AC
  Administered 2022-11-08: 2 g via INTRAVENOUS

## 2022-11-08 MED ORDER — HYDROCODONE-ACETAMINOPHEN 5-325 MG PO TABS: 1.0000 | ORAL_TABLET | ORAL | 0 refills | Status: DC | PRN

## 2022-11-08 MED ORDER — CEFAZOLIN SODIUM-DEXTROSE 2-4 GM/100ML-% IV SOLN
INTRAVENOUS | Status: AC
  Filled 2022-11-08: qty 100

## 2022-11-08 MED ORDER — LACTATED RINGERS IV SOLN: INTRAVENOUS | Status: DC

## 2022-11-08 MED ORDER — METOCLOPRAMIDE HCL 5 MG/ML IJ SOLN
10.0000 mg | Freq: Once | INTRAMUSCULAR | Status: AC
  Administered 2022-11-08: 10 mg via INTRAVENOUS

## 2022-11-08 MED ORDER — METOCLOPRAMIDE HCL 5 MG/ML IJ SOLN
INTRAMUSCULAR | Status: AC
  Filled 2022-11-08: qty 2

## 2022-11-08 MED ORDER — ONDANSETRON HCL 4 MG/2ML IJ SOLN: 4.0000 mg | Freq: Once | INTRAMUSCULAR | Status: DC | PRN

## 2022-11-08 MED ORDER — MIDAZOLAM HCL 2 MG/2ML IJ SOLN
INTRAMUSCULAR | Status: DC | PRN
  Administered 2022-11-08: 2 mg via INTRAVENOUS

## 2022-11-08 MED ORDER — LIDOCAINE HCL (PF) 0.5 % IJ SOLN
INTRAMUSCULAR | Status: AC
  Filled 2022-11-08: qty 50

## 2022-11-08 MED ORDER — LIDOCAINE HCL (PF) 0.5 % IJ SOLN
INTRAMUSCULAR | Status: DC | PRN
  Administered 2022-11-08: 50 mL via INTRAVENOUS

## 2022-11-08 MED ORDER — PROPOFOL 10 MG/ML IV BOLUS
INTRAVENOUS | Status: DC | PRN
  Administered 2022-11-08: 30 mg via INTRAVENOUS
  Administered 2022-11-08 (×2): 20 mg via INTRAVENOUS

## 2022-11-08 MED ORDER — ORAL CARE MOUTH RINSE: 15.0000 mL | Freq: Once | OROMUCOSAL | Status: AC

## 2022-11-08 MED ORDER — IBUPROFEN 800 MG PO TABS: 800.0000 mg | ORAL_TABLET | Freq: Three times a day (TID) | ORAL | 1 refills | Status: DC | PRN

## 2022-11-08 MED ORDER — MIDAZOLAM HCL 2 MG/2ML IJ SOLN
INTRAMUSCULAR | Status: AC
  Filled 2022-11-08: qty 2

## 2022-11-08 MED ORDER — MEPERIDINE HCL 50 MG/ML IJ SOLN: 6.2500 mg | INTRAMUSCULAR | Status: DC | PRN

## 2022-11-08 MED ORDER — PROPOFOL 500 MG/50ML IV EMUL
INTRAVENOUS | Status: AC
  Filled 2022-11-08: qty 100

## 2022-11-08 SURGICAL SUPPLY — 34 items
APL PRP STRL LF DISP 70% ISPRP (MISCELLANEOUS) ×1
BLADE SURG 15 STRL LF DISP TIS (BLADE) ×1 IMPLANT
BLADE SURG 15 STRL SS (BLADE) ×1
BNDG CMPR STD VLCR NS LF 5.8X3 (GAUZE/BANDAGES/DRESSINGS) ×1
BNDG ELASTIC 3X5.8 VLCR NS LF (GAUZE/BANDAGES/DRESSINGS) ×1 IMPLANT
BNDG GAUZE DERMACEA FLUFF 4 (GAUZE/BANDAGES/DRESSINGS) IMPLANT
BNDG GZE DERMACEA 4 6PLY (GAUZE/BANDAGES/DRESSINGS) ×1
CHLORAPREP W/TINT 26 (MISCELLANEOUS) ×1 IMPLANT
CLOTH BEACON ORANGE TIMEOUT ST (SAFETY) ×1 IMPLANT
COVER LIGHT HANDLE STERIS (MISCELLANEOUS) ×2 IMPLANT
CUFF TOURN SGL QUICK 18X4 (TOURNIQUET CUFF) ×1 IMPLANT
ELECT NDL TIP 2.8 STRL (NEEDLE) IMPLANT
ELECT NEEDLE TIP 2.8 STRL (NEEDLE) ×1 IMPLANT
ELECT REM PT RETURN 9FT ADLT (ELECTROSURGICAL) ×1
ELECTRODE REM PT RTRN 9FT ADLT (ELECTROSURGICAL) ×1 IMPLANT
GAUZE SPONGE 4X4 12PLY STRL (GAUZE/BANDAGES/DRESSINGS) ×1 IMPLANT
GAUZE XEROFORM 1X8 LF (GAUZE/BANDAGES/DRESSINGS) ×1 IMPLANT
GLOVE BIOGEL PI IND STRL 7.0 (GLOVE) ×2 IMPLANT
GLOVE SKINSENSE STRL SZ8.0 LF (GLOVE) IMPLANT
GLOVE SS N UNI LF 8.5 STRL (GLOVE) ×1 IMPLANT
GOWN STRL REUS W/TWL LRG LVL3 (GOWN DISPOSABLE) ×1 IMPLANT
GOWN STRL REUS W/TWL XL LVL3 (GOWN DISPOSABLE) ×1 IMPLANT
KIT TURNOVER KIT A (KITS) ×1 IMPLANT
MANIFOLD NEPTUNE II (INSTRUMENTS) ×1 IMPLANT
NDL HYPO 21X1.5 SAFETY (NEEDLE) ×1 IMPLANT
NEEDLE HYPO 21X1.5 SAFETY (NEEDLE) ×1 IMPLANT
NS IRRIG 1000ML POUR BTL (IV SOLUTION) ×1 IMPLANT
PACK BASIC LIMB (CUSTOM PROCEDURE TRAY) ×1 IMPLANT
PAD ARMBOARD 7.5X6 YLW CONV (MISCELLANEOUS) ×1 IMPLANT
POSITIONER HAND ALUMI XLG (MISCELLANEOUS) ×1 IMPLANT
POSITIONER HEAD 8X9X4 ADT (SOFTGOODS) ×1 IMPLANT
SET BASIN LINEN APH (SET/KITS/TRAYS/PACK) ×1 IMPLANT
SUT ETHILON 3 0 FSL (SUTURE) ×1 IMPLANT
SYR CONTROL 10ML LL (SYRINGE) ×1 IMPLANT

## 2022-11-08 NOTE — Anesthesia Procedure Notes (Signed)
Anesthesia Regional Block: Bier block (IV Regional)   Pre-Anesthetic Checklist: , timeout performed,  Correct Patient, Correct Site, Correct Laterality,  Correct Procedure, Correct Position, site marked,  Risks and benefits discussed,  Surgical consent,  Pre-op evaluation,  At surgeon's request  Laterality: Right          Narrative:  Start time: 11/08/2022 10:30 AM End time: 11/08/2022 10:33 AM CRNA: Lorin Glass, CRNA

## 2022-11-08 NOTE — Brief Op Note (Signed)
  Date 11/08/2022    PRE-OPERATIVE DIAGNOSIS:  RIGHT  CARPAL TUNNEL SYNDROME  POST-OPERATIVE DIAGNOSIS:  RIGHT CARPAL TUNNEL SYNDROME   FINDINGS: dull purple flat nerve    PROCEDURE:  Procedure(s): RIGHT CARPAL TUNNEL RELEASE RIGHT   SURGEON:  Surgeon(s) and Role:    * Vickki Hearing, MD - Primary  PHYSICIAN ASSISTANT:   ASSISTANTS: none   ANESTHESIA:  Bier Block   BLOOD ADMINISTERED:none  DRAINS: none   LOCAL MEDICATIONS USED:  MARCAINE     SPECIMEN:  No Specimen  DISPOSITION OF SPECIMEN:  N/A  COUNTS:  YES  TOURNIQUET:  yes  DICTATION: .Dragon Dictation   Surgeon Romeo Apple  Operative findings compression of the RIGHT MEDIAN NERVE   Indications failure of conservative treatment to relieve pain and paresthesias and numbness and tingling of the RIGHT HAND   The patient was identified in the preop area we confirm the surgical site marked as right wrist. Chart update completed. Patient taken to surgery.  Antibiotic ancef 2 gm  after establishing a successful anesthesia with a BIER BLOCK AND SEDATION  the arm was prepped with sterile technique  Timeout executed completed and confirmed site. RIGHT WRIST  /  HAND   A straight incision was made over the RIGHT carpal tunnel in line with the radial border of the ring finger. Blunt dissection was carried out to find the distal aspect of the carpal tunnel. A blunted judgment was passed beneath the carpal tunnel. Sharp incision was then used to release the transverse carpal ligament. The contents of the carpal tunnel were inspected. The median nerve was FLAT AND PURPLE AND DULL.  The wound was irrigated and then closed with 3-0 nylon suture. We injected 10 mL of plain Marcaine on the radial side of the incision  A sterile bandage was applied and the tourniquet was released the color of the hand and capillary refill were normal  The patient was taken to the recovery room in stable condition  PLAN OF CARE: Discharge to  home after PACU  PATIENT DISPOSITION:  PACU - hemodynamically stable.   Delay start of Pharmacological VTE agent (>24hrs) due to surgical blood loss or risk of bleeding: not applicable

## 2022-11-08 NOTE — Anesthesia Preprocedure Evaluation (Addendum)
Anesthesia Evaluation  Patient identified by MRN, date of birth, ID band Patient awake    Reviewed: Allergy & Precautions, H&P , NPO status , Patient's Chart, lab work & pertinent test results  Airway Mallampati: III  TM Distance: >3 FB Neck ROM: Full    Dental no notable dental hx. (+) Missing, Dental Advisory Given   Pulmonary sleep apnea , pneumonia, former smoker   Pulmonary exam normal breath sounds clear to auscultation       Cardiovascular hypertension, Pt. on medications Normal cardiovascular exam Rhythm:Regular Rate:Normal     Neuro/Psych  PSYCHIATRIC DISORDERS  Depression     Neuromuscular disease    GI/Hepatic Neg liver ROS,GERD  Controlled,,  Endo/Other  diabetes, Well Controlled, Type 2, Oral Hypoglycemic Agents    Renal/GU negative Renal ROS  negative genitourinary   Musculoskeletal  (+) Arthritis , Osteoarthritis,    Abdominal   Peds negative pediatric ROS (+)  Hematology negative hematology ROS (+)   Anesthesia Other Findings   Reproductive/Obstetrics negative OB ROS                             Anesthesia Physical Anesthesia Plan  ASA: 2  Anesthesia Plan: Bier Block and Bier Block-Lidocaine Only   Post-op Pain Management: Minimal or no pain anticipated   Induction:   PONV Risk Score and Plan: 2 and Ondansetron and Dexamethasone  Airway Management Planned: Nasal Cannula, Natural Airway and Simple Face Mask  Additional Equipment:   Intra-op Plan:   Post-operative Plan:   Informed Consent: I have reviewed the patients History and Physical, chart, labs and discussed the procedure including the risks, benefits and alternatives for the proposed anesthesia with the patient or authorized representative who has indicated his/her understanding and acceptance.     Dental advisory given  Plan Discussed with: CRNA and Surgeon  Anesthesia Plan Comments: (Possible GA  with airway was explained)       Anesthesia Quick Evaluation

## 2022-11-08 NOTE — Transfer of Care (Signed)
Immediate Anesthesia Transfer of Care Note  Patient: Teresa Russell  Procedure(s) Performed: CARPAL TUNNEL RELEASE (Right: Hand)  Patient Location: Short Stay  Anesthesia Type:MAC and Bier block  Level of Consciousness: awake  Airway & Oxygen Therapy: Patient Spontanous Breathing  Post-op Assessment: Report given to RN and Post -op Vital signs reviewed and stable  Post vital signs: Reviewed and stable  Last Vitals:  Vitals Value Taken Time  BP 126/76 11/08/22 1106  Temp 36.7 C 11/08/22 1106  Pulse 88 11/08/22 1106  Resp 15   SpO2 95 % 11/08/22 1106    Last Pain:  Vitals:   11/08/22 1106  TempSrc: Oral  PainSc: Asleep         Complications: No notable events documented.

## 2022-11-08 NOTE — Op Note (Signed)
  Date 11/08/2022    PRE-OPERATIVE DIAGNOSIS:  RIGHT  CARPAL TUNNEL SYNDROME  POST-OPERATIVE DIAGNOSIS:  RIGHT CARPAL TUNNEL SYNDROME   FINDINGS: dull purple flat nerve    PROCEDURE:  Procedure(s): RIGHT CARPAL TUNNEL RELEASE RIGHT   SURGEON:  Surgeon(s) and Role:    * Link Burgeson E, MD - Primary  PHYSICIAN ASSISTANT:   ASSISTANTS: none   ANESTHESIA:  Bier Block   BLOOD ADMINISTERED:none  DRAINS: none   LOCAL MEDICATIONS USED:  MARCAINE     SPECIMEN:  No Specimen  DISPOSITION OF SPECIMEN:  N/A  COUNTS:  YES  TOURNIQUET:  yes  DICTATION: .Dragon Dictation   Surgeon Mykale Gandolfo  Operative findings compression of the RIGHT MEDIAN NERVE   Indications failure of conservative treatment to relieve pain and paresthesias and numbness and tingling of the RIGHT HAND   The patient was identified in the preop area we confirm the surgical site marked as right wrist. Chart update completed. Patient taken to surgery.  Antibiotic ancef 2 gm  after establishing a successful anesthesia with a BIER BLOCK AND SEDATION  the arm was prepped with sterile technique  Timeout executed completed and confirmed site. RIGHT WRIST  /  HAND   A straight incision was made over the RIGHT carpal tunnel in line with the radial border of the ring finger. Blunt dissection was carried out to find the distal aspect of the carpal tunnel. A blunted judgment was passed beneath the carpal tunnel. Sharp incision was then used to release the transverse carpal ligament. The contents of the carpal tunnel were inspected. The median nerve was FLAT AND PURPLE AND DULL.  The wound was irrigated and then closed with 3-0 nylon suture. We injected 10 mL of plain Marcaine on the radial side of the incision  A sterile bandage was applied and the tourniquet was released the color of the hand and capillary refill were normal  The patient was taken to the recovery room in stable condition  PLAN OF CARE: Discharge to  home after PACU  PATIENT DISPOSITION:  PACU - hemodynamically stable.   Delay start of Pharmacological VTE agent (>24hrs) due to surgical blood loss or risk of bleeding: not applicable  

## 2022-11-08 NOTE — Interval H&P Note (Signed)
History and Physical Interval Note:  11/08/2022 10:20 AM  Teresa Russell  has presented today for surgery, with the diagnosis of Right carpal tunnel syndrome.  The various methods of treatment have been discussed with the patient and family. After consideration of risks, benefits and other options for treatment, the patient has consented to  Procedure(s): CARPAL TUNNEL RELEASE (Right) as a surgical intervention.  The patient's history has been reviewed, patient examined, no change in status, stable for surgery.  I have reviewed the patient's chart and labs.  Questions were answered to the patient's satisfaction.     Fuller Canada

## 2022-11-08 NOTE — Anesthesia Postprocedure Evaluation (Signed)
Anesthesia Post Note  Patient: Susanne T Acree  Procedure(s) Performed: CARPAL TUNNEL RELEASE (Right: Hand)  Patient location during evaluation: Phase II Anesthesia Type: Bier Block Level of consciousness: awake and alert and oriented Pain management: pain level controlled Vital Signs Assessment: post-procedure vital signs reviewed and stable Respiratory status: spontaneous breathing, nonlabored ventilation and respiratory function stable Cardiovascular status: blood pressure returned to baseline and stable Postop Assessment: no apparent nausea or vomiting Anesthetic complications: no  No notable events documented.   Last Vitals:  Vitals:   11/08/22 0904 11/08/22 1106  BP: 122/86 126/76  Pulse: 83 88  Resp: 17   Temp: 36.7 C 36.7 C  SpO2: 96% 95%    Last Pain:  Vitals:   11/08/22 1112  TempSrc:   PainSc: 10-Worst pain ever                 Gabryelle Whitmoyer C [place]

## 2022-11-08 NOTE — Interval H&P Note (Signed)
History and Physical Interval Note:  11/08/2022 10:19 AM  Teresa Russell  has presented today for surgery, with the diagnosis of Right carpal tunnel syndrome.  The various methods of treatment have been discussed with the patient and family. After consideration of risks, benefits and other options for treatment, the patient has consented to  Procedure(s): CARPAL TUNNEL RELEASE (Right) as a surgical intervention.  The patient's history has been reviewed, patient examined, no change in status, stable for surgery.  I have reviewed the patient's chart and labs.  Questions were answered to the patient's satisfaction.     Fuller Canada

## 2022-11-15 ENCOUNTER — Encounter (HOSPITAL_COMMUNITY): Payer: Self-pay | Admitting: Orthopedic Surgery

## 2022-11-23 ENCOUNTER — Encounter: Payer: Self-pay | Admitting: Orthopedic Surgery

## 2022-11-23 ENCOUNTER — Ambulatory Visit (INDEPENDENT_AMBULATORY_CARE_PROVIDER_SITE_OTHER): Payer: Federal, State, Local not specified - PPO | Admitting: Orthopedic Surgery

## 2022-11-23 DIAGNOSIS — Z9889 Other specified postprocedural states: Secondary | ICD-10-CM

## 2022-11-23 NOTE — Progress Notes (Signed)
   This is a postop visit  Encounter Diagnosis  Name Primary?   S/P carpal tunnel release right 11/08/22 Yes    Chief Complaint  Patient presents with   Post-op Follow-up    11/08/22 carpal tunnel release.     The patient is status post right carpal tunnel release open technique  This is postop day number 15  Postop pain control over-the-counter medications  Subjective complaints soreness at the incision  Physical exam findings mild swelling full range of motion  Assessment and plan  She is doing very well status post carpal tunnel release.  She is not having any numbness or tingling and no night pain  PROCEDURE:  Procedure(s): RIGHT CARPAL TUNNEL RELEASE RIGHT    Return as needed return to work Monday

## 2022-12-10 ENCOUNTER — Other Ambulatory Visit: Payer: Self-pay | Admitting: Internal Medicine

## 2022-12-10 DIAGNOSIS — E669 Obesity, unspecified: Secondary | ICD-10-CM

## 2022-12-27 ENCOUNTER — Other Ambulatory Visit: Payer: Self-pay | Admitting: Internal Medicine

## 2022-12-27 ENCOUNTER — Other Ambulatory Visit: Payer: Self-pay | Admitting: Family Medicine

## 2022-12-27 DIAGNOSIS — K1379 Other lesions of oral mucosa: Secondary | ICD-10-CM

## 2022-12-27 DIAGNOSIS — I10 Essential (primary) hypertension: Secondary | ICD-10-CM

## 2022-12-27 DIAGNOSIS — E782 Mixed hyperlipidemia: Secondary | ICD-10-CM

## 2022-12-27 MED ORDER — CHLORHEXIDINE GLUCONATE 0.12 % MT SOLN
OROMUCOSAL | 0 refills | Status: DC
Start: 2022-12-27 — End: 2023-04-03

## 2023-01-06 ENCOUNTER — Encounter: Payer: Self-pay | Admitting: Pharmacist

## 2023-01-11 ENCOUNTER — Encounter: Payer: Self-pay | Admitting: Internal Medicine

## 2023-01-11 ENCOUNTER — Ambulatory Visit: Payer: Federal, State, Local not specified - PPO | Admitting: Internal Medicine

## 2023-01-11 VITALS — BP 121/81 | HR 82 | Resp 16 | Ht 65.5 in | Wt 202.0 lb

## 2023-01-11 DIAGNOSIS — E782 Mixed hyperlipidemia: Secondary | ICD-10-CM

## 2023-01-11 DIAGNOSIS — F331 Major depressive disorder, recurrent, moderate: Secondary | ICD-10-CM

## 2023-01-11 DIAGNOSIS — I10 Essential (primary) hypertension: Secondary | ICD-10-CM | POA: Diagnosis not present

## 2023-01-11 DIAGNOSIS — J301 Allergic rhinitis due to pollen: Secondary | ICD-10-CM

## 2023-01-11 DIAGNOSIS — Z2821 Immunization not carried out because of patient refusal: Secondary | ICD-10-CM

## 2023-01-11 DIAGNOSIS — E1169 Type 2 diabetes mellitus with other specified complication: Secondary | ICD-10-CM

## 2023-01-11 DIAGNOSIS — Z7984 Long term (current) use of oral hypoglycemic drugs: Secondary | ICD-10-CM

## 2023-01-11 MED ORDER — TIRZEPATIDE 2.5 MG/0.5ML ~~LOC~~ SOAJ
2.5000 mg | SUBCUTANEOUS | 0 refills | Status: DC
Start: 2023-01-11 — End: 2023-02-02

## 2023-01-11 NOTE — Assessment & Plan Note (Signed)
BP Readings from Last 1 Encounters:  01/11/23 121/81   Well-controlled with Lisinopril-HCTZ Counseled for compliance with the medications Advised DASH diet and moderate exercise/walking, at least 150 mins/week

## 2023-01-11 NOTE — Assessment & Plan Note (Signed)
Refilled Flonase Continue OTC antihistaminic

## 2023-01-11 NOTE — Assessment & Plan Note (Signed)
Lab Results  Component Value Date   HGBA1C 5.7 10/12/2022   Associated with HTN and HLD Well controlled On metformin 500 mg daily On Wegovy for weight loss currently Switched to [employer] for type II DM and obesity, and increase dose as tolerated Advised to follow diabetic diet On statin and ACEi F/u CMP and lipid panel Diabetic eye exam: Advised to follow up with Ophthalmology for diabetic eye exam
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
## 2023-01-11 NOTE — Assessment & Plan Note (Addendum)
Flowsheet Row Office Visit from 01/11/2023 in [facility]  PHQ-9 Total Score 8      Uncontrolled Tried Zoloft and Lexapro, but had increased fatigue and drowsiness with Zoloft, had headache with Lexapro Planning to see BH therapy at work Advised to engage in activities of liking and perform simple exercises or relaxation techniques (yoga or tai chi)

## 2023-01-11 NOTE — Assessment & Plan Note (Addendum)
On Crestor Check lipid profile 

## 2023-01-11 NOTE — Patient Instructions (Signed)
Please start taking Mounjaro instead of Wegovy.  Please continue to take other medications as prescribed.  Please continue to follow low carb diet and perform moderate exercise/walking at least 150 mins/week.

## 2023-01-12 LAB — CMP14+EGFR
ALT: 48 IU/L — ABNORMAL HIGH (ref 0–32)
AST: 39 IU/L (ref 0–40)
Albumin: 4.6 g/dL (ref 3.8–4.9)
Alkaline Phosphatase: 81 IU/L (ref 44–121)
BUN/Creatinine Ratio: 18 (ref 9–23)
BUN: 10 mg/dL (ref 6–24)
Bilirubin Total: 0.4 mg/dL (ref 0.0–1.2)
CO2: 21 mmol/L (ref 20–29)
Calcium: 9.9 mg/dL (ref 8.7–10.2)
Chloride: 104 mmol/L (ref 96–106)
Creatinine, Ser: 0.55 mg/dL — ABNORMAL LOW (ref 0.57–1.00)
Globulin, Total: 2.3 g/dL (ref 1.5–4.5)
Glucose: 128 mg/dL — ABNORMAL HIGH (ref 70–99)
Potassium: 4.1 mmol/L (ref 3.5–5.2)
Sodium: 141 mmol/L (ref 134–144)
Total Protein: 6.9 g/dL (ref 6.0–8.5)
eGFR: 111 mL/min/{1.73_m2} (ref >59)

## 2023-01-12 LAB — LIPID PANEL
Chol/HDL Ratio: 4.5 ratio — ABNORMAL HIGH (ref 0.0–4.4)
Cholesterol, Total: 178 mg/dL (ref 100–199)
HDL: 40 mg/dL (ref >39)
LDL Chol Calc (NIH): 73 mg/dL (ref 0–99)
Triglycerides: 408 mg/dL — ABNORMAL HIGH (ref 0–149)
VLDL Cholesterol Cal: 65 mg/dL — ABNORMAL HIGH (ref 5–40)

## 2023-01-12 LAB — HEMOGLOBIN A1C
Est. average glucose Bld gHb Est-mCnc: 128 mg/dL
Hgb A1c MFr Bld: 6.1 % — ABNORMAL HIGH (ref 4.8–5.6)

## 2023-01-16 ENCOUNTER — Other Ambulatory Visit: Payer: Self-pay | Admitting: Internal Medicine

## 2023-02-02 ENCOUNTER — Other Ambulatory Visit: Payer: Self-pay | Admitting: Internal Medicine

## 2023-02-02 ENCOUNTER — Encounter: Payer: Self-pay | Admitting: Internal Medicine

## 2023-02-02 DIAGNOSIS — E1169 Type 2 diabetes mellitus with other specified complication: Secondary | ICD-10-CM

## 2023-02-02 MED ORDER — TIRZEPATIDE 5 MG/0.5ML ~~LOC~~ SOAJ
5.0000 mg | SUBCUTANEOUS | 0 refills | Status: DC
Start: 2023-02-02 — End: 2023-05-04

## 2023-03-22 DIAGNOSIS — B078 Other viral warts: Secondary | ICD-10-CM | POA: Diagnosis not present

## 2023-03-22 DIAGNOSIS — L83 Acanthosis nigricans: Secondary | ICD-10-CM | POA: Diagnosis not present

## 2023-04-03 ENCOUNTER — Other Ambulatory Visit: Payer: Self-pay | Admitting: Internal Medicine

## 2023-04-03 DIAGNOSIS — K1379 Other lesions of oral mucosa: Secondary | ICD-10-CM

## 2023-04-03 DIAGNOSIS — E782 Mixed hyperlipidemia: Secondary | ICD-10-CM

## 2023-04-03 DIAGNOSIS — I10 Essential (primary) hypertension: Secondary | ICD-10-CM

## 2023-04-03 MED ORDER — CHLORHEXIDINE GLUCONATE 0.12 % MT SOLN: OROMUCOSAL | 0 refills | Status: DC

## 2023-04-05 ENCOUNTER — Encounter: Payer: Federal, State, Local not specified - PPO | Admitting: Internal Medicine

## 2023-04-06 ENCOUNTER — Other Ambulatory Visit: Payer: Self-pay | Admitting: Internal Medicine

## 2023-04-17 ENCOUNTER — Telehealth: Payer: Self-pay

## 2023-04-17 NOTE — Telephone Encounter (Signed)
Appt made, patient aware

## 2023-04-17 NOTE — Progress Notes (Signed)
Virtual Visit via Video Note  I connected with Teresa Russell on 04/18/23 at  9:00 AM EST by a video enabled telemedicine application and verified that I am speaking with the correct person using two identifiers.  Patient Location: Home Provider Location: Office/Clinic  I discussed the limitations, risks, security, and privacy concerns of performing an evaluation and management service by video and the availability of in person appointments. I also discussed with the patient that there may be a patient responsible charge related to this service. The patient expressed understanding and agreed to proceed.  Subjective: PCP: Anabel Halon, MD  Chief Complaint  Patient presents with   Acute Visit    Stuffy nose, sore throat, cough, congestion/ yellow mucus    Teresa Russell 52 year old female presents via telehealth complains of cough. Patient describes symptoms of nasal congestion, headaches, cough, fatigue, malaise, myalgias, sore throat, and sputum production. Symptoms began 4 days ago and are unchanged since that time. Patient denies chest pain or nausea and vomiting. Treatment thus far includes OTC analgesics/antipyretics: effective and  nasal spray with moderate relief. Past pulmonary history is significant for pneumonia.      ROS: Per HPI  Current Outpatient Medications:    Azelastine-Fluticasone 137-50 MCG/ACT SUSP, Place 1 spray into the nose every 12 (twelve) hours., Disp: 23 g, Rfl: 1   B Complex-C (VITAMIN B + C COMPLEX) TABS, Take by mouth daily at 6 (six) AM. Plus Zinc, Disp: , Rfl:    chlorhexidine (PERIDEX) 0.12 % solution, Mix 1 capful with water to soak dentures overnight, Disp: 473 mL, Rfl: 0   clindamycin (CLEOCIN T) 1 % lotion, Apply topically., Disp: , Rfl:    HYDROcodone-acetaminophen (NORCO/VICODIN) 5-325 MG tablet, Take 1-2 tablets by mouth every 4 (four) hours as needed for moderate pain., Disp: 28 tablet, Rfl: 0   ibuprofen (ADVIL) 800 MG tablet, Take 1  tablet (800 mg total) by mouth every 8 (eight) hours as needed., Disp: 90 tablet, Rfl: 1   ipratropium (ATROVENT) 0.03 % nasal spray, USE 2 SPRAY(S) IN EACH NOSTRIL EVERY 12 HOURS, Disp: 30 mL, Rfl: 0   lidocaine (XYLOCAINE) 2 % solution, Use as directed 15 mLs in the mouth or throat every 6 (six) hours as needed (sore throat)., Disp: 100 mL, Rfl: 0   lisinopril-hydrochlorothiazide (ZESTORETIC) 10-12.5 MG tablet, Take 1 tablet by mouth once daily, Disp: 90 tablet, Rfl: 0   LYSINE ACETATE PO, Take by mouth daily at 6 (six) AM., Disp: , Rfl:    metFORMIN (GLUCOPHAGE-XR) 500 MG 24 hr tablet, Take 1 tablet by mouth once daily with breakfast, Disp: 90 tablet, Rfl: 0   promethazine-dextromethorphan (PROMETHAZINE-DM) 6.25-15 MG/5ML syrup, Take 5 mLs by mouth 4 (four) times daily as needed., Disp: 118 mL, Rfl: 0   rosuvastatin (CRESTOR) 10 MG tablet, Take 1 tablet by mouth once daily, Disp: 90 tablet, Rfl: 0   Simethicone (PHAZYME PO), Take 1 capsule by mouth as needed., Disp: , Rfl:    tirzepatide (MOUNJARO) 5 MG/0.5ML Pen, Inject 5 mg into the skin once a week., Disp: 6 mL, Rfl: 0   VITAMIN D PO, Take by mouth daily at 6 (six) AM., Disp: , Rfl:   Observations/Objective: Today's Vitals   04/18/23 0858  Weight: 203 lb (92.1 kg)  Height: 5' 5.5" (1.664 m)  PainSc: 0-No pain   Physical Exam Patient is alert and no acute distress noted.   Assessment and Plan: Viral illness Assessment & Plan: Azelastine-fluticasone nasal  spray ,promethazine syrup PRN Advise patient to rest to support your body's recovery. Stay hydrated by drinking water, tea, or broth. Using a humidifier can help soothe throat irritation and ease nasal congestion. For fever or pain, acetaminophen (Tylenol) is recommended. To relieve other symptoms, try saline nasal sprays, throat lozenges, or gargling with saltwater. Focus on eating light, healthy meals like fruits and vegetables to keep your strength up. Practice good hygiene by  washing your hands frequently and covering your mouth when coughing or sneezing.Follow-up for worsening or persistent symptoms. Patient verbalizes understanding regarding plan of care and all questions answered    Orders: -     Promethazine-DM; Take 5 mLs by mouth 4 (four) times daily as needed.  Dispense: 118 mL; Refill: 0 -     Azelastine-Fluticasone; Place 1 spray into the nose every 12 (twelve) hours.  Dispense: 23 g; Refill: 1  Other orders -     Lidocaine Viscous HCl; Use as directed 15 mLs in the mouth or throat every 6 (six) hours as needed (sore throat).  Dispense: 100 mL; Refill: 0    Follow Up Instructions: No follow-ups on file.   I discussed the assessment and treatment plan with the patient. The patient was provided an opportunity to ask questions, and all were answered. The patient agreed with the plan and demonstrated an understanding of the instructions.   The patient was advised to call back or seek an in-person evaluation if the symptoms worsen or if the condition fails to improve as anticipated.  The above assessment and management plan was discussed with the patient. The patient verbalized understanding of and has agreed to the management plan.   Cruzita Lederer Newman Nip, FNP

## 2023-04-17 NOTE — Telephone Encounter (Signed)
Copied from CRM [id number]. Topic: Clinical - Medical Advice >> Apr 17, 2023 12:23 PM Herbert Seta B wrote: Reason for CRM: Patient experiencing cough/nasal congestion/headache x3 days, would like to know if RX can be sent by PCP or what to take OTC

## 2023-04-18 ENCOUNTER — Telehealth (INDEPENDENT_AMBULATORY_CARE_PROVIDER_SITE_OTHER): Payer: Federal, State, Local not specified - PPO | Admitting: Family Medicine

## 2023-04-18 ENCOUNTER — Encounter: Payer: Self-pay | Admitting: Family Medicine

## 2023-04-18 VITALS — Ht 65.5 in | Wt 203.0 lb

## 2023-04-18 DIAGNOSIS — B349 Viral infection, unspecified: Secondary | ICD-10-CM | POA: Diagnosis not present

## 2023-04-18 MED ORDER — AZELASTINE-FLUTICASONE 137-50 MCG/ACT NA SUSP
1.0000 | Freq: Two times a day (BID) | NASAL | 1 refills | Status: DC
Start: 2023-04-18 — End: 2023-05-17

## 2023-04-18 MED ORDER — LIDOCAINE VISCOUS HCL 2 % MT SOLN: 15.0000 mL | Freq: Four times a day (QID) | OROMUCOSAL | 0 refills | Status: DC | PRN

## 2023-04-18 MED ORDER — PROMETHAZINE-DM 6.25-15 MG/5ML PO SYRP
5.0000 mL | ORAL_SOLUTION | Freq: Four times a day (QID) | ORAL | 0 refills | Status: DC | PRN
Start: 2023-04-18 — End: 2023-05-17

## 2023-04-18 NOTE — Assessment & Plan Note (Signed)
Azelastine-fluticasone nasal spray ,promethazine syrup PRN Advise patient to rest to support your body's recovery. Stay hydrated by drinking water, tea, or broth. Using a humidifier can help soothe throat irritation and ease nasal congestion. For fever or pain, acetaminophen (Tylenol) is recommended. To relieve other symptoms, try saline nasal sprays, throat lozenges, or gargling with saltwater. Focus on eating light, healthy meals like fruits and vegetables to keep your strength up. Practice good hygiene by washing your hands frequently and covering your mouth when coughing or sneezing.Follow-up for worsening or persistent symptoms. Patient verbalizes understanding regarding plan of care and all questions answered

## 2023-04-21 ENCOUNTER — Telehealth: Payer: Self-pay

## 2023-04-21 ENCOUNTER — Other Ambulatory Visit: Payer: Self-pay | Admitting: Family Medicine

## 2023-04-21 MED ORDER — AZITHROMYCIN 250 MG PO TABS: ORAL_TABLET | ORAL | 0 refills | Status: DC

## 2023-04-21 MED ORDER — BENZONATATE 200 MG PO CAPS: 200.0000 mg | ORAL_CAPSULE | Freq: Two times a day (BID) | ORAL | 0 refills | Status: DC | PRN

## 2023-04-21 NOTE — Telephone Encounter (Signed)
Copied from CRM [id number]. Topic: Clinical - Medical Advice >> Apr 21, 2023 10:26 AM Fonda Kinder J wrote: Reason for CRM: Pt had a My chart visit with Lliana on 12/17 and was told by her that if she didn't feel better within 3 days to give her a call so she could prescribe her an antibiotic. Pt called in today and says she is not feeling better and wants to request those antibiotics

## 2023-04-21 NOTE — Telephone Encounter (Signed)
Copied from CRM [id number]. Topic: Clinical - Medication Question >> Apr 21, 2023 10:28 AM Fonda Kinder J wrote: Reason for CRM: Pt was prescribed promethazine-dextromethorphan after her 12/17 visit and states the medicine makes her drowsy so she's unable to take it during the day and wants to know if there was a non drowsy medication that she could be prescribed instead

## 2023-05-03 ENCOUNTER — Encounter: Payer: Self-pay | Admitting: Internal Medicine

## 2023-05-04 ENCOUNTER — Other Ambulatory Visit: Payer: Self-pay | Admitting: Internal Medicine

## 2023-05-04 DIAGNOSIS — E1169 Type 2 diabetes mellitus with other specified complication: Secondary | ICD-10-CM

## 2023-05-04 MED ORDER — TIRZEPATIDE 7.5 MG/0.5ML ~~LOC~~ SOAJ
7.5000 mg | SUBCUTANEOUS | 0 refills | Status: DC
Start: 2023-05-04 — End: 2023-06-06

## 2023-05-08 ENCOUNTER — Encounter: Payer: Self-pay | Admitting: Internal Medicine

## 2023-05-15 ENCOUNTER — Other Ambulatory Visit: Payer: Self-pay | Admitting: Internal Medicine

## 2023-05-17 ENCOUNTER — Ambulatory Visit: Payer: BC Managed Care – PPO | Admitting: Internal Medicine

## 2023-05-17 ENCOUNTER — Encounter: Payer: Self-pay | Admitting: Internal Medicine

## 2023-05-17 VITALS — BP 138/86 | HR 88 | Ht 65.5 in | Wt 211.6 lb

## 2023-05-17 DIAGNOSIS — G8929 Other chronic pain: Secondary | ICD-10-CM | POA: Insufficient documentation

## 2023-05-17 DIAGNOSIS — M67432 Ganglion, left wrist: Secondary | ICD-10-CM | POA: Insufficient documentation

## 2023-05-17 DIAGNOSIS — E1169 Type 2 diabetes mellitus with other specified complication: Secondary | ICD-10-CM | POA: Diagnosis not present

## 2023-05-17 DIAGNOSIS — M25562 Pain in left knee: Secondary | ICD-10-CM

## 2023-05-17 DIAGNOSIS — Z7984 Long term (current) use of oral hypoglycemic drugs: Secondary | ICD-10-CM

## 2023-05-17 DIAGNOSIS — Z0001 Encounter for general adult medical examination with abnormal findings: Secondary | ICD-10-CM | POA: Diagnosis not present

## 2023-05-17 DIAGNOSIS — J301 Allergic rhinitis due to pollen: Secondary | ICD-10-CM

## 2023-05-17 DIAGNOSIS — E782 Mixed hyperlipidemia: Secondary | ICD-10-CM

## 2023-05-17 DIAGNOSIS — E559 Vitamin D deficiency, unspecified: Secondary | ICD-10-CM

## 2023-05-17 DIAGNOSIS — I10 Essential (primary) hypertension: Secondary | ICD-10-CM

## 2023-05-17 MED ORDER — IPRATROPIUM BROMIDE 0.03 % NA SOLN
2.0000 | Freq: Two times a day (BID) | NASAL | 1 refills | Status: AC
Start: 2023-05-17 — End: ?

## 2023-05-17 NOTE — Assessment & Plan Note (Addendum)
Lab Results  Component Value Date   HGBA1C 6.1 (H) 01/11/2023   Associated with HTN and HLD Well controlled On metformin 500 mg daily On Mounjaro 7.5 mg qw for type II DM and obesity, and increase dose as tolerated Advised to follow diabetic diet On statin and ACEi F/u CMP and lipid panel Diabetic eye exam: Advised to follow up with Ophthalmology for diabetic eye exam

## 2023-05-17 NOTE — Progress Notes (Signed)
Established Patient Office Visit  Subjective:  Patient ID: Teresa Russell, female    DOB: [birth year]  Age: 53 y.o. MRN: [id number]  CC:  Chief Complaint  Patient presents with   Annual Exam    Annual physical and 3 month f/u, reports back of her leg hurting when she bends it or squats, ongoing for 3 months.    Wrist Pain    Has a knot on her wrist ongoing for 2 months, no pain its just there.     HPI Teresa Russell is a 53 y.o. female with past medical history of HTN, DM, allergic rhinitis and obesity who presents for annual physical.  HTN: Her BP has been well-controlled at home.  She denies any chest pain, dyspnea or palpitations currently.   Type II DM: Her HbA1c was 6.1 in 09/24. She is currently taking metformin 500 mg QD and Mounjaro 7.5 mg qw.  She has not been able to lose weight despite taking Mounjaro lately.  Denies any polyuria or polyphagia currently.  She also takes Crestor for HLD.   She had orthopedic surgery evaluation for right hand carpal tunnel syndrome.  She had  carpal tunnel release surgery in 07/24 and has noted improvement in hand numbness. She has noticed a bump over left wrist for the last 2 months. Denies any local pain.  She also reports pain of left popliteal area, with bulging sensation since 02/15/23.  Her pain is intermittent, worse with extension and better with rest.  She has tried ibuprofen with mild relief.  Past Medical History:  Diagnosis Date   Arthritis    wrists and right hip, right knee   Diabetes mellitus without complication (HCC)    GERD (gastroesophageal reflux disease)    Hyperlipidemia    Hypertension    Pneumonia    Sleep apnea     Past Surgical History:  Procedure Laterality Date   ABDOMINAL HYSTERECTOMY  2010   for heavy bleeding   BIOPSY  09/21/2021   Procedure: BIOPSY;  Surgeon: Lanelle Bal, DO;  Location: [facility];  Service: Endoscopy;;   CARPAL TUNNEL RELEASE Right 11/08/2022   Procedure: CARPAL TUNNEL  RELEASE;  Surgeon: Vickki Hearing, MD;  Location: [facility];  Service: Orthopedics;  Laterality: Right;   COLONOSCOPY N/A 10/14/2014   Procedure: COLONOSCOPY;  Surgeon: West Bali, MD;  Location: [facility];  Service: Endoscopy;  Laterality: N/A;  115 - moved to 11:15 - office notified pt   COLONOSCOPY WITH PROPOFOL N/A 09/21/2021   Procedure: COLONOSCOPY WITH PROPOFOL;  Surgeon: Lanelle Bal, DO;  Location: [facility];  Service: Endoscopy;  Laterality: N/A;  9:30 / ASA 2   POLYPECTOMY  09/21/2021   Procedure: POLYPECTOMY;  Surgeon: Lanelle Bal, DO;  Location: [facility];  Service: Endoscopy;;    Family History  Problem Relation Age of Onset   Colon cancer Mother 29       passed away age 41   Breast cancer Maternal Aunt        currently treating    Social History   Socioeconomic History   Marital status: Single    Spouse name: Not on file   Number of children: Not on file   Years of education: Not on file   Highest education level: Not on file  Occupational History   Not on file  Tobacco Use   Smoking status: Former    Current packs/day: 0.00    Types: Cigarettes  Quit date: 05/02/2012    Years since quitting: 11.0   Smokeless tobacco: Never  Vaping Use   Vaping status: Never Used  Substance and Sexual Activity   Alcohol use: Not Currently    Comment: Last drink 2012; used to drink a case of beer every Friday   Drug use: No   Sexual activity: Yes  Other Topics Concern   Not on file  Social History Narrative   Not on file   Social Drivers of Health   Financial Resource Strain: Not on file  Food Insecurity: Not on file  Transportation Needs: Not on file  Physical Activity: Not on file  Stress: Not on file  Social Connections: Not on file  Intimate Partner Violence: Not on file    Outpatient Medications Prior to Visit  Medication Sig Dispense Refill   B Complex-C (VITAMIN B + C COMPLEX) TABS Take by mouth daily at 6 (six) AM. Plus Zinc      chlorhexidine (PERIDEX) 0.12 % solution Mix 1 capful with water to soak dentures overnight 473 mL 0   clindamycin (CLEOCIN T) 1 % lotion Apply topically.     HYDROcodone-acetaminophen (NORCO/VICODIN) 5-325 MG tablet Take 1-2 tablets by mouth every 4 (four) hours as needed for moderate pain. 28 tablet 0   ibuprofen (ADVIL) 800 MG tablet Take 1 tablet (800 mg total) by mouth every 8 (eight) hours as needed. 90 tablet 1   lisinopril-hydrochlorothiazide (ZESTORETIC) 10-12.5 MG tablet Take 1 tablet by mouth once daily 90 tablet 0   LYSINE ACETATE PO Take by mouth daily at 6 (six) AM.     metFORMIN (GLUCOPHAGE-XR) 500 MG 24 hr tablet Take 1 tablet by mouth once daily with breakfast 90 tablet 0   rosuvastatin (CRESTOR) 10 MG tablet Take 1 tablet by mouth once daily 90 tablet 0   Simethicone (PHAZYME PO) Take 1 capsule by mouth as needed.     tirzepatide (MOUNJARO) 7.5 MG/0.5ML Pen Inject 7.5 mg into the skin once a week. 6 mL 0   VITAMIN D PO Take by mouth daily at 6 (six) AM.     Azelastine-Fluticasone 137-50 MCG/ACT SUSP Place 1 spray into the nose every 12 (twelve) hours. 23 g 1   azithromycin (ZITHROMAX) 250 MG tablet Take 2 tablets on day 1, then 1 tablet daily on days 2 through 5 6 tablet 0   benzonatate (TESSALON) 200 MG capsule Take 1 capsule (200 mg total) by mouth 2 (two) times daily as needed for cough. 20 capsule 0   ipratropium (ATROVENT) 0.03 % nasal spray USE 2 SPRAY(S) IN EACH NOSTRIL EVERY 12 HOURS 30 mL 0   lidocaine (XYLOCAINE) 2 % solution Use as directed 15 mLs in the mouth or throat every 6 (six) hours as needed (sore throat). 100 mL 0   promethazine-dextromethorphan (PROMETHAZINE-DM) 6.25-15 MG/5ML syrup Take 5 mLs by mouth 4 (four) times daily as needed. 118 mL 0   No facility-administered medications prior to visit.    Allergies  Allergen Reactions   Wellbutrin [Bupropion] Other (See Comments)    Spinal pain    ROS Review of Systems  Constitutional:  Positive for  fatigue. Negative for chills and fever.  HENT:  Negative for congestion, postnasal drip, sinus pressure and sore throat.   Eyes:  Negative for pain and discharge.  Respiratory:  Negative for cough and shortness of breath.   Cardiovascular:  Negative for chest pain and palpitations.  Gastrointestinal:  Negative for diarrhea, nausea and vomiting.  Endocrine:  Negative for polydipsia and polyuria.  Genitourinary:  Negative for dysuria, flank pain, hematuria, vaginal discharge and vaginal pain.  Musculoskeletal:  Negative for neck pain and neck stiffness.       Left knee and leg pain  Skin:  Positive for rash.       Cyst over upper back  Neurological:  Negative for dizziness.  Psychiatric/Behavioral:  Negative for agitation and behavioral problems.       Objective:    Physical Exam Vitals reviewed.  Constitutional:      General: She is not in acute distress.    Appearance: She is not diaphoretic.  HENT:     Head: Normocephalic and atraumatic.     Nose: Congestion present.     Comments: DNS    Mouth/Throat:     Mouth: Mucous membranes are dry.  Eyes:     General: No scleral icterus.    Extraocular Movements: Extraocular movements intact.  Cardiovascular:     Rate and Rhythm: Normal rate and regular rhythm.     Heart sounds: Normal heart sounds. No murmur heard. Pulmonary:     Breath sounds: Normal breath sounds. No wheezing or rales.  Abdominal:     General: Bowel sounds are normal.     Palpations: Abdomen is soft.     Hernia: A hernia (Umbilical) is present.  Musculoskeletal:     Cervical back: Neck supple. No tenderness.     Right lower leg: No edema.     Left lower leg: No edema.  Skin:    General: Skin is warm.     Findings: Rash (Whitish patch over extensor surface of elbow region) present.     Comments: Epidermoid cyst over upper back, about 2 cm in diameter, no discharge or bleeding noted Cyst-like structure over dorsum of left hand - about 2 cm in  diameter Hyperpigmentation in axillary and neck area -likely acanthosis nigricans  Neurological:     General: No focal deficit present.     Mental Status: She is alert and oriented to person, place, and time.     Sensory: No sensory deficit.     Motor: No weakness.  Psychiatric:        Mood and Affect: Mood normal.        Behavior: Behavior normal.     BP 138/86 (BP Location: Left Arm)   Pulse 88   Ht 5' 5.5" (1.664 m)   Wt 211 lb 9.6 oz (96 kg)   SpO2 98%   BMI 34.68 kg/m  Wt Readings from Last 3 Encounters:  05/17/23 211 lb 9.6 oz (96 kg)  04/18/23 203 lb (92.1 kg)  01/11/23 202 lb (91.6 kg)    Lab Results  Component Value Date   TSH 1.070 03/30/2022   Lab Results  Component Value Date   WBC 6.4 11/02/2022   HGB 12.5 11/02/2022   HCT 36.4 11/02/2022   MCV 85.8 11/02/2022   PLT 315 11/02/2022   Lab Results  Component Value Date   NA 141 01/11/2023   K 4.1 01/11/2023   CO2 21 01/11/2023   GLUCOSE 128 (H) 01/11/2023   BUN 10 01/11/2023   CREATININE 0.55 (L) 01/11/2023   BILITOT 0.4 01/11/2023   ALKPHOS 81 01/11/2023   AST 39 01/11/2023   ALT 48 (H) 01/11/2023   PROT 6.9 01/11/2023   ALBUMIN 4.6 01/11/2023   CALCIUM 9.9 01/11/2023   ANIONGAP 10 11/02/2022   EGFR 111 01/11/2023   Lab Results  Component Value Date  CHOL 178 01/11/2023   Lab Results  Component Value Date   HDL 40 01/11/2023   Lab Results  Component Value Date   LDLCALC 73 01/11/2023   Lab Results  Component Value Date   TRIG 408 (H) 01/11/2023   Lab Results  Component Value Date   CHOLHDL 4.5 (H) 01/11/2023   Lab Results  Component Value Date   HGBA1C 6.1 (H) 01/11/2023      Assessment & Plan:   Problem List Items Addressed This Visit       Cardiovascular and Mediastinum   Hypertension   BP Readings from Last 1 Encounters:  05/17/23 138/86   Well-controlled with Lisinopril-HCTZ Counseled for compliance with the medications Advised DASH diet and moderate  exercise/walking, at least 150 mins/week      Relevant Orders   TSH   CMP14+EGFR   CBC with Differential/Platelet     Respiratory   Allergic rhinitis   Refilled Atrovent nasal spray Continue OTC antihistaminic      Relevant Medications   ipratropium (ATROVENT) 0.03 % nasal spray     Endocrine   DM (diabetes mellitus) (HCC)   Lab Results  Component Value Date   HGBA1C 6.1 (H) 01/11/2023   Associated with HTN and HLD Well controlled On metformin 500 mg daily On Mounjaro 7.5 mg qw for type II DM and obesity, and increase dose as tolerated Advised to follow diabetic diet On statin and ACEi F/u CMP and lipid panel Diabetic eye exam: Advised to follow up with Ophthalmology for diabetic eye exam      Relevant Orders   Hemoglobin A1c   CMP14+EGFR   Urine Microalbumin w/creat. ratio     Other   Encounter for general adult medical examination with abnormal findings - Primary   Annual exam as documented. Counseling done  re healthy lifestyle involving commitment to 150 minutes exercise per week, heart healthy diet, and attaining healthy weight.The importance of adequate sleep also discussed. Immunization and cancer screening needs are specifically addressed at this visit.      Mixed hyperlipidemia   On Crestor Check lipid profile      Relevant Orders   Lipid panel   Ganglion cyst of dorsum of left wrist   Cystic structure over left wrist area likely ganglion cyst - reassured it being benign If increase in size, will refer to hand surgery       Chronic pain of left knee   Left knee pain with popliteal area bulging could be due to Baker's cyst Advised to perform leg elevation, rest and ice application If persistent or worse, will need orthopedic surgery evaluation      Other Visit Diagnoses       Vitamin D deficiency       Relevant Orders   VITAMIN D 25 Hydroxy (Vit-D Deficiency, Fractures)       Meds ordered this encounter  Medications   ipratropium  (ATROVENT) 0.03 % nasal spray    Sig: Place 2 sprays into the nose 2 (two) times daily.    Dispense:  30 mL    Refill:  1    Follow-up: Return in about 4 months (around 09/14/2023) for DM and HTN.    Anabel Halon, MD

## 2023-05-17 NOTE — Patient Instructions (Addendum)
 Please schedule a Diabetic Eye Exam for 05/22/2023.  Please continue to take medications as prescribed.  Please continue to follow low carb diet and perform moderate exercise/walking at least 150 mins/week.  Please get fasting blood tests done before the next visit.

## 2023-05-17 NOTE — Assessment & Plan Note (Signed)
 Annual exam as documented. Counseling done  re healthy lifestyle involving commitment to 150 minutes exercise per week, heart healthy diet, and attaining healthy weight.The importance of adequate sleep also discussed.  Immunization and cancer screening needs are specifically addressed at this visit.

## 2023-05-18 NOTE — Assessment & Plan Note (Signed)
On Crestor Check lipid profile 

## 2023-05-18 NOTE — Assessment & Plan Note (Signed)
BP Readings from Last 1 Encounters:  05/17/23 138/86   Well-controlled with Lisinopril-HCTZ Counseled for compliance with the medications Advised DASH diet and moderate exercise/walking, at least 150 mins/week

## 2023-05-18 NOTE — Assessment & Plan Note (Addendum)
Refilled Atrovent nasal spray Continue OTC antihistaminic

## 2023-05-18 NOTE — Assessment & Plan Note (Addendum)
Left knee pain with popliteal area bulging could be due to Baker's cyst Advised to perform leg elevation, rest and ice application If persistent or worse, will need orthopedic surgery evaluation

## 2023-05-18 NOTE — Assessment & Plan Note (Signed)
Cystic structure over left wrist area likely ganglion cyst - reassured it being benign If increase in size, will refer to hand surgery

## 2023-05-19 LAB — MICROALBUMIN / CREATININE URINE RATIO
Creatinine, Urine: 220.4 mg/dL
Microalb/Creat Ratio: 11 mg/g{creat} (ref 0–29)
Microalbumin, Urine: 23.4 ug/mL

## 2023-05-22 ENCOUNTER — Ambulatory Visit (INDEPENDENT_AMBULATORY_CARE_PROVIDER_SITE_OTHER): Payer: BC Managed Care – PPO

## 2023-05-22 DIAGNOSIS — E1169 Type 2 diabetes mellitus with other specified complication: Secondary | ICD-10-CM | POA: Diagnosis not present

## 2023-05-22 LAB — HM DIABETES EYE EXAM

## 2023-05-22 NOTE — Progress Notes (Signed)
Teresa Russell arrived 05/22/2023 and has given verbal consent to obtain images and complete their overdue diabetic retinal screening.  The images have been sent to an ophthalmologist or optometrist for review and interpretation.  Results will be sent back to Anabel Halon, MD for review.  Patient has been informed they will be contacted when we receive the results via telephone or MyChart

## 2023-06-05 ENCOUNTER — Encounter: Payer: Self-pay | Admitting: Internal Medicine

## 2023-06-06 ENCOUNTER — Other Ambulatory Visit: Payer: Self-pay | Admitting: Internal Medicine

## 2023-06-06 DIAGNOSIS — E1169 Type 2 diabetes mellitus with other specified complication: Secondary | ICD-10-CM

## 2023-06-06 MED ORDER — TIRZEPATIDE 10 MG/0.5ML ~~LOC~~ SOAJ
10.0000 mg | SUBCUTANEOUS | 1 refills | Status: DC
Start: 2023-06-06 — End: 2023-09-06

## 2023-06-07 DIAGNOSIS — R519 Headache, unspecified: Secondary | ICD-10-CM | POA: Diagnosis not present

## 2023-06-07 DIAGNOSIS — Z20822 Contact with and (suspected) exposure to covid-19: Secondary | ICD-10-CM | POA: Diagnosis not present

## 2023-06-07 DIAGNOSIS — U071 COVID-19: Secondary | ICD-10-CM | POA: Diagnosis not present

## 2023-06-12 ENCOUNTER — Ambulatory Visit: Payer: Self-pay | Admitting: Internal Medicine

## 2023-06-12 NOTE — Telephone Encounter (Signed)
  Chief Complaint: covid +, HA, pelvic pain Symptoms: HA, pelvic pain Frequency: 6 days Pertinent Negatives: Patient denies fever at this time, SOB, CP,   Disposition: [] ED /[] Urgent Care (no appt availability in office) / [x] Appointment(In office/virtual)/ []  [facility]/ [] Home Care/ [] Refused Recommended Disposition /[] [facility]/ []  Follow-up with PCP  Additional Notes: Pt states she was dx with covid last Thursday at [facility]. Pt states that she has never been able to get rid of the HA. Pt states she is also now dealing with pelvic pain, this is relieved by sitting. Pt sched tomorrow with another provider in her PCP clinic.  Copied from CRM [id number]. Topic: Clinical - Red Word Triage >> Jun 12, 2023  1:43 PM Antwanette L wrote: Red Word that prompted transfer to Nurse Triage: Patient tested positive for covid last Wednesday. Patient is having headaches that she cannot get rid of. Also, patient is having severe pain in abdominal and pelvic area Reason for Disposition . [1] COVID-19 diagnosed by doctor (or NP/PA) AND [2] mild symptoms (e.g., cough, fever, others) AND [3] no complications or SOB  Answer Assessment - Initial Assessment Questions 1. COVID-19 DIAGNOSIS: "How do you know that you have COVID?" (e.g., positive lab test or self-test, diagnosed by doctor or NP/PA, symptoms after exposure).     UC  3. ONSET: "When did the COVID-19 symptoms start?"      6 days ago 4. WORST SYMPTOM: "What is your worst symptom?" (e.g., cough, fever, shortness of breath, muscle aches)     Sitting down pelvic pain is tolerable. HA is always there, so HA 5. COUGH: "Do you have a cough?" If Yes, ask: "How bad is the cough?"       Yes cough, sometimes, cough syrup is helping 6. FEVER: "Do you have a fever?" If Yes, ask: "What is your temperature, how was it measured, and when did it start?"     Highest 102, yesterday 7. RESPIRATORY STATUS: "Describe your breathing?" (e.g., normal;  shortness of breath, wheezing, unable to speak)      denies 8. BETTER-SAME-WORSE: "Are you getting better, staying the same or getting worse compared to yesterday?"  If getting worse, ask, "In what way?"     Some days I feel I am getting better, then I get worse, I feel the same.  9. OTHER SYMPTOMS: "Do you have any other symptoms?"  (e.g., chills, fatigue, headache, loss of smell or taste, muscle pain, sore throat)     Pelvic pain, HA, fatigue, sore throat,  10. HIGH RISK DISEASE: "Do you have any chronic medical problems?" (e.g., asthma, heart or lung disease, weak immune system, obesity, etc.)       denies 11. VACCINE: "Have you had the COVID-19 vaccine?" If Yes, ask: "Which one, how many shots, when did you get it?"       denies 12. PREGNANCY: "Is there any chance you are pregnant?" "When was your last menstrual period?"       denies 78. O2 SATURATION MONITOR:  "Do you use an oxygen saturation monitor (pulse oximeter) at home?" If Yes, ask "What is your reading (oxygen level) today?" "What is your usual oxygen saturation reading?" (e.g., 95%)       Does not have equip  Protocols used: Coronavirus (COVID-19) Diagnosed or Suspected-A-AH

## 2023-06-13 ENCOUNTER — Encounter: Payer: Self-pay | Admitting: Internal Medicine

## 2023-06-13 ENCOUNTER — Telehealth (INDEPENDENT_AMBULATORY_CARE_PROVIDER_SITE_OTHER): Payer: Self-pay | Admitting: Internal Medicine

## 2023-06-13 DIAGNOSIS — U071 COVID-19: Secondary | ICD-10-CM | POA: Insufficient documentation

## 2023-06-13 DIAGNOSIS — R102 Pelvic and perineal pain: Secondary | ICD-10-CM | POA: Diagnosis not present

## 2023-06-13 NOTE — Assessment & Plan Note (Signed)
Evaluated today for an acute visit through video encounter in the setting of COVID-19.  Symptom onset 2/4.  COVID-19 positive 2/5.  She states that symptoms are improving but she is experiencing a lingering headache and left-sided pelvic pain.  She also took a colon cleanse on Saturday (2/9).  She has attempted to manage her symptoms with OTC cough/cold medication, Tylenol, and ibuprofen. -Treatment options reviewed.  She is out of window for Paxlovid.  Overall she feels the symptoms are gradually improving.  I believe her current symptoms are partially attributable to dehydration given that she has experienced diarrhea and also completed a colon cleanse over the weekend.  I recommended continued supportive care measures for now and aggressive hydration.

## 2023-06-13 NOTE — Progress Notes (Signed)
Virtual Visit via Video Note  I connected with Teresa Russell on 06/13/23 at  8:20 AM EST by a video enabled telemedicine application and verified that I am speaking with the correct person using two identifiers.  Patient Location: Home Provider Location: Office/Clinic  I discussed the limitations, risks, security, and privacy concerns of performing an evaluation and management service by video and the availability of in person appointments. I also discussed with the patient that there may be a patient responsible charge related to this service. The patient expressed understanding and agreed to proceed.  Subjective: PCP: Anabel Halon, MD  Chief Complaint  Patient presents with   Covid Positive   Pelvic Pain   Teresa Russell has been evaluated today for an acute visit through virtual encounter in the setting of COVID-19 and left pelvic pain.  She tested positive for COVID last Wednesday (2/5), symptom onset 2/4.  She currently endorses a headache and left sided pelvic pain.  She has previously experienced nonproductive cough, fever/chills with Tmax 102 F on Sunday (2/9), dry mouth, sore throat, fatigue, and diarrhea.  She describes left lower pelvic pain rated 6/10.  Pain is alleviated by sitting and worse with laying down flat.  Denies vaginal discharge.  She says there is no chance that she is pregnant.  Of note, she completed a colon cleanse on Saturday (2/8).  Teresa Russell has attempted to manage her symptoms with OTC cough/cold medication, ibuprofen, Tylenol, and leftover hydrocodone from surgery last fall.  ROS: Per HPI  Current Outpatient Medications:    B Complex-C (VITAMIN B + C COMPLEX) TABS, Take by mouth daily at 6 (six) AM. Plus Zinc, Disp: , Rfl:    chlorhexidine (PERIDEX) 0.12 % solution, Mix 1 capful with water to soak dentures overnight, Disp: 473 mL, Rfl: 0   clindamycin (CLEOCIN T) 1 % lotion, Apply topically., Disp: , Rfl:    HYDROcodone-acetaminophen (NORCO/VICODIN)  5-325 MG tablet, Take 1-2 tablets by mouth every 4 (four) hours as needed for moderate pain., Disp: 28 tablet, Rfl: 0   ibuprofen (ADVIL) 800 MG tablet, Take 1 tablet (800 mg total) by mouth every 8 (eight) hours as needed., Disp: 90 tablet, Rfl: 1   ipratropium (ATROVENT) 0.03 % nasal spray, Place 2 sprays into the nose 2 (two) times daily., Disp: 30 mL, Rfl: 1   lisinopril-hydrochlorothiazide (ZESTORETIC) 10-12.5 MG tablet, Take 1 tablet by mouth once daily, Disp: 90 tablet, Rfl: 0   LYSINE ACETATE PO, Take by mouth daily at 6 (six) AM., Disp: , Rfl:    metFORMIN (GLUCOPHAGE-XR) 500 MG 24 hr tablet, Take 1 tablet by mouth once daily with breakfast, Disp: 90 tablet, Rfl: 0   rosuvastatin (CRESTOR) 10 MG tablet, Take 1 tablet by mouth once daily, Disp: 90 tablet, Rfl: 0   Simethicone (PHAZYME PO), Take 1 capsule by mouth as needed., Disp: , Rfl:    tirzepatide (MOUNJARO) 10 MG/0.5ML Pen, Inject 10 mg into the skin once a week., Disp: 6 mL, Rfl: 1   VITAMIN D PO, Take by mouth daily at 6 (six) AM., Disp: , Rfl:   Observations/Objective:  Physical Exam Constitutional:      General: She is not in acute distress.    Appearance: Normal appearance. She is not toxic-appearing.  Neurological:     Mental Status: She is alert.    Assessment and Plan:  COVID-19 Assessment & Plan: Evaluated today for an acute visit through video encounter in the setting of COVID-19.  Symptom  onset 2/4.  COVID-19 positive 2/5.  She states that symptoms are improving but she is experiencing a lingering headache and left-sided pelvic pain.  She also took a colon cleanse on Saturday (2/9).  She has attempted to manage her symptoms with OTC cough/cold medication, Tylenol, and ibuprofen. -Treatment options reviewed.  She is out of window for Paxlovid.  Overall she feels the symptoms are gradually improving.  I believe her current symptoms are partially attributable to dehydration given that she has experienced diarrhea and  also completed a colon cleanse over the weekend.  I recommended continued supportive care measures for now and aggressive hydration.  Pelvic pain Assessment & Plan: Her additional concern today is left-sided pelvic pain.  She is COVID-19 positive and has experienced diarrhea.  She also completed a colon cleanse on Saturday (2/8).  Symptoms are alleviated by sitting down but worse with laying flat.  Denies vaginal discharge and blood in her stool.  Pain is currently rated 6/10.  She says there is no chance that she is pregnant. -Symptoms likely attributable to COVID-19 and recent colon cleansing.  I recommended continuing supportive care measures for now but having a low threshold for presenting to the emergency department if pain worsens.  Follow Up Instructions: Return if symptoms worsen or fail to improve.   I discussed the assessment and treatment plan with the patient. The patient was provided an opportunity to ask questions, and all were answered. The patient agreed with the plan and demonstrated an understanding of the instructions.   The patient was advised to call back or seek an in-person evaluation if the symptoms worsen or if the condition fails to improve as anticipated.  The above assessment and management plan was discussed with the patient. The patient verbalized understanding of and has agreed to the management plan.   Teresa Lade, MD

## 2023-06-13 NOTE — Assessment & Plan Note (Signed)
Her additional concern today is left-sided pelvic pain.  She is COVID-19 positive and has experienced diarrhea.  She also completed a colon cleanse on Saturday (2/8).  Symptoms are alleviated by sitting down but worse with laying flat.  Denies vaginal discharge and blood in her stool.  Pain is currently rated 6/10.  She says there is no chance that she is pregnant. -Symptoms likely attributable to COVID-19 and recent colon cleansing.  I recommended continuing supportive care measures for now but having a low threshold for presenting to the emergency department if pain worsens.

## 2023-06-27 ENCOUNTER — Other Ambulatory Visit: Payer: Self-pay | Admitting: Internal Medicine

## 2023-06-27 DIAGNOSIS — K1379 Other lesions of oral mucosa: Secondary | ICD-10-CM

## 2023-06-27 MED ORDER — CHLORHEXIDINE GLUCONATE 0.12 % MT SOLN
OROMUCOSAL | 0 refills | Status: DC
Start: 2023-06-27 — End: 2023-10-02

## 2023-07-05 ENCOUNTER — Encounter: Payer: Self-pay | Admitting: Internal Medicine

## 2023-07-05 ENCOUNTER — Other Ambulatory Visit: Payer: Self-pay | Admitting: Internal Medicine

## 2023-07-15 ENCOUNTER — Other Ambulatory Visit: Payer: Self-pay | Admitting: Orthopedic Surgery

## 2023-07-16 ENCOUNTER — Other Ambulatory Visit: Payer: Self-pay | Admitting: Internal Medicine

## 2023-07-16 DIAGNOSIS — I10 Essential (primary) hypertension: Secondary | ICD-10-CM

## 2023-07-16 DIAGNOSIS — E782 Mixed hyperlipidemia: Secondary | ICD-10-CM

## 2023-07-17 MED ORDER — LISINOPRIL-HYDROCHLOROTHIAZIDE 10-12.5 MG PO TABS: 1.0000 | ORAL_TABLET | Freq: Every day | ORAL | 0 refills | Status: DC

## 2023-07-17 MED ORDER — ROSUVASTATIN CALCIUM 10 MG PO TABS: 10.0000 mg | ORAL_TABLET | Freq: Every day | ORAL | 0 refills | Status: DC

## 2023-07-24 ENCOUNTER — Telehealth: Payer: Self-pay | Admitting: Internal Medicine

## 2023-07-24 ENCOUNTER — Other Ambulatory Visit: Payer: Self-pay | Admitting: Internal Medicine

## 2023-07-24 DIAGNOSIS — G8929 Other chronic pain: Secondary | ICD-10-CM

## 2023-07-24 NOTE — Telephone Encounter (Signed)
 Please Review

## 2023-07-24 NOTE — Telephone Encounter (Signed)
 Copied from CRM [id number]. Topic: Referral - Request for Referral >> Jul 24, 2023 10:20 AM Franchot Heidelberg wrote: Did the patient discuss referral with their provider in the last year? Yes (If No - schedule appointment) (If Yes - send message)  Appointment offered? No  Type of order/referral and detailed reason for visit: Ortho   Preference of office, provider, location: Dr. Fuller [place] (Surgeon)  If referral order, have you been seen by this specialty before? Yes (If Yes, this issue or another issue? When? Where?  Can we respond through MyChart? Yes

## 2023-08-02 ENCOUNTER — Telehealth: Payer: Self-pay | Admitting: Internal Medicine

## 2023-08-02 NOTE — Telephone Encounter (Signed)
 FMLA  Noted Copied Sleeved (put in provider box)  Call patient to pick up a copy no fax # given

## 2023-08-07 ENCOUNTER — Other Ambulatory Visit (INDEPENDENT_AMBULATORY_CARE_PROVIDER_SITE_OTHER): Payer: Self-pay

## 2023-08-07 ENCOUNTER — Ambulatory Visit (INDEPENDENT_AMBULATORY_CARE_PROVIDER_SITE_OTHER): Admitting: Orthopedic Surgery

## 2023-08-07 ENCOUNTER — Encounter: Payer: Self-pay | Admitting: Orthopedic Surgery

## 2023-08-07 VITALS — BP 140/98 | HR 89 | Ht 65.5 in | Wt 206.0 lb

## 2023-08-07 DIAGNOSIS — M25562 Pain in left knee: Secondary | ICD-10-CM

## 2023-08-07 DIAGNOSIS — G8929 Other chronic pain: Secondary | ICD-10-CM

## 2023-08-07 DIAGNOSIS — M5410 Radiculopathy, site unspecified: Secondary | ICD-10-CM | POA: Diagnosis not present

## 2023-08-07 MED ORDER — GABAPENTIN 100 MG PO CAPS
100.0000 mg | ORAL_CAPSULE | Freq: Three times a day (TID) | ORAL | 2 refills | Status: DC
Start: 2023-08-07 — End: 2023-11-06

## 2023-08-07 MED ORDER — PREDNISONE 10 MG PO TABS: 10.0000 mg | ORAL_TABLET | Freq: Three times a day (TID) | ORAL | 0 refills | Status: DC

## 2023-08-07 NOTE — Progress Notes (Signed)
 Chief Complaint  Patient presents with   Knee Pain    Left for 6-7 months no injury    53 year old female posterior pain for 6 to 7 months no history of trauma  Pain is worse when she is sitting in the car or lying down at night  Pain is relieved by putting her leg on the dashboard  She did take Advil and Tylenol with no relief of symptoms worsening   Physical Exam Vitals and nursing note reviewed.  Constitutional:      Appearance: Normal appearance.  HENT:     Head: Normocephalic and atraumatic.  Eyes:     General: No scleral icterus.       Right eye: No discharge.        Left eye: No discharge.     Extraocular Movements: Extraocular movements intact.     Conjunctiva/sclera: Conjunctivae normal.     Pupils: Pupils are equal, round, and reactive to light.  Cardiovascular:     Rate and Rhythm: Normal rate.     Pulses: Normal pulses.  Skin:    General: Skin is warm and dry.     Capillary Refill: Capillary refill takes less than 2 seconds.  Neurological:     General: No focal deficit present.     Mental Status: She is alert and oriented to person, place, and time.  Psychiatric:        Mood and Affect: Mood normal.        Behavior: Behavior normal.        Thought Content: Thought content normal.        Judgment: Judgment normal.   DG Knee AP/LAT W/Sunrise Left Result Date: 08/07/2023 AP and lateral and sunrise view of the left knee to evaluate pain in the back of the knee.  Overall alignment looks good no effusion no degenerative changes no arthritis Impression normal left knee   Encounter Diagnoses  Name Primary?   Chronic pain of left knee    Radicular leg pain Yes   Meds ordered this encounter  Medications   gabapentin (NEURONTIN) 100 MG capsule    Sig: Take 1 capsule (100 mg total) by mouth 3 (three) times daily.    Dispense:  90 capsule    Refill:  2   predniSONE (DELTASONE) 10 MG tablet    Sig: Take 1 tablet (10 mg total) by mouth 3 (three) times daily.     Dispense:  42 tablet    Refill:  67     53 year old female seems to have some pain in the popliteal fossa which may be neurogenic and lumbar generated  Recommend a course of anti-inflammatories and gabapentin  Recommend 6-week course

## 2023-08-07 NOTE — Progress Notes (Signed)
  Intake history:  BP (!) 140/98   Pulse 89   Ht 5' 5.5" (1.664 m)   Wt 206 lb (93.4 kg)   BMI 33.76 kg/m  Body mass index is 33.76 kg/m.    WHAT ARE WE SEEING YOU FOR TODAY?   left knee(s)  How long has this bothered you? (DOI?DOS?WS?) For 6- 7 months   Anticoag.  No  Diabetes Yes  Heart disease No  Hypertension Yes  SMOKING HX No  Kidney disease No     Latest Ref Rng & Units 01/11/2023    9:33 AM 11/02/2022    3:08 PM 09/27/2022    9:30 AM  CMP  Glucose 70 - 99 mg/dL 409  811  99   BUN 6 - 24 mg/dL 10  12  12    Creatinine 0.57 - 1.00 mg/dL 9.14  7.82  9.56   Sodium 134 - 144 mmol/L 141  139  138   Potassium 3.5 - 5.2 mmol/L 4.1  3.3  4.1   Chloride 96 - 106 mmol/L 104  105  101   CO2 20 - 29 mmol/L 21  24  17    Calcium 8.7 - 10.2 mg/dL 9.9  9.2  9.1   Total Protein 6.0 - 8.5 g/dL 6.9     Total Bilirubin 0.0 - 1.2 mg/dL 0.4     Alkaline Phos 44 - 121 IU/L 81     AST 0 - 40 IU/L 39     ALT 0 - 32 IU/L 48        Any ALLERGIES ______________________________________________   Treatment:  Have you taken:  Tylenol Yes  Advil Yes  Had PT No  Had injection No  Other  _________________________

## 2023-08-22 ENCOUNTER — Other Ambulatory Visit: Payer: Self-pay | Admitting: Internal Medicine

## 2023-08-30 ENCOUNTER — Encounter: Payer: Self-pay | Admitting: Orthopedic Surgery

## 2023-08-31 ENCOUNTER — Other Ambulatory Visit: Payer: Self-pay | Admitting: Orthopedic Surgery

## 2023-08-31 DIAGNOSIS — M5410 Radiculopathy, site unspecified: Secondary | ICD-10-CM

## 2023-08-31 MED ORDER — PREDNISONE 10 MG PO TABS: 10.0000 mg | ORAL_TABLET | Freq: Three times a day (TID) | ORAL | 0 refills | Status: DC

## 2023-09-06 ENCOUNTER — Ambulatory Visit: Payer: Self-pay | Admitting: Internal Medicine

## 2023-09-06 ENCOUNTER — Encounter: Payer: Self-pay | Admitting: Internal Medicine

## 2023-09-06 VITALS — BP 123/85 | HR 79 | Ht 65.5 in | Wt 201.0 lb

## 2023-09-06 DIAGNOSIS — E538 Deficiency of other specified B group vitamins: Secondary | ICD-10-CM

## 2023-09-06 DIAGNOSIS — E782 Mixed hyperlipidemia: Secondary | ICD-10-CM | POA: Diagnosis not present

## 2023-09-06 DIAGNOSIS — Z7984 Long term (current) use of oral hypoglycemic drugs: Secondary | ICD-10-CM

## 2023-09-06 DIAGNOSIS — M25562 Pain in left knee: Secondary | ICD-10-CM

## 2023-09-06 DIAGNOSIS — R5382 Chronic fatigue, unspecified: Secondary | ICD-10-CM | POA: Insufficient documentation

## 2023-09-06 DIAGNOSIS — E1169 Type 2 diabetes mellitus with other specified complication: Secondary | ICD-10-CM

## 2023-09-06 DIAGNOSIS — I10 Essential (primary) hypertension: Secondary | ICD-10-CM | POA: Diagnosis not present

## 2023-09-06 DIAGNOSIS — E559 Vitamin D deficiency, unspecified: Secondary | ICD-10-CM | POA: Diagnosis not present

## 2023-09-06 DIAGNOSIS — N951 Menopausal and female climacteric states: Secondary | ICD-10-CM

## 2023-09-06 DIAGNOSIS — E1159 Type 2 diabetes mellitus with other circulatory complications: Secondary | ICD-10-CM

## 2023-09-06 DIAGNOSIS — G8929 Other chronic pain: Secondary | ICD-10-CM

## 2023-09-06 MED ORDER — TIRZEPATIDE 12.5 MG/0.5ML ~~LOC~~ SOAJ: 12.5000 mg | SUBCUTANEOUS | 0 refills | Status: DC

## 2023-09-06 NOTE — Assessment & Plan Note (Addendum)
 Lab Results  Component Value Date   HGBA1C 6.1 (H) 01/11/2023   Associated with HTN and HLD Well controlled On metformin  500 mg daily On Mounjaro  10 mg qw for type II DM and obesity, and increase dose as tolerated, 12.5 mg qw after completing 10 mg supplies Advised to follow diabetic diet On statin and ACEi F/u CMP and lipid panel Diabetic eye exam: Advised to follow up with Ophthalmology for diabetic eye exam

## 2023-09-06 NOTE — Assessment & Plan Note (Addendum)
 BP Readings from Last 1 Encounters:  09/06/23 123/85   Well-controlled with Lisinopril -HCTZ Counseled for compliance with the medications Advised DASH diet and moderate exercise/walking, at least 150 mins/week

## 2023-09-06 NOTE — Assessment & Plan Note (Signed)
 Has chronic fatigue Check vitamin B12 levels

## 2023-09-06 NOTE — Patient Instructions (Addendum)
 Please start taking Mounjaro  12.5 mg dose after completing 10 mg.  Please continue to take medications as prescribed.  Please continue to follow low carb diet and perform moderate exercise/walking at least 150 mins/week.

## 2023-09-06 NOTE — Progress Notes (Signed)
 Established Patient Office Visit  Subjective:  Patient ID: Teresa Russell, female    DOB: 1971/01/08  Age: 53 y.o. MRN: [id number]  CC:  Chief Complaint  Patient presents with   Medical Management of Chronic Issues    4 month f/u , needs labs for infusion shots.    HPI Teresa Russell is a 53 y.o. female with past medical history of HTN, DM, allergic rhinitis and obesity who presents for f/u of her chronic medical conditions.  HTN: Her BP has been well-controlled at home.  She denies any chest pain, dyspnea or palpitations currently.   Type II DM: Her HbA1c was 6.1 in 09/24. She is currently taking metformin  500 mg QD and Mounjaro  10 mg qw.  She has been able to lose 10 lbs weight with Mounjaro  since the last visit.  Denies any polyuria or polyphagia currently.  She also takes Crestor  for HLD.  She had orthopedic surgery evaluation for pain of left popliteal area, with bulging sensation since 02/15/23.  Her pain is intermittent, worse with extension and better with rest.  She has tried ibuprofen  with mild relief.  She had x-ray of knee, which was unremarkable.  She was given gabapentin  100 mg 3 times daily and prednisone  10 mg 3 times daily by orthopedic surgeon.  She has noticed improvement with prednisone  and asks to continue it once daily dosing.  I discussed with her about potential side effects of chronic steroid therapy.  She has follow up appointment with orthopedic surgeon for left leg pain.  Past Medical History:  Diagnosis Date   Arthritis    wrists and right hip, right knee   Diabetes mellitus without complication (HCC)    GERD (gastroesophageal reflux disease)    Hyperlipidemia    Hypertension    Pneumonia    Sleep apnea     Past Surgical History:  Procedure Laterality Date   ABDOMINAL HYSTERECTOMY  2010   for heavy bleeding   BIOPSY  09/21/2021   Procedure: BIOPSY;  Surgeon: Vinetta Greening, DO;  Location: [facility];  Service: Endoscopy;;   CARPAL TUNNEL  RELEASE Right 11/08/2022   Procedure: CARPAL TUNNEL RELEASE;  Surgeon: Darrin Emerald, MD;  Location: [facility];  Service: Orthopedics;  Laterality: Right;   COLONOSCOPY N/A 10/14/2014   Procedure: COLONOSCOPY;  Surgeon: Alyce Jubilee, MD;  Location: [facility];  Service: Endoscopy;  Laterality: N/A;  115 - moved to 11:15 - office notified pt   COLONOSCOPY WITH PROPOFOL  N/A 09/21/2021   Procedure: COLONOSCOPY WITH PROPOFOL ;  Surgeon: Vinetta Greening, DO;  Location: [facility];  Service: Endoscopy;  Laterality: N/A;  9:30 / ASA 2   POLYPECTOMY  09/21/2021   Procedure: POLYPECTOMY;  Surgeon: Vinetta Greening, DO;  Location: [facility];  Service: Endoscopy;;    Family History  Problem Relation Age of Onset   Colon cancer Mother 66       passed away age 78   Breast cancer Maternal Aunt        currently treating    Social History   Socioeconomic History   Marital status: Single    Spouse name: Not on file   Number of children: Not on file   Years of education: Not on file   Highest education level: Not on file  Occupational History   Not on file  Tobacco Use   Smoking status: Former    Current packs/day: 0.00    Types: Cigarettes  Quit date: 05/02/2012    Years since quitting: 11.3   Smokeless tobacco: Never  Vaping Use   Vaping status: Never Used  Substance and Sexual Activity   Alcohol use: Not Currently    Comment: Last drink 2012; used to drink a case of beer every Friday   Drug use: No   Sexual activity: Yes  Other Topics Concern   Not on file  Social History Narrative   Not on file   Social Drivers of Health   Financial Resource Strain: Not on file  Food Insecurity: Not on file  Transportation Needs: Not on file  Physical Activity: Not on file  Stress: Not on file  Social Connections: Not on file  Intimate Partner Violence: Not on file    Outpatient Medications Prior to Visit  Medication Sig Dispense Refill   B Complex-C (VITAMIN B + C COMPLEX)  TABS Take by mouth daily at 6 (six) AM. Plus Zinc     chlorhexidine  (PERIDEX ) 0.12 % solution Mix 1 capful with water  to soak dentures overnight 473 mL 0   clindamycin (CLEOCIN T) 1 % lotion Apply topically.     gabapentin  (NEURONTIN ) 100 MG capsule Take 1 capsule (100 mg total) by mouth 3 (three) times daily. 90 capsule 2   ibuprofen  (ADVIL ) 800 MG tablet Take 1 tablet (800 mg total) by mouth every 8 (eight) hours as needed. 90 tablet 1   ipratropium (ATROVENT ) 0.03 % nasal spray Place 2 sprays into the nose 2 (two) times daily. 30 mL 1   lisinopril -hydrochlorothiazide  (ZESTORETIC ) 10-12.5 MG tablet Take 1 tablet by mouth daily. 90 tablet 0   LYSINE ACETATE PO Take by mouth daily at 6 (six) AM.     metFORMIN  (GLUCOPHAGE -XR) 500 MG 24 hr tablet Take 1 tablet by mouth once daily with breakfast 90 tablet 0   predniSONE  (DELTASONE ) 10 MG tablet Take 1 tablet (10 mg total) by mouth 3 (three) times daily. 42 tablet 0   rosuvastatin  (CRESTOR ) 10 MG tablet Take 1 tablet (10 mg total) by mouth daily. 90 tablet 0   Simethicone  (PHAZYME PO) Take 1 capsule by mouth as needed.     VITAMIN D  PO Take by mouth daily at 6 (six) AM.     tirzepatide  (MOUNJARO ) 10 MG/0.5ML Pen Inject 10 mg into the skin once a week. 6 mL 1   No facility-administered medications prior to visit.    Allergies  Allergen Reactions   Wellbutrin [Bupropion] Other (See Comments)    Spinal pain    ROS Review of Systems  Constitutional:  Positive for fatigue. Negative for chills and fever.  HENT:  Negative for congestion, postnasal drip, sinus pressure and sore throat.   Eyes:  Negative for pain and discharge.  Respiratory:  Negative for cough and shortness of breath.   Cardiovascular:  Negative for chest pain and palpitations.  Gastrointestinal:  Negative for diarrhea, nausea and vomiting.  Endocrine: Negative for polydipsia and polyuria.  Genitourinary:  Negative for dysuria, flank pain, hematuria, vaginal discharge and  vaginal pain.  Musculoskeletal:  Negative for neck pain and neck stiffness.       Left knee and leg pain  Skin:  Negative for rash.       Cyst over upper back  Neurological:  Negative for dizziness.  Psychiatric/Behavioral:  Negative for agitation and behavioral problems.       Objective:    Physical Exam Vitals reviewed.  Constitutional:      General: She is not in  acute distress.    Appearance: She is not diaphoretic.  HENT:     Head: Normocephalic and atraumatic.     Nose: Congestion present.     Comments: DNS    Mouth/Throat:     Mouth: Mucous membranes are dry.  Eyes:     General: No scleral icterus.    Extraocular Movements: Extraocular movements intact.  Cardiovascular:     Rate and Rhythm: Normal rate and regular rhythm.     Heart sounds: Normal heart sounds. No murmur heard. Pulmonary:     Breath sounds: Normal breath sounds. No wheezing or rales.  Abdominal:     General: Bowel sounds are normal.     Palpations: Abdomen is soft.     Hernia: A hernia (Umbilical) is present.  Musculoskeletal:     Cervical back: Neck supple. No tenderness.     Right lower leg: No edema.     Left lower leg: No edema.  Skin:    General: Skin is warm.     Findings: No rash.     Comments: Epidermoid cyst over upper back, about 2 cm in diameter, no discharge or bleeding noted Cyst-like structure over dorsum of left hand - about 2 cm in diameter Hyperpigmentation in axillary and neck area -likely acanthosis nigricans  Neurological:     General: No focal deficit present.     Mental Status: She is alert and oriented to person, place, and time.     Sensory: No sensory deficit.     Motor: No weakness.  Psychiatric:        Mood and Affect: Mood normal.        Behavior: Behavior normal.     BP 123/85   Pulse 79   Ht 5' 5.5" (1.664 m)   Wt 201 lb (91.2 kg)   SpO2 97%   BMI 32.94 kg/m  Wt Readings from Last 3 Encounters:  09/06/23 201 lb (91.2 kg)  08/07/23 206 lb (93.4 kg)   05/17/23 211 lb 9.6 oz (96 kg)    Lab Results  Component Value Date   TSH 1.070 03/30/2022   Lab Results  Component Value Date   WBC 6.4 11/02/2022   HGB 12.5 11/02/2022   HCT 36.4 11/02/2022   MCV 85.8 11/02/2022   PLT 315 11/02/2022   Lab Results  Component Value Date   NA 141 01/11/2023   K 4.1 01/11/2023   CO2 21 01/11/2023   GLUCOSE 128 (H) 01/11/2023   BUN 10 01/11/2023   CREATININE 0.55 (L) 01/11/2023   BILITOT 0.4 01/11/2023   ALKPHOS 81 01/11/2023   AST 39 01/11/2023   ALT 48 (H) 01/11/2023   PROT 6.9 01/11/2023   ALBUMIN 4.6 01/11/2023   CALCIUM  9.9 01/11/2023   ANIONGAP 10 11/02/2022   EGFR 111 01/11/2023   Lab Results  Component Value Date   CHOL 178 01/11/2023   Lab Results  Component Value Date   HDL 40 01/11/2023   Lab Results  Component Value Date   LDLCALC 73 01/11/2023   Lab Results  Component Value Date   TRIG 408 (H) 01/11/2023   Lab Results  Component Value Date   CHOLHDL 4.5 (H) 01/11/2023   Lab Results  Component Value Date   HGBA1C 6.1 (H) 01/11/2023      Assessment & Plan:   Problem List Items Addressed This Visit       Cardiovascular and Mediastinum   Hypertension - Primary   BP Readings from Last 1 Encounters:  09/06/23 123/85   Well-controlled with Lisinopril -HCTZ Counseled for compliance with the medications Advised DASH diet and moderate exercise/walking, at least 150 mins/week        Endocrine   DM (diabetes mellitus) (HCC)   Lab Results  Component Value Date   HGBA1C 6.1 (H) 01/11/2023   Associated with HTN and HLD Well controlled On metformin  500 mg daily On Mounjaro  10 mg qw for type II DM and obesity, and increase dose as tolerated, 12.5 mg qw after completing 10 mg supplies Advised to follow diabetic diet On statin and ACEi F/u CMP and lipid panel Diabetic eye exam: Advised to follow up with Ophthalmology for diabetic eye exam      Relevant Medications   tirzepatide  (MOUNJARO ) 12.5  MG/0.5ML Pen (Start on 09/22/2023)     Other   Chronic pain of left knee   Left knee pain and leg pain - deemed to be neuropathic pain from lumbar spine Advised to perform leg elevation, rest and ice application Due to persistent pain, had referred to orthopedic surgery - has been getting Prednisone  and Gabapentin , needs to discuss alternative of prednisone , she has tried ibuprofen , can try other oral NSAID as an alternative She has felt better with knee brace - may need further evaluation of left knee etiology, will defer to orthopedic surgeon      Chronic fatigue   Has chronic fatigue, no other B symptoms Check CBC, CMP, TSH + free T3 and T4, vitamin D  and B12 Check estradiol, progesterone, FSH/LH - was advised from hormone clinic      Relevant Orders   TSH+T4F+T3Free   Fe+TIBC+Fer   B12 deficiency   Has chronic fatigue Check vitamin B12 levels      Relevant Orders   B12   Other Visit Diagnoses       Perimenopause       Relevant Orders   Estradiol   FSH/LH   Progesterone        Meds ordered this encounter  Medications   tirzepatide  (MOUNJARO ) 12.5 MG/0.5ML Pen    Sig: Inject 12.5 mg into the skin once a week.    Dispense:  2 mL    Refill:  0    Follow-up: Return in about 4 months (around 01/07/2024) for HTN and DM.    Meldon Sport, MD

## 2023-09-06 NOTE — Assessment & Plan Note (Signed)
 Has chronic fatigue, no other B symptoms Check CBC, CMP, TSH + free T3 and T4, vitamin D  and B12 Check estradiol, progesterone, FSH/LH - was advised from hormone clinic

## 2023-09-06 NOTE — Assessment & Plan Note (Addendum)
 Left knee pain and leg pain - deemed to be neuropathic pain from lumbar spine Advised to perform leg elevation, rest and ice application Due to persistent pain, had referred to orthopedic surgery - has been getting Prednisone  and Gabapentin , needs to discuss alternative of prednisone , Teresa Russell has tried ibuprofen , can try other oral NSAID as an alternative Teresa Russell has felt better with knee brace - may need further evaluation of left knee etiology, will defer to orthopedic surgeon

## 2023-09-07 LAB — CMP14+EGFR
ALT: 80 IU/L — ABNORMAL HIGH (ref 0–32)
AST: 46 IU/L — ABNORMAL HIGH (ref 0–40)
Albumin: 4.4 g/dL (ref 3.8–4.9)
Alkaline Phosphatase: 64 IU/L (ref 44–121)
BUN/Creatinine Ratio: 22 (ref 9–23)
BUN: 13 mg/dL (ref 6–24)
Bilirubin Total: 0.4 mg/dL (ref 0.0–1.2)
CO2: 22 mmol/L (ref 20–29)
Calcium: 9.4 mg/dL (ref 8.7–10.2)
Chloride: 102 mmol/L (ref 96–106)
Creatinine, Ser: 0.6 mg/dL (ref 0.57–1.00)
Globulin, Total: 2.3 g/dL (ref 1.5–4.5)
Glucose: 159 mg/dL — ABNORMAL HIGH (ref 70–99)
Potassium: 3.6 mmol/L (ref 3.5–5.2)
Sodium: 140 mmol/L (ref 134–144)
Total Protein: 6.7 g/dL (ref 6.0–8.5)
eGFR: 109 mL/min/{1.73_m2} (ref >59)

## 2023-09-07 LAB — CBC WITH DIFFERENTIAL/PLATELET
Basophils Absolute: 0.1 10*3/uL (ref 0.0–0.2)
Basos: 1 %
EOS (ABSOLUTE): 0.1 10*3/uL (ref 0.0–0.4)
Eos: 2 %
Hematocrit: 41.4 % (ref 34.0–46.6)
Hemoglobin: 13.9 g/dL (ref 11.1–15.9)
Immature Grans (Abs): 0.1 10*3/uL (ref 0.0–0.1)
Immature Granulocytes: 1 %
Lymphocytes Absolute: 5.8 10*3/uL — ABNORMAL HIGH (ref 0.7–3.1)
Lymphs: 68 %
MCH: 29.7 pg (ref 26.6–33.0)
MCHC: 33.6 g/dL (ref 31.5–35.7)
MCV: 89 fL (ref 79–97)
Monocytes Absolute: 0.4 10*3/uL (ref 0.1–0.9)
Monocytes: 5 %
Neutrophils Absolute: 2 10*3/uL (ref 1.4–7.0)
Neutrophils: 23 %
Platelets: 251 10*3/uL (ref 150–450)
RBC: 4.68 x10E6/uL (ref 3.77–5.28)
RDW: 13.2 % (ref 11.7–15.4)
WBC: 8.5 10*3/uL (ref 3.4–10.8)

## 2023-09-07 LAB — TSH+T4F+T3FREE
Free T4: 1.14 ng/dL (ref 0.82–1.77)
T3, Free: 3.1 pg/mL (ref 2.0–4.4)
TSH: 3.25 u[IU]/mL (ref 0.450–4.500)

## 2023-09-07 LAB — TSH: TSH: 3.14 u[IU]/mL (ref 0.450–4.500)

## 2023-09-07 LAB — HEMOGLOBIN A1C
Est. average glucose Bld gHb Est-mCnc: 166 mg/dL
Hgb A1c MFr Bld: 7.4 % — ABNORMAL HIGH (ref 4.8–5.6)

## 2023-09-07 LAB — VITAMIN D 25 HYDROXY (VIT D DEFICIENCY, FRACTURES): Vit D, 25-Hydroxy: 26.2 ng/mL — ABNORMAL LOW (ref 30.0–100.0)

## 2023-09-07 LAB — ESTRADIOL: Estradiol: 14.5 pg/mL

## 2023-09-07 LAB — LIPID PANEL
Chol/HDL Ratio: 3.4 ratio (ref 0.0–4.4)
Cholesterol, Total: 201 mg/dL — ABNORMAL HIGH (ref 100–199)
HDL: 60 mg/dL (ref >39)
LDL Chol Calc (NIH): 84 mg/dL (ref 0–99)
Triglycerides: 354 mg/dL — ABNORMAL HIGH (ref 0–149)
VLDL Cholesterol Cal: 57 mg/dL — ABNORMAL HIGH (ref 5–40)

## 2023-09-07 LAB — FSH/LH
FSH: 37.3 m[IU]/mL
LH: 18.5 m[IU]/mL

## 2023-09-07 LAB — IRON,TIBC AND FERRITIN PANEL
Ferritin: 192 ng/mL — ABNORMAL HIGH (ref 15–150)
Iron Saturation: 21 % (ref 15–55)
Iron: 68 ug/dL (ref 27–159)
Total Iron Binding Capacity: 322 ug/dL (ref 250–450)
UIBC: 254 ug/dL (ref 131–425)

## 2023-09-07 LAB — VITAMIN B12: Vitamin B-12: >2000 pg/mL — ABNORMAL HIGH (ref 232–1245)

## 2023-09-07 LAB — PROGESTERONE: Progesterone: <0.1 ng/mL

## 2023-09-18 ENCOUNTER — Ambulatory Visit: Admitting: Orthopedic Surgery

## 2023-09-18 DIAGNOSIS — G8929 Other chronic pain: Secondary | ICD-10-CM

## 2023-09-18 DIAGNOSIS — M25562 Pain in left knee: Secondary | ICD-10-CM | POA: Diagnosis not present

## 2023-09-18 DIAGNOSIS — M5410 Radiculopathy, site unspecified: Secondary | ICD-10-CM | POA: Diagnosis not present

## 2023-09-18 NOTE — Progress Notes (Signed)
   There were no vitals taken for this visit.  There is no height or weight on file to calculate BMI.  Chief Complaint  Patient presents with   Knee Pain    Left- stopped taking prednisone  due to weight gain still taking gabapentin  feels better    Encounter Diagnoses  Name Primary?   Radicular leg pain Yes   Chronic pain of left knee    Improved

## 2023-09-18 NOTE — Patient Instructions (Signed)
 Continue gabapentin  and Advil   It is okay if you stop the gabapentin  to see if your symptoms come back if they do you can resume the gabapentin 

## 2023-09-18 NOTE — Progress Notes (Signed)
  Chief Complaint  Patient presents with   Knee Pain    Left- stopped taking prednisone  due to weight gain still taking gabapentin  feels better    Encounter Diagnoses  Name Primary?   Radicular leg pain Yes   Chronic pain of left knee    Improved  53 year old female treated for unclear pain in her left leg with the possibility of maybe being a radicular syndrome  She did take prednisone  and gabapentin   However when she stopped taking the prednisone  her symptoms flared up again so she took 1 prednisone  tablet and gabapentin  and then eventually took Advil   Currently asymptomatic Recommend continue gabapentin  and Advil   Okay to stop gabapentin  if her symptoms do not return

## 2023-10-01 ENCOUNTER — Encounter: Payer: Self-pay | Admitting: Internal Medicine

## 2023-10-01 DIAGNOSIS — K1379 Other lesions of oral mucosa: Secondary | ICD-10-CM

## 2023-10-02 MED ORDER — CHLORHEXIDINE GLUCONATE 0.12 % MT SOLN: OROMUCOSAL | 0 refills | Status: DC

## 2023-10-16 ENCOUNTER — Other Ambulatory Visit: Payer: Self-pay | Admitting: Internal Medicine

## 2023-10-16 DIAGNOSIS — I10 Essential (primary) hypertension: Secondary | ICD-10-CM

## 2023-10-16 DIAGNOSIS — E782 Mixed hyperlipidemia: Secondary | ICD-10-CM

## 2023-10-18 ENCOUNTER — Encounter: Payer: Self-pay | Admitting: Orthopedic Surgery

## 2023-10-18 ENCOUNTER — Encounter: Payer: Self-pay | Admitting: Internal Medicine

## 2023-10-24 ENCOUNTER — Encounter: Payer: Self-pay | Admitting: Internal Medicine

## 2023-10-25 ENCOUNTER — Telehealth (INDEPENDENT_AMBULATORY_CARE_PROVIDER_SITE_OTHER): Admitting: Internal Medicine

## 2023-10-25 ENCOUNTER — Encounter: Payer: Self-pay | Admitting: Internal Medicine

## 2023-10-25 DIAGNOSIS — M5410 Radiculopathy, site unspecified: Secondary | ICD-10-CM | POA: Insufficient documentation

## 2023-10-25 DIAGNOSIS — M51362 Other intervertebral disc degeneration, lumbar region with discogenic back pain and lower extremity pain: Secondary | ICD-10-CM | POA: Insufficient documentation

## 2023-10-25 DIAGNOSIS — G8929 Other chronic pain: Secondary | ICD-10-CM | POA: Diagnosis not present

## 2023-10-25 DIAGNOSIS — M25562 Pain in left knee: Secondary | ICD-10-CM

## 2023-10-25 MED ORDER — CELECOXIB 200 MG PO CAPS: 200.0000 mg | ORAL_CAPSULE | Freq: Two times a day (BID) | ORAL | 0 refills | Status: AC

## 2023-10-25 NOTE — Progress Notes (Signed)
 Virtual Visit via Video Note   Because of Teresa Russell's co-morbid illnesses, she is at least at moderate risk for complications without adequate follow up.  This format is felt to be most appropriate for this patient at this time.  All issues noted in this document were discussed and addressed.  A limited physical exam was performed with this format.      Evaluation Performed:  Follow-up visit  Date:  10/25/2023   ID:  Teresa Russell, DOB 10-27-1970, MRN [id number]  Patient Location: Home Provider Location: Office/Clinic  Participants: Patient Location of Patient: Home Location of Provider: Telehealth Consent was obtain for visit to be over via telehealth. I verified that I am speaking with the correct person using two identifiers.  PCP:  Teresa Suzzane POUR, MD   Chief Complaint: Left leg pain  History of Present Illness:    Teresa Russell is a 53 y.o. female who has a video visit for complaint of left LE pain, which is worse for the last 2 days.  She has chronic left knee pain, has been evaluated by orthopedic surgery, was given oral prednisone .  She stopped taking oral prednisone  after discussion as it was causing weight gain.  She has been taking gabapentin  100 mg 3 times daily and ibuprofen  without much relief.  Of note, she has to stand for long hours at her workplace, which worsens her knee and leg pain.  She also reports low back pain, constant, dull, 8/10, radiating towards hip and thigh area for the last 2 days.  Denies any recent injury or fall.  Denies saddle anesthesia, urinary or stool incontinence.  The patient does not have symptoms concerning for COVID-19 infection (fever, chills, cough, or new shortness of breath).   Past Medical, Surgical, Social History, Allergies, and Medications have been Reviewed.  Past Medical History:  Diagnosis Date   Arthritis    wrists and right hip, right knee   Diabetes mellitus without complication (HCC)    GERD  (gastroesophageal reflux disease)    Hyperlipidemia    Hypertension    Pneumonia    Sleep apnea    Past Surgical History:  Procedure Laterality Date   ABDOMINAL HYSTERECTOMY  2010   for heavy bleeding   BIOPSY  09/21/2021   Procedure: BIOPSY;  Surgeon: Teresa Carlin POUR, DO;  [facility];  Service: Endoscopy;;   CARPAL TUNNEL RELEASE Right 11/08/2022   Procedure: CARPAL TUNNEL RELEASE;  Surgeon: Teresa Taft BRAVO, MD;  Location: [facility];  Service: Orthopedics;  Laterality: Right;   COLONOSCOPY N/A 10/14/2014   Procedure: COLONOSCOPY;  Surgeon: Teresa LITTIE Haddock, MD;  [facility];  Service: Endoscopy;  Laterality: N/A;  115 - moved to 11:15 - office notified pt   COLONOSCOPY WITH PROPOFOL  N/A 09/21/2021   Procedure: COLONOSCOPY WITH PROPOFOL ;  Surgeon: Teresa Carlin POUR, DO;  [facility];  Service: Endoscopy;  Laterality: N/A;  9:30 / ASA 2   POLYPECTOMY  09/21/2021   Procedure: POLYPECTOMY;  Surgeon: Teresa Carlin POUR, DO;  [facility];  Service: Endoscopy;;     Current Meds  Medication Sig   celecoxib (CELEBREX) 200 MG capsule Take 1 capsule (200 mg total) by mouth 2 (two) times daily.     Allergies:   Wellbutrin [bupropion]   ROS:   Please see the history of present illness. All other systems reviewed and are negative.   Labs/Other Tests and Data Reviewed:  Recent Labs: 09/06/2023: ALT 80; BUN 13; Creatinine, Ser 0.60; Hemoglobin 13.9; Platelets 251; Potassium 3.6; Sodium 140; TSH 3.140   Recent Lipid Panel Lab Results  Component Value Date/Time   CHOL 201 (H) 09/06/2023 08:48 AM   TRIG 354 (H) 09/06/2023 08:48 AM   HDL 60 09/06/2023 08:48 AM   CHOLHDL 3.4 09/06/2023 08:48 AM   LDLCALC 84 09/06/2023 08:48 AM    Wt Readings from Last 3 Encounters:  09/06/23 201 lb (91.2 kg)  08/07/23 206 lb (93.4 kg)  05/17/23 211 lb 9.6 oz (96 kg)     Objective:    Vital Signs:  There were no vitals taken for this visit.   VITAL  SIGNS:  reviewed GEN:  no acute distress EYES:  sclerae anicteric, EOMI - Extraocular Movements Intact RESPIRATORY:  normal respiratory effort, symmetric expansion NEURO:  alert and oriented x 3, no obvious focal deficit PSYCH:  normal affect  ASSESSMENT & PLAN:    Degeneration of intervertebral disc of lumbar region with discogenic back pain and lower extremity pain Acute on chronic low back pain with radicular symptoms Check x-ray of lumbar spine Celecoxib 200 mg twice daily as needed for pain Continue gabapentin  100 mg 3 times daily Has tried taking oral prednisone  recently without much relief Avoid heavy lifting and frequent bending Back brace and/or heating pad as needed for pain Simple back exercises material provided Work note provided for 5 days  Chronic pain of left knee Left knee pain and leg pain - deemed to be neuropathic pain from lumbar spine Advised to perform leg elevation, rest and ice application Due to persistent pain, had referred to orthopedic surgery - was getting Prednisone  and Gabapentin , but stopped prednisone  after discussion due to weight gain Started celecoxib instead of ibuprofen  for pain She has felt better with knee brace - may need further evaluation of left knee etiology, will defer to orthopedic surgeon  Radicular leg pain Radicular pain likely due to lumbar DDD Check x-ray of lumbar spine Simple back exercises material provided    I discussed the assessment and treatment plan with the patient. The patient was provided an opportunity to ask questions, and all were answered. The patient agreed with the plan and demonstrated an understanding of the instructions.   The patient was advised to call back or seek an in-person evaluation if the symptoms worsen or if the condition fails to improve as anticipated.  The above assessment and management plan was discussed with the patient. The patient verbalized understanding of and has agreed to the  management plan.   Medication Adjustments/Labs and Tests Ordered: Current medicines are reviewed at length with the patient today.  Concerns regarding medicines are outlined above.   Tests Ordered: Orders Placed This Encounter  Procedures   DG Lumbar Spine Complete    Medication Changes: Meds ordered this encounter  Medications   celecoxib (CELEBREX) 200 MG capsule    Sig: Take 1 capsule (200 mg total) by mouth 2 (two) times daily.    Dispense:  60 capsule    Refill:  0     Note: This dictation was prepared with Dragon dictation along with smaller phrase technology. Similar sounding words can be transcribed inadequately or may not be corrected upon review. Any transcriptional errors that result from this process are unintentional.      Disposition:  Follow up  Signed, Suzzane MARLA Blanch, MD  10/25/2023 9:18 AM     [facility]

## 2023-10-25 NOTE — Assessment & Plan Note (Signed)
 Radicular pain likely due to lumbar DDD Check x-ray of lumbar spine Simple back exercises material provided

## 2023-10-25 NOTE — Telephone Encounter (Signed)
 scheduled

## 2023-10-25 NOTE — Assessment & Plan Note (Signed)
 Left knee pain and leg pain - deemed to be neuropathic pain from lumbar spine Advised to perform leg elevation, rest and ice application Due to persistent pain, had referred to orthopedic surgery - was getting Prednisone  and Gabapentin , but stopped prednisone  after discussion due to weight gain Started celecoxib instead of ibuprofen  for pain She has felt better with knee brace - may need further evaluation of left knee etiology, will defer to orthopedic surgeon

## 2023-10-25 NOTE — Assessment & Plan Note (Signed)
 Acute on chronic low back pain with radicular symptoms Check x-ray of lumbar spine Celecoxib 200 mg twice daily as needed for pain Continue gabapentin  100 mg 3 times daily Has tried taking oral prednisone  recently without much relief Avoid heavy lifting and frequent bending Back brace and/or heating pad as needed for pain Simple back exercises material provided Work note provided for 5 days

## 2023-10-25 NOTE — Patient Instructions (Addendum)
 Please get x-ray of lumbar spine done at [facility].  Please take celecoxib as needed for leg pain.  Please perform leg elevation as tolerated.  Please perform simple back exercises as shown in the material.

## 2023-10-26 ENCOUNTER — Other Ambulatory Visit: Payer: Self-pay | Admitting: Internal Medicine

## 2023-10-26 DIAGNOSIS — E1169 Type 2 diabetes mellitus with other specified complication: Secondary | ICD-10-CM

## 2023-10-26 MED ORDER — TIRZEPATIDE 15 MG/0.5ML ~~LOC~~ SOAJ: 15.0000 mg | SUBCUTANEOUS | 1 refills | Status: DC

## 2023-11-01 ENCOUNTER — Encounter: Payer: Self-pay | Admitting: Internal Medicine

## 2023-11-01 ENCOUNTER — Ambulatory Visit (HOSPITAL_COMMUNITY)
Admission: RE | Admit: 2023-11-01 | Discharge: 2023-11-01 | Disposition: A | Discharge status: Home / Self Care | Source: Ambulatory Visit | Attending: Internal Medicine | Admitting: Internal Medicine

## 2023-11-01 DIAGNOSIS — M51362 Other intervertebral disc degeneration, lumbar region with discogenic back pain and lower extremity pain: Secondary | ICD-10-CM | POA: Insufficient documentation

## 2023-11-01 DIAGNOSIS — M419 Scoliosis, unspecified: Secondary | ICD-10-CM | POA: Diagnosis not present

## 2023-11-01 DIAGNOSIS — M545 Low back pain, unspecified: Secondary | ICD-10-CM | POA: Diagnosis not present

## 2023-11-01 DIAGNOSIS — I878 Other specified disorders of veins: Secondary | ICD-10-CM | POA: Diagnosis not present

## 2023-11-06 ENCOUNTER — Ambulatory Visit: Admitting: Orthopedic Surgery

## 2023-11-06 ENCOUNTER — Ambulatory Visit: Payer: Self-pay | Admitting: Internal Medicine

## 2023-11-06 ENCOUNTER — Encounter: Payer: Self-pay | Admitting: Orthopedic Surgery

## 2023-11-06 ENCOUNTER — Ambulatory Visit (HOSPITAL_COMMUNITY)
Admission: RE | Admit: 2023-11-06 | Discharge: 2023-11-06 | Disposition: A | Discharge status: Home / Self Care | Source: Ambulatory Visit | Attending: Orthopedic Surgery | Admitting: Orthopedic Surgery

## 2023-11-06 DIAGNOSIS — M5410 Radiculopathy, site unspecified: Secondary | ICD-10-CM | POA: Insufficient documentation

## 2023-11-06 DIAGNOSIS — M5126 Other intervertebral disc displacement, lumbar region: Secondary | ICD-10-CM | POA: Diagnosis not present

## 2023-11-06 DIAGNOSIS — M5115 Intervertebral disc disorders with radiculopathy, thoracolumbar region: Secondary | ICD-10-CM | POA: Diagnosis not present

## 2023-11-06 DIAGNOSIS — M5116 Intervertebral disc disorders with radiculopathy, lumbar region: Secondary | ICD-10-CM | POA: Diagnosis not present

## 2023-11-06 DIAGNOSIS — M4726 Other spondylosis with radiculopathy, lumbar region: Secondary | ICD-10-CM | POA: Diagnosis not present

## 2023-11-06 DIAGNOSIS — M48061 Spinal stenosis, lumbar region without neurogenic claudication: Secondary | ICD-10-CM | POA: Diagnosis not present

## 2023-11-06 MED ORDER — GABAPENTIN 300 MG PO CAPS: 300.0000 mg | ORAL_CAPSULE | Freq: Three times a day (TID) | ORAL | 5 refills | Status: AC

## 2023-11-06 MED ORDER — PREDNISONE 10 MG (48) PO TBPK: ORAL_TABLET | Freq: Every day | ORAL | 0 refills | Status: DC

## 2023-11-06 NOTE — Progress Notes (Signed)
   Chief Complaint  Patient presents with   Leg Pain    Left / all the way down leg buttock back of knee into foot at times no relief now with the Prednisone  or the Gabapentin     53 year old female now with almost 1 year of pain in the back of her left knee which now includes pain from her left hip radiating down to her left foot.  Previously treated with gabapentin  and prednisone  with minimal relief.  Started Celebrex  and made her sleepy she could not take it.  Primary care ordered a lumbar spine film which showed lumbar spondylosis and scoliosis  Comes in today with moderate to severe pain left hip down left leg into the left foot  Reflex changes none motor changes none pain tenderness lumbar spine normal range of motion hip and knee  X-ray as stated outside films reviewed I agree with the mild lumbar spondylosis noted   Encounter Diagnoses  Name Primary?   Radicular pain of left lower extremity Yes   Radicular leg pain    Protrusion of lumbar intervertebral disc     Recommend MRI spine  Followed by referral to neurosurgery or Dr. Eldonna for injection versus spine consultation for surgery  Meds ordered this encounter  Medications   gabapentin  (NEURONTIN ) 300 MG capsule    Sig: Take 1 capsule (300 mg total) by mouth 3 (three) times daily.    Dispense:  90 capsule    Refill:  5   predniSONE  (STERAPRED UNI-PAK 48 TAB) 10 MG (48) TBPK tablet    Sig: Take by mouth daily.    Dispense:  48 tablet    Refill:  0   Note: Ms. Tuccillo was adamant about getting a pain pill for the pain she was having as she did not respond well to the gabapentin  100 mg 3 times a day but reminded her that opioids are not indicated for this condition and we would try another Dosepak and increase in the gabapentin

## 2023-11-06 NOTE — Patient Instructions (Addendum)
 While we are working on your approval for MRI please go ahead and call to schedule your appointment with [facility] within at least one (1) week.   Central Scheduling [phone]  For pain:  Take gabapentin  300 mg 3 x a day  Take the prednisone  dose pack for 12 days then start Ibuprofen  over the counter 800 mg 3 times a day  Opioids pain pills are contraindicated for back pain and pinched nerves

## 2023-11-06 NOTE — Progress Notes (Signed)
   There were no vitals taken for this visit.  There is no height or weight on file to calculate BMI.  Chief Complaint  Patient presents with   Leg Pain    Left / all the way down leg buttock back of knee into foot at times no relief now with the Prednisone  or the Gabapentin      No diagnosis found.  DOI/DOS/ Date: ongong   Worse

## 2023-11-14 ENCOUNTER — Ambulatory Visit: Payer: Self-pay

## 2023-11-14 DIAGNOSIS — R030 Elevated blood-pressure reading, without diagnosis of hypertension: Secondary | ICD-10-CM | POA: Diagnosis not present

## 2023-11-14 DIAGNOSIS — N390 Urinary tract infection, site not specified: Secondary | ICD-10-CM | POA: Diagnosis not present

## 2023-11-14 NOTE — Telephone Encounter (Signed)
 FYI Only or Action Required?: FYI only for provider.  Patient was last seen in primary care on 10/25/2023 by Teresa Suzzane POUR, MD.  Called Nurse Triage reporting Urinary Tract Infection.  Symptoms began a week ago.  Interventions attempted: Nothing.  Symptoms are: unchanged.  Triage Disposition: See Physician Within 24 Hours  Patient/caregiver understands and will follow disposition?: YesCopied from CRM [id number]. Topic: Clinical - Red Word Triage >> Nov 14, 2023  8:37 AM Teresa Russell wrote: Patient is having pain and pressure when urinating and after for about a week. Reason for Disposition  Bad or foul-smelling urine  Answer Assessment - Initial Assessment Questions 1. SYMPTOM: What's the main symptom you're concerned about? (e.g., frequency, incontinence)     Pressure/pain after urinating  2. ONSET: When did the    start?     1 week  3. PAIN: Is there any pain? If Yes, ask: How bad is it? (Scale: 1-10; mild, moderate, severe)     6 4. CAUSE: What do you think is causing the symptoms?     Possible UTI 5. OTHER SYMPTOMS: Do you have any other symptoms? (e.g., blood in urine, fever, flank pain, pain with urination)     Odor is strong   Pt stated symptoms started a week ago. PT has not tried any OTC medication. The pain/pressure is there before and after urinating. No office appts within timeframe. RN advised pt go to UC today. Pt is taking prednisone /gabapentin  for leg pain.  Protocols used: Urinary Symptoms-A-AH

## 2023-11-16 ENCOUNTER — Encounter: Payer: Self-pay | Admitting: Orthopedic Surgery

## 2023-11-16 ENCOUNTER — Ambulatory Visit: Admitting: Orthopedic Surgery

## 2023-11-16 DIAGNOSIS — M5410 Radiculopathy, site unspecified: Secondary | ICD-10-CM | POA: Diagnosis not present

## 2023-11-16 MED ORDER — TRAMADOL HCL 50 MG PO TABS: 50.0000 mg | ORAL_TABLET | Freq: Four times a day (QID) | ORAL | 5 refills | Status: AC | PRN

## 2023-11-16 NOTE — Progress Notes (Signed)
   There were no vitals taken for this visit.  There is no height or weight on file to calculate BMI.  Chief Complaint  Patient presents with   Results    Review MRI    No diagnosis found.  DOI/DOS/ Date:    Unchanged

## 2023-11-16 NOTE — Progress Notes (Signed)
 Follow-up for Ms. Petrovich  Chief Complaint  Patient presents with   Results    Review MRI   Leg Pain    Left     Encounter Diagnosis  Name Primary?   Radicular pain of left lower extremity Yes    Ms. Hemrick continues to have radicular pain in her left lower extremity associated with lower back pain  She has been on prednisone  and gabapentin  without good pain relief  Her MRI was completed and reviewed  She has foraminal stenosis and protruding disc affecting the left neural elements  We discussed the following treatment options  #1 physical therapy and medication  #2 epidural injection  #3 surgical intervention  At this point she does not want to consider surgical intervention or epidural injection and opted for physical therapy and medication  Meds ordered this encounter  Medications   traMADol  (ULTRAM ) 50 MG tablet    Sig: Take 1 tablet (50 mg total) by mouth every 6 (six) hours as needed.    Dispense:  60 tablet    Refill:  5    We added tramadol  to the prednisone  and the gabapentin .  6 weeks physical therapy and then return if no improvement consult will be obtained for further management  L3-L4: Annular disc bulge and mild bilateral facet hypertrophy. No canal stenosis. Mild bilateral foraminal stenosis.   L4-L5: No disc bulge. Mild bilateral facet arthropathy. No canal stenosis. Mild-to-moderate left foraminal stenosis.   L5-S1: Mild left paracentral disc protrusion. No foraminal or canal stenosis. Six   IMPRESSION: 1. Mild multilevel lumbar spondylosis. No canal stenosis at any level. 2. Mild bilateral foraminal stenosis at L3-L4 and mild-to-moderate left foraminal stenosis at L4-L5.

## 2023-11-25 ENCOUNTER — Other Ambulatory Visit: Payer: Self-pay | Admitting: Internal Medicine

## 2023-11-27 ENCOUNTER — Ambulatory Visit: Admitting: Physical Therapy

## 2023-12-04 ENCOUNTER — Encounter: Payer: Self-pay | Admitting: Physical Therapy

## 2023-12-04 ENCOUNTER — Ambulatory Visit: Attending: Orthopedic Surgery | Admitting: Physical Therapy

## 2023-12-04 DIAGNOSIS — M5410 Radiculopathy, site unspecified: Secondary | ICD-10-CM | POA: Diagnosis not present

## 2023-12-04 DIAGNOSIS — M79605 Pain in left leg: Secondary | ICD-10-CM | POA: Diagnosis not present

## 2023-12-04 DIAGNOSIS — M5459 Other low back pain: Secondary | ICD-10-CM | POA: Diagnosis not present

## 2023-12-04 NOTE — Therapy (Signed)
 OUTPATIENT PHYSICAL THERAPY EVALUATION   Patient Name: Teresa Russell MRN: [id number] DOB:Aug 30, 1970, 53 y.o., female Today's Date: 12/04/2023  END OF SESSION:  PT End of Session - 12/04/23 0944     Visit Number 1    Number of Visits 10    Date for PT Re-Evaluation 01/15/24    PT Start Time 0850    PT Stop Time 0930    PT Time Calculation (min) 40 min    Activity Tolerance Patient tolerated treatment well    Behavior During Therapy [facility] for tasks assessed/performed          Past Medical History:  Diagnosis Date   Arthritis    wrists and right hip, right knee   Diabetes mellitus without complication (HCC)    GERD (gastroesophageal reflux disease)    Hyperlipidemia    Hypertension    Pneumonia    Sleep apnea    Past Surgical History:  Procedure Laterality Date   ABDOMINAL HYSTERECTOMY  2010   for heavy bleeding   BIOPSY  09/21/2021   Procedure: BIOPSY;  Surgeon: Cindie Carlin POUR, DO;  [facility];  Service: Endoscopy;;   CARPAL TUNNEL RELEASE Right 11/08/2022   Procedure: CARPAL TUNNEL RELEASE;  Surgeon: Margrette Taft BRAVO, MD;  Location: [facility];  Service: Orthopedics;  Laterality: Right;   COLONOSCOPY N/A 10/14/2014   Procedure: COLONOSCOPY;  Surgeon: Margo LITTIE Haddock, MD;  [facility];  Service: Endoscopy;  Laterality: N/A;  115 - moved to 11:15 - office notified pt   COLONOSCOPY WITH PROPOFOL  N/A 09/21/2021   Procedure: COLONOSCOPY WITH PROPOFOL ;  Surgeon: Cindie Carlin POUR, DO;  [facility];  Service: Endoscopy;  Laterality: N/A;  9:30 / ASA 2   POLYPECTOMY  09/21/2021   Procedure: POLYPECTOMY;  Surgeon: Cindie Carlin POUR, DO;  [facility];  Service: Endoscopy;;   Patient Active Problem List   Diagnosis Date Noted   Degeneration of intervertebral disc of lumbar region with discogenic back pain and lower extremity pain 10/25/2023   Radicular leg pain 10/25/2023   Chronic fatigue 09/06/2023   B12 deficiency 09/06/2023    COVID-19 06/13/2023   Ganglion cyst of dorsum of left wrist 05/17/2023   Chronic pain of left knee 05/17/2023   Right lower quadrant abdominal pain 10/12/2022   Carpal tunnel syndrome of right wrist 10/12/2022   Mass of upper inner quadrant of left breast 05/17/2022   Moderate episode of recurrent major depressive disorder (HCC) 05/17/2022   Refused influenza vaccine 01/26/2022   H/O abdominal hysterectomy 11/10/2021   Acanthosis nigricans 11/10/2021   Mixed hyperlipidemia 11/10/2021   Grief reaction 08/11/2021   Hemorrhoids 06/09/2021   Obesity (BMI 30-39.9) 06/09/2021   Umbilical hernia without obstruction and without gangrene 03/15/2021   Encounter for general adult medical examination with abnormal findings 12/30/2020   Eczema 12/30/2020   Gastroesophageal reflux disease without esophagitis 12/30/2020   Hypersomnolence 07/29/2020   Hypertension 07/15/2020   DM (diabetes mellitus) (HCC) 07/15/2020   Allergic rhinitis 07/15/2020   Deviated nasal septum 07/15/2020   Epidermoid cyst of skin 09/18/2019   Family history of colon cancer in mother 09/18/2019   Rectal abscess 10/07/2014    PCP: Tobie Suzzane POUR, MD   REFERRING PROVIDER: Margrette Taft BRAVO, MD   REFERRING DIAG: M54.10 (ICD-10-CM) - Radicular pain of left lower extremity   Rationale for Evaluation and Treatment:  Rehabiliation  THERAPY DIAG:  Other low back pain  Pain in left leg  ONSET  DATE: Pain for 3 months   SUBJECTIVE:                                                                                                                                                                                           SUBJECTIVE STATEMENT: Pain for 2-3 months that runs down her left leg.   PERTINENT HISTORY:  See above PMH  PAIN:  NPRS scale: 8.5/10 upon arrival Pain location:left leg Pain description: constant Aggravating factors: driving, sitting, bend and extend leg Relieving factors: meds, sometimes knee  brace   PRECAUTIONS: ,  None  RED FLAGS: None   WEIGHT BEARING RESTRICTIONS:  No  FALLS:  Has patient fallen in last 6 months? 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  DIAGNOSTIC FINDINGS:  Lumbar MRI 11/06/23 IMPRESSION: L1-L2: Unremarkable.   L2-L3: Unremarkable.   L3-L4: Annular disc bulge and mild bilateral facet hypertrophy. No canal stenosis. Mild bilateral foraminal stenosis.   L4-L5: No disc bulge. Mild bilateral facet arthropathy. No canal stenosis. Mild-to-moderate left foraminal stenosis.   L5-S1: Mild left paracentral disc protrusion. No foraminal or canal stenosis. Six 1. Mild multilevel lumbar spondylosis. No canal stenosis at any level. 2. Mild bilateral foraminal stenosis at L3-L4 and mild-to-moderate left foraminal stenosis at L4-L5.  PATIENT SURVEYS:  Patient-Specific Activity Scoring Scheme  0 represents "unable to perform." 10 represents "able to perform at prior level. 0 1 2 3 4 5 6 7 8 9  10 (Date and Score)   Activity Eval     1. Driving 20 minutes  5    2. Walking 30 minutes/6 laps at track  1    3.     4.    5.    Score 3/10    Total score = sum of the activity scores/number of activities Minimum detectable change (90%CI) for average score = 2 points Minimum detectable change (90%CI) for single activity score = 3 points  GAIT: Assistive device utilized: None Level of assistance: Complete Independence Comments: pain with walking anymore than 20 minutes.     LUMBAR ROM:   Active  AROM  eval  Flexion WNL  Extension 50%  Right lateral flexion 50%  Left lateral flexion 50%  Right rotation WNL  Left rotation WNL   (Blank rows = not tested)   LOWER EXTREMITY Strength     MMT in sitting  Right eval Left eval  Hip flexion 5 3+  Hip extension    Hip abduction 5 3+  Hip adduction 5 3+  Hip internal rotation    Hip external rotation    Knee flexion 5 3+  Knee extension 5 3+  Ankle dorsiflexion    Ankle plantarflexion    Ankle inversion    Ankle eversion     (Blank rows = not tested)    SPECIAL TESTS:  Lumbar Straight leg raise test: Positive and Slump test: Positive for left leg on both that was negative for right leg                                                                                                                            TREATMENT DATE:  Eval HEP creation and review with demonstration and trial set preformed, see below for details Moist heat X 10 min to low back and left leg (not included in billable time)    PATIENT EDUCATION: Education details: HEP, PT plan of care, selfcare Person educated: Patient Education method: Explanation, Demonstration, Verbal cues, and Handouts Education comprehension: verbalized understanding, further education recommended   HOME EXERCISE PROGRAM: Access Code: [email or web address] URL: [email or web address] Date: 12/04/2023 Prepared by: Redell Moose  Exercises - Standing Lumbar Extension at Wall - Forearms  - 2 x daily - 6 x weekly - 1 sets - 10 reps - 3 sec hold - Standing Hip Extension with Counter Support  - 2 x daily - 6 x weekly - 1 sets - 10 reps - 3 sec hold - Slump Stretch  - 2 x daily - 6 x weekly - 1 sets - 10 reps - 3 sec hold - Supine Bridge  - 2 x daily - 6 x weekly - 1 sets - 10 reps - 3 sec hold - Supine Piriformis Stretch with Foot on Ground (Mirrored)  - 2 x daily - 6 x weekly - 3 sets - 30 sec hold  ASSESSMENT:  CLINICAL IMPRESSION: Patient referred to PT for Lumbar radicular pain. She has foraminal stenosis and protruding disc affecting the left neural elements based on MRI results.  Patient will benefit from skilled PT to improve overall function and to address impairments and limitations listed below.  OBJECTIVE IMPAIRMENTS: decreased activity tolerance for  ADL's, difficulty walking, decreased balance, decreased endurance, decreased mobility, decreased ROM, decreased strength, impaired flexibility, impaired UE/LE use, and pain.  ACTIVITY LIMITATIONS: bending, liftting, walking, standing, cleaning, community activity, driving,  carry, occupation  PERSONAL FACTORS: see above PMH  also affecting patient's functional outcome.  REHAB POTENTIAL: Good  CLINICAL DECISION MAKING: Stable/uncomplicated  EVALUATION COMPLEXITY: Low    GOALS: Short term PT Goals Target date: 01/01/2024   Pt will be I and compliant with HEP. Baseline:  Goal status: New Pt will decrease pain by 25% overall Baseline: Goal status: New  Long term PT goals Target date:01/15/2024   Pt will improve lumbar AROM to [facility] to improve functional mobility Baseline: Goal status: New Pt will improve bilat leg strength to at least 4+/5 MMT to improve functional strength Baseline:  Goal status: New Pt will improve Patient specific functional scale (PSFS) to at least 7/10 to show improved function level Baseline: Goal status: New Pt will reduce pain to overall less than 3/10 with usual activity and work activity. Baseline: Goal status: New Pt will be able to ambulate community distances at least 30 minutest WNL gait pattern without complaints Baseline: Goal status: New  PLAN: PT FREQUENCY: 2-3 times per week   PT DURATION: 6 weeks  PLANNED INTERVENTIONS  [id number]-Therapeutic exercises, 97530- Therapeutic activity, [id number]- Neuromuscular re-education, 97535- Self Care, [id number]- Manual therapy, [id number]- Electrical stimulation (unattended), and 02987- Traction (mechanical)  PLAN FOR NEXT SESSION: review HEP NEXT MD VISIT: 12/28/23  Redell JONELLE Moose, PT,DPT 12/04/2023, 9:47 AM

## 2023-12-06 ENCOUNTER — Encounter: Payer: Self-pay | Admitting: Orthopedic Surgery

## 2023-12-14 ENCOUNTER — Ambulatory Visit: Admitting: Physical Therapy

## 2023-12-19 ENCOUNTER — Ambulatory Visit: Admitting: Physical Therapy

## 2023-12-22 ENCOUNTER — Encounter: Payer: Self-pay | Admitting: Radiology

## 2023-12-26 ENCOUNTER — Other Ambulatory Visit: Payer: Self-pay | Admitting: Orthopedic Surgery

## 2023-12-26 DIAGNOSIS — M5410 Radiculopathy, site unspecified: Secondary | ICD-10-CM

## 2023-12-26 DIAGNOSIS — G8929 Other chronic pain: Secondary | ICD-10-CM

## 2023-12-28 ENCOUNTER — Ambulatory Visit: Admitting: Orthopedic Surgery

## 2023-12-28 DIAGNOSIS — M5126 Other intervertebral disc displacement, lumbar region: Secondary | ICD-10-CM

## 2023-12-28 DIAGNOSIS — M5410 Radiculopathy, site unspecified: Secondary | ICD-10-CM

## 2023-12-28 NOTE — Progress Notes (Signed)
   There were no vitals taken for this visit.  There is no height or weight on file to calculate BMI.  Chief Complaint  Patient presents with   Back Pain    No diagnosis found.    Unchanged

## 2023-12-28 NOTE — Addendum Note (Signed)
 Addended by: VICENTA EMMIE HERO on: 12/28/2023 02:34 PM   Modules accepted: Orders

## 2023-12-28 NOTE — Progress Notes (Signed)
 FOLLOW-UP OFFICE VISIT   Patient: Teresa Russell           Date of Birth: 11-20-1970           MRN: [id number] Visit Date: 12/28/2023 Requested by: Tobie Suzzane POUR, MD [address] [place],  [place] [address] PCP: Tobie Suzzane POUR, MD    Encounter Diagnoses  Name Primary?   Radicular pain of left lower extremity    Protrusion of lumbar intervertebral disc Yes    Chief Complaint  Patient presents with   Back Pain    53 year old female followed for left leg radicular pain no back pain at present.  Patient has tried ibuprofen , prednisone , (causes weight gain) tramadol  which works for about 2 hours as well as gabapentin  which is increased from 100 to 300 mg and continues to have leg pain with standing.  She is concerned because she has to work and stand.  She can tolerate the pain except when she is standing at work  I have tried physical therapy as well I did the MRI which shows she has a protruding disc on the left  L3-L4: Annular disc bulge and mild bilateral facet hypertrophy. No canal stenosis. Mild bilateral foraminal stenosis.   L4-L5: No disc bulge. Mild bilateral facet arthropathy. No canal stenosis. Mild-to-moderate left foraminal stenosis.   L5-S1: Mild left paracentral disc protrusion. No foraminal or canal stenosis. Six   IMPRESSION: 1. Mild multilevel lumbar spondylosis. No canal stenosis at any level. 2. Mild bilateral foraminal stenosis at L3-L4 and mild-to-moderate left foraminal stenosis at L4-L5.  ASSESSMENT AND PLAN She was not happy with my recommendations.  She does not want surgery she did not really want to have an injection if possible.  She referred to the fact that hydrocodone  may help her.  However, opioids are not part of the treatment plan for disc problems and back pain  I recommended she see a spine surgeon and get an epidural injection  She was agreeable to that  And referrals were made

## 2024-01-08 ENCOUNTER — Telehealth: Payer: Self-pay

## 2024-01-08 DIAGNOSIS — F411 Generalized anxiety disorder: Secondary | ICD-10-CM

## 2024-01-08 MED ORDER — DIAZEPAM 5 MG PO TABS: ORAL_TABLET | ORAL | 0 refills | Status: AC

## 2024-01-08 NOTE — Addendum Note (Signed)
 Addended by: ELDONNA GARDINER POUR on: 01/08/2024 11:10 AM   Modules accepted: Orders

## 2024-01-08 NOTE — Telephone Encounter (Signed)
 Pre procedural medication requested.

## 2024-01-10 ENCOUNTER — Ambulatory Visit: Admitting: Family Medicine

## 2024-01-10 ENCOUNTER — Ambulatory Visit: Payer: Self-pay

## 2024-01-10 ENCOUNTER — Ambulatory Visit: Admitting: Internal Medicine

## 2024-01-10 ENCOUNTER — Encounter: Payer: Self-pay | Admitting: Family Medicine

## 2024-01-10 VITALS — BP 160/100 | HR 87 | Temp 98.3°F | Ht 65.5 in | Wt 210.2 lb

## 2024-01-10 DIAGNOSIS — M51362 Other intervertebral disc degeneration, lumbar region with discogenic back pain and lower extremity pain: Secondary | ICD-10-CM | POA: Diagnosis not present

## 2024-01-10 DIAGNOSIS — M5410 Radiculopathy, site unspecified: Secondary | ICD-10-CM | POA: Diagnosis not present

## 2024-01-10 MED ORDER — METHOCARBAMOL 750 MG PO TABS: 750.0000 mg | ORAL_TABLET | Freq: Three times a day (TID) | ORAL | 0 refills | Status: DC

## 2024-01-10 MED ORDER — PREDNISONE 10 MG (21) PO TBPK: ORAL_TABLET | ORAL | 0 refills | Status: DC

## 2024-01-10 NOTE — Telephone Encounter (Signed)
 FYI Only or Action Required?: FYI only for provider.  Patient was last seen in primary care on 10/25/2023 by Tobie Suzzane POUR, MD.  Called Nurse Triage reporting Leg Pain.  Symptoms began several months ago.  Interventions attempted: OTC medications: n/a and Prescription medications: gabapentin , celebrex , tramadol . Appointment with specialists in October  Symptoms are: unchanged.  Triage Disposition: See HCP Within 4 Hours (Or PCP Triage)  Patient/caregiver understands and will follow disposition?: Yes   Copied from CRM #[id number]. Topic: Clinical - Red Word Triage >> Jan 10, 2024  9:19 AM Carlatta H wrote: [facility] that prompted transfer to Nurse Triage: Patient is in severe leg pain//Patient has a bulging disk// Reason for Disposition  [1] SEVERE pain (e.g., excruciating, unable to do any normal activities) AND [2] not improved after 2 hours of pain medicine  Answer Assessment - Initial Assessment Questions 1. ONSET: When did the pain start?      About one year ago 2. LOCATION: Where is the pain located?      Leg pain 3. PAIN: How bad is the pain?    (Scale 1-10; or mild, moderate, severe)     10/10 4. WORK OR EXERCISE: Has there been any recent work or exercise that involved this part of the body?      denies 5. CAUSE: What do you think is causing the leg pain?     Has been told by specialist that she has bulging disc that is causing her leg pain 6. OTHER SYMPTOMS: Do you have any other symptoms? (e.g., chest pain, back pain, breathing difficulty, swelling, rash, fever, numbness, weakness)     N/A 7. PREGNANCY: Is there any chance you are pregnant? When was your last menstrual period?     N/A  Gabapentin , celebrex , and tramadol  are not helping Needs to work, up on feet for eight hour shifts, needs something to help with pain until she can see specialist in October  Protocols used: Leg Pain-A-AH

## 2024-01-10 NOTE — Progress Notes (Signed)
 Patient Office Visit  Assessment & Plan:  Radicular leg pain -     predniSONE ; Use as directed.  Dispense: 21 each; Refill: 0 -     Methocarbamol ; Take 1 tablet (750 mg total) by mouth 3 (three) times daily.  Dispense: 90 tablet; Refill: 0  Degeneration of intervertebral disc of lumbar region with discogenic back pain and lower extremity pain -     predniSONE ; Use as directed.  Dispense: 21 each; Refill: 0 -     Methocarbamol ; Take 1 tablet (750 mg total) by mouth 3 (three) times daily.  Dispense: 90 tablet; Refill: 0   Assessment and Plan    Lumbar radiculopathy with left lower extremity pain and lumbar intervertebral disc degeneration Chronic lumbar radiculopathy with severe pain due to lumbar intervertebral disc degeneration. Current medications ineffective. MRI shows bulging disc with nerve compression. Prednisone  provides short-term relief but causes weight gain. Considering Lyrica or Cymbalta for pain management. - Prescribed prednisone  for short-term pain relief. - Prescribed muscle relaxant for muscle tightness. - Discussed potential use of Lyrica or Cymbalta for pain management. - Consider increasing gabapentin  dosage if current regimen is ineffective. - Continue physical therapy exercises as instructed.     Patient will follow up with primary care physician if not improving. Patient aware she can increase her Gabapentin  dosage in the interim.     No follow-ups on file.   Subjective:    Patient ID: Teresa Russell, female    DOB: 1970/11/10  Age: 53 y.o. MRN: [id number]  Chief Complaint  Patient presents with   Leg Pain    Pt c/o of L leg pain due to a bulging disc in her back.     Leg Pain    Discussed the use of AI scribe software for clinical note transcription with the patient, who gave verbal consent to proceed.  History of Present Illness        Teresa Russell is a 53 year old female with a history of a bulging disc who presents with severe  left leg pain.  She experiences severe leg pain, described as feeling like her leg is 'trying to twist off.' The pain is primarily in the left leg and is rated as ten out of ten in severity. 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 The pain is somewhat alleviated when she is sitting or lying down.  She has a history of a bulging disc diagnosed via MRI, and was told by her specialist that she has radiculopathy. Despite taking gabapentin  300 mg three times a day, tramadol , Celebrex , and four Advil , these medications have not been effective in managing her pain. She has previously been on prednisone , which was effective but caused undesirable side effects. patient has not tried Lyrica or Cymbalta for the pain.   She is currently undergoing physical therapy and has an upcoming appointment for an epidural injection in October.  In terms of her social history, she works at the [employer] office, which requires her to stand for long periods, exacerbating her pain. She needs to continue working despite her pain due to financial obligations.  No burning sensation in her leg, but the pain may be coming from her buttocks/ back to her leg. She feels 'wiped out' due to the pain, which affects her mood. Physical Exam CHEST: Lungs clear to auscultation bilaterally. Results RADIOLOGY Lumbar spine MRI: Disc bulge with nerve involvement,  radiculopathy Assessment & Plan Lumbar radiculopathy with left lower extremity pain and lumbar intervertebral disc degeneration Chronic lumbar radiculopathy with severe pain due to lumbar intervertebral disc degeneration. Current medications ineffective. MRI shows bulging disc with nerve compression. Prednisone  provides short-term relief but causes weight gain. Considering Lyrica or Cymbalta for pain management. - Prescribed prednisone  for short-term pain relief. - Prescribed muscle relaxant for muscle  tightness. - Discussed potential use of Lyrica or Cymbalta for pain management. - Consider increasing gabapentin  dosage if current regimen is ineffective. - Continue physical therapy exercises as instructed.IMPRESSION: 1. Mild multilevel lumbar spondylosis. No canal stenosis at any level. 2. Mild bilateral foraminal stenosis at L3-L4 and mild-to-moderate left foraminal stenosis at L4-L5.     Electronically Signed   By: Mabel Converse D.O.   On: 11/07/2023 11:39      The 10-year ASCVD risk score (Arnett DK, et al., 2019) is: 18.9%  Past Medical History:  Diagnosis Date   Arthritis    wrists and right hip, right knee   Diabetes mellitus without complication (HCC)    GERD (gastroesophageal reflux disease)    Hyperlipidemia    Hypertension    Pneumonia    Sleep apnea    Past Surgical History:  Procedure Laterality Date   ABDOMINAL HYSTERECTOMY  2010   for heavy bleeding   BIOPSY  09/21/2021   Procedure: BIOPSY;  Surgeon: Cindie Carlin POUR, DO;  [facility]: [facility];  Service: Endoscopy;;   CARPAL TUNNEL RELEASE Right 11/08/2022   Procedure: CARPAL TUNNEL RELEASE;  Surgeon: Margrette Taft BRAVO, MD;  Location: [facility];  Service: Orthopedics;  Laterality: Right;   COLONOSCOPY N/A 10/14/2014   Procedure: COLONOSCOPY;  Surgeon: Margo LITTIE Haddock, MD;  Location: [facility];  Service: Endoscopy;  Laterality: N/A;  115 - moved to 11:15 - office notified pt   COLONOSCOPY WITH PROPOFOL  N/A 09/21/2021   Procedure: COLONOSCOPY WITH PROPOFOL ;  Surgeon: Cindie Carlin POUR, DO;  [facility]: [facility];  Service: Endoscopy;  Laterality: N/A;  9:30 / ASA 2   POLYPECTOMY  09/21/2021   Procedure: POLYPECTOMY;  Surgeon: Cindie Carlin POUR, DO;  [facility];  Service: Endoscopy;;   Social History   Tobacco Use   Smoking status: Former    Current packs/day: 0.00    Types: Cigarettes    Quit date: 05/02/2012    Years since quitting: 11.6   Smokeless tobacco: Never  Vaping Use    Vaping status: Never Used  Substance Use Topics   Alcohol use: Not Currently    Comment: Last drink 2012; used to drink a case of beer every Friday   Drug use: No   Family History  Problem Relation Age of Onset   Colon cancer Mother 83       passed away age 65   Breast cancer Maternal Aunt        currently treating   Allergies  Allergen Reactions   Wellbutrin [Bupropion] Other (See Comments)    Spinal pain    ROS    Objective:    BP (!) 160/100   Pulse 87   Temp 98.3 F (36.8 C)   Ht 5' 5.5 (1.664 m)   Wt 210 lb 4 oz (95.4 kg)   SpO2 98%   BMI 34.46 kg/m  BP Readings from Last 3 Encounters:  01/10/24 (!) 160/100  09/06/23 123/85  08/07/23 (!) 140/98   Wt Readings from Last 3 Encounters:  01/10/24 210 lb 4 oz (95.4 kg)  09/06/23 201 lb (91.2 kg)  08/07/23 206 lb (93.4 kg)    Physical Exam Vitals and nursing note reviewed.  Constitutional:      General: She is not in acute distress.    Appearance: Normal appearance.  HENT:     Head: Normocephalic.  Eyes:     Extraocular Movements: Extraocular movements intact.     Pupils: Pupils are equal, round, and reactive to light.  Cardiovascular:     Rate and Rhythm: Normal rate and regular rhythm.     Heart sounds: Normal heart sounds.  Pulmonary:     Effort: Pulmonary effort is normal.     Breath sounds: Normal breath sounds.  Musculoskeletal:     Lumbar back: Bony tenderness present. Decreased range of motion. Negative right straight leg raise test and negative left straight leg raise test.     Right lower leg: No edema.     Left lower leg: No edema.     Comments: Patient can get on exam table. Has tenderness over left buttock and left thigh. Patient has decrease ROM with forward flexion but can do this.   Neurological:     General: No focal deficit present.     Mental Status: She is alert and oriented to person, place, and time.  Psychiatric:        Mood and Affect: Mood normal.        Behavior: Behavior  normal.      No results found for any visits on 01/10/24.

## 2024-01-10 NOTE — Telephone Encounter (Signed)
 Patient scheduled.

## 2024-01-15 ENCOUNTER — Encounter: Payer: Self-pay | Admitting: Internal Medicine

## 2024-01-15 ENCOUNTER — Other Ambulatory Visit: Payer: Self-pay | Admitting: Internal Medicine

## 2024-01-15 DIAGNOSIS — K1379 Other lesions of oral mucosa: Secondary | ICD-10-CM

## 2024-01-15 MED ORDER — CHLORHEXIDINE GLUCONATE 0.12 % MT SOLN: OROMUCOSAL | 0 refills | Status: DC

## 2024-01-21 ENCOUNTER — Other Ambulatory Visit: Payer: Self-pay | Admitting: Internal Medicine

## 2024-01-21 DIAGNOSIS — I10 Essential (primary) hypertension: Secondary | ICD-10-CM

## 2024-01-21 DIAGNOSIS — E782 Mixed hyperlipidemia: Secondary | ICD-10-CM

## 2024-01-31 ENCOUNTER — Encounter: Payer: Self-pay | Admitting: Physical Medicine and Rehabilitation

## 2024-01-31 ENCOUNTER — Ambulatory Visit: Admitting: Physical Medicine and Rehabilitation

## 2024-01-31 ENCOUNTER — Other Ambulatory Visit: Payer: Self-pay

## 2024-01-31 VITALS — BP 105/71 | HR 102

## 2024-01-31 DIAGNOSIS — M48061 Spinal stenosis, lumbar region without neurogenic claudication: Secondary | ICD-10-CM

## 2024-01-31 DIAGNOSIS — M5416 Radiculopathy, lumbar region: Secondary | ICD-10-CM | POA: Diagnosis not present

## 2024-01-31 MED ORDER — METHYLPREDNISOLONE ACETATE 80 MG/ML IJ SUSP
40.0000 mg | Freq: Once | INTRAMUSCULAR | Status: AC
  Administered 2024-01-31: 40 mg

## 2024-01-31 NOTE — Progress Notes (Signed)
 Teresa Russell - 53 y.o. female MRN [id number]  Date of birth: 05/29/1970  Office Visit Note: Visit Date: 01/31/2024 PCP: Tobie Suzzane POUR, MD Referred by: Tobie Suzzane POUR, MD  Subjective: Chief Complaint  Patient presents with   Left Leg - Pain   HPI:  Teresa Russell is a 53 y.o. female who comes in today at the request of Dr. Taft Minerva for planned Left L4-5 Lumbar Transforaminal epidural steroid injection with fluoroscopic guidance.  The patient has failed conservative care including home exercise, medications, time and activity modification.  This injection will be diagnostic and hopefully therapeutic.  Please see requesting physician notes for further details and justification.  She is seeing Dr. Georgina in the next few days for consultation and I think she is also set up to see Dr. Claudene in [place] from a neurosurgical standpoint.  Her case is complicated by diabetes.    ROS Otherwise per HPI.  Assessment & Plan: Visit Diagnoses:    ICD-10-CM   1. Lumbar radiculopathy  M54.16 XR C-ARM NO REPORT    Epidural Steroid injection    methylPREDNISolone  acetate (DEPO-MEDROL ) injection 40 mg    2. Foraminal stenosis of lumbar region  M48.061       Plan: No additional findings.   Meds & Orders:  Meds ordered this encounter  Medications   methylPREDNISolone  acetate (DEPO-MEDROL ) injection 40 mg    Orders Placed This Encounter  Procedures   XR C-ARM NO REPORT   Epidural Steroid injection    Follow-up: Return for visit to requesting provider as needed.   Procedures: No procedures performed  Lumbosacral Transforaminal Epidural Steroid Injection - Sub-Pedicular Approach with Fluoroscopic Guidance  Patient: Teresa Russell      Date of Birth: 01-16-1971 MRN: [id number] PCP: Tobie Suzzane POUR, MD      Visit Date: 01/31/2024   Universal Protocol:    Date/Time: 01/31/2024  Consent Given By: the patient  Position: PRONE  Additional Comments: Vital signs were  monitored before and after the procedure. Patient was prepped and draped in the usual sterile fashion. The correct patient, procedure, and site was verified.   Injection Procedure Details:   Procedure diagnoses: Lumbar radiculopathy [M54.16]    Meds Administered:  Meds ordered this encounter  Medications   methylPREDNISolone  acetate (DEPO-MEDROL ) injection 40 mg    Laterality: Left  Location/Site: L4  Needle:5.0 in., 22 ga.  Short bevel or Quincke spinal needle  Needle Placement: Transforaminal  Findings:    -Comments: Excellent flow of contrast along the nerve, nerve root and into the epidural space.  Procedure Details: After squaring off the end-plates to get a true AP view, the C-arm was positioned so that an oblique view of the foramen as noted above was visualized. The target area is just inferior to the nose of the scotty dog or sub pedicular. The soft tissues overlying this structure were infiltrated with 2-3 ml. of 1% Lidocaine  without Epinephrine.  The spinal needle was inserted toward the target using a trajectory view along the fluoroscope beam.  Under AP and lateral visualization, the needle was advanced so it did not puncture dura and was located close the 6 O'Clock position of the pedical in AP tracterory. Biplanar projections were used to confirm position. Aspiration was confirmed to be negative for CSF and/or blood. A 1-2 ml. volume of Isovue-250 was injected and flow of contrast was noted at each level. Radiographs were obtained for documentation purposes.   After attaining the desired flow  of contrast documented above, a 0.5 to 1.0 ml test dose of 0.25% Marcaine was injected into each respective transforaminal space.  The patient was observed for 90 seconds post injection.  After no sensory deficits were reported, and normal lower extremity motor function was noted,   the above injectate was administered so that equal amounts of the injectate were placed at each  foramen (level) into the transforaminal epidural space.   Additional Comments:  The patient tolerated the procedure well Dressing: 2 x 2 sterile gauze and Band-Aid    Post-procedure details: Patient was observed during the procedure. Post-procedure instructions were reviewed.  Patient left the clinic in stable condition.    Clinical History: MRI LUMBAR SPINE WITHOUT CONTRAST   TECHNIQUE: Multiplanar, multisequence MR imaging of the lumbar spine was performed. No intravenous contrast was administered.   COMPARISON:  None Available.   FINDINGS: Segmentation:  Standard.   Alignment:  No significant listhesis.   Vertebrae:  No fracture, evidence of discitis, or bone lesion.   Conus medullaris and cauda equina: Conus extends to the L1 level. Conus and cauda equina appear normal.   Paraspinal and other soft tissues: Negative.   Disc levels:   T12-L1: Minimal annular disc bulge.  No foraminal or canal stenosis.   L1-L2: Unremarkable.   L2-L3: Unremarkable.   L3-L4: Annular disc bulge and mild bilateral facet hypertrophy. No canal stenosis. Mild bilateral foraminal stenosis.   L4-L5: No disc bulge. Mild bilateral facet arthropathy. No canal stenosis. Mild-to-moderate left foraminal stenosis.   L5-S1: Mild left paracentral disc protrusion. No foraminal or canal stenosis. Six   IMPRESSION: 1. Mild multilevel lumbar spondylosis. No canal stenosis at any level. 2. Mild bilateral foraminal stenosis at L3-L4 and mild-to-moderate left foraminal stenosis at L4-L5.     Electronically Signed   By: Mabel Converse D.O.   On: 11/07/2023 11:39     Objective:  VS:  HT:    WT:   BMI:     BP:105/71  HR:(!) 102bpm  TEMP: ( )  RESP:  Physical Exam Vitals and nursing note reviewed.  Constitutional:      General: She is not in acute distress.    Appearance: Normal appearance. She is not ill-appearing.  HENT:     Head: Normocephalic and atraumatic.     Right Ear:  External ear normal.     Left Ear: External ear normal.  Eyes:     Extraocular Movements: Extraocular movements intact.  Cardiovascular:     Rate and Rhythm: Normal rate.     Pulses: Normal pulses.  Pulmonary:     Effort: Pulmonary effort is normal. No respiratory distress.  Abdominal:     General: There is no distension.     Palpations: Abdomen is soft.  Musculoskeletal:        General: Tenderness present.     Cervical back: Neck supple.     Right lower leg: No edema.     Left lower leg: No edema.     Comments: Patient has good distal strength with no pain over the greater trochanters.  No clonus or focal weakness.  Skin:    Findings: No erythema, lesion or rash.  Neurological:     General: No focal deficit present.     Mental Status: She is alert and oriented to person, place, and time.     Sensory: No sensory deficit.     Motor: No weakness or abnormal muscle tone.     Coordination: Coordination normal.  Psychiatric:  Mood and Affect: Mood normal.        Behavior: Behavior normal.      Imaging: XR C-ARM NO REPORT Result Date: 01/31/2024 Please see Notes tab for imaging impression.

## 2024-01-31 NOTE — Progress Notes (Signed)
 Pain Scale   Average Pain 9   Patient here for injection. Pain in left leg.     +Driver, -BT, -Dye Allergies.

## 2024-01-31 NOTE — Procedures (Signed)
 Lumbosacral Transforaminal Epidural Steroid Injection - Sub-Pedicular Approach with Fluoroscopic Guidance  Patient: Teresa Russell      Date of Birth: [birth year] MRN: [id number] PCP: Tobie Suzzane POUR, MD      Visit Date: 01/31/2024   Universal Protocol:    Date/Time: 01/31/2024  Consent Given By: the patient  Position: PRONE  Additional Comments: Vital signs were monitored before and after the procedure. Patient was prepped and draped in the usual sterile fashion. The correct patient, procedure, and site was verified.   Injection Procedure Details:   Procedure diagnoses: Lumbar radiculopathy [M54.16]    Meds Administered:  Meds ordered this encounter  Medications   methylPREDNISolone  acetate (DEPO-MEDROL ) injection 40 mg    Laterality: Left  Location/Site: L4  Needle:5.0 in., 22 ga.  Short bevel or Quincke spinal needle  Needle Placement: Transforaminal  Findings:    -Comments: Excellent flow of contrast along the nerve, nerve root and into the epidural space.  Procedure Details: After squaring off the end-plates to get a true AP view, the C-arm was positioned so that an oblique view of the foramen as noted above was visualized. The target area is just inferior to the nose of the scotty dog or sub pedicular. The soft tissues overlying this structure were infiltrated with 2-3 ml. of 1% Lidocaine  without Epinephrine.  The spinal needle was inserted toward the target using a trajectory view along the fluoroscope beam.  Under AP and lateral visualization, the needle was advanced so it did not puncture dura and was located close the 6 O'Clock position of the pedical in AP tracterory. Biplanar projections were used to confirm position. Aspiration was confirmed to be negative for CSF and/or blood. A 1-2 ml. volume of Isovue-250 was injected and flow of contrast was noted at each level. Radiographs were obtained for documentation purposes.   After attaining the desired flow  of contrast documented above, a 0.5 to 1.0 ml test dose of 0.25% Marcaine was injected into each respective transforaminal space.  The patient was observed for 90 seconds post injection.  After no sensory deficits were reported, and normal lower extremity motor function was noted,   the above injectate was administered so that equal amounts of the injectate were placed at each foramen (level) into the transforaminal epidural space.   Additional Comments:  The patient tolerated the procedure well Dressing: 2 x 2 sterile gauze and Band-Aid    Post-procedure details: Patient was observed during the procedure. Post-procedure instructions were reviewed.  Patient left the clinic in stable condition.

## 2024-02-02 NOTE — Progress Notes (Deleted)
 Referring Physician:  Margrette Taft BRAVO, MD [address] [place],  [place] [address]  Primary Physician:  Tobie Suzzane POUR, MD  History of Present Illness: 02/02/2024 Ms. Lurline Spring is here today with a chief complaint of ***  Back pain radiating to left hip and down left leg to left foot Leg weakness or numbness?  Duration: *** Location: *** Quality: *** Severity: ***  Precipitating: aggravated by *** Modifying factors: made better by *** Weakness: none Timing: *** Bowel/Bladder Dysfunction: none  Conservative measures:  Physical therapy: *** had 1 visit with [facility] on 12/04/2023 and no showed 2 other appointments Multimodal medical therapy including regular antiinflammatories: *** Prednisone , Robaxin , Gabapentin , Ibuprofen , Tramadol , Celebrex  Injections: ***  01/31/2024- TFESI Left  L4   Past Surgery: ***no spine or neck surgery  Shamiah T Stennis has ***no symptoms of cervical myelopathy.  The symptoms are causing a significant impact on the patient's life.   I have utilized the care everywhere function in epic to review the outside records available from external health systems.  Review of Systems:  A 10 point review of systems is negative, except for the pertinent positives and negatives detailed in the HPI.  Past Medical History: Past Medical History:  Diagnosis Date   Arthritis    wrists and right hip, right knee   Diabetes mellitus without complication (HCC)    GERD (gastroesophageal reflux disease)    Hyperlipidemia    Hypertension    Pneumonia    Sleep apnea     Past Surgical History: Past Surgical History:  Procedure Laterality Date   ABDOMINAL HYSTERECTOMY  2010   for heavy bleeding   BIOPSY  09/21/2021   Procedure: BIOPSY;  Surgeon: Cindie Carlin POUR, DO;  [facility];  Service: Endoscopy;;   CARPAL TUNNEL RELEASE Right 11/08/2022   Procedure: CARPAL TUNNEL RELEASE;  Surgeon: Margrette Taft BRAVO, MD;  Location: [facility];   Service: Orthopedics;  Laterality: Right;   COLONOSCOPY N/A 10/14/2014   Procedure: COLONOSCOPY;  Surgeon: Margo LITTIE Haddock, MD;  [facility]: [facility];  Service: Endoscopy;  Laterality: N/A;  115 - moved to 11:15 - office notified pt   COLONOSCOPY WITH PROPOFOL  N/A 09/21/2021   Procedure: COLONOSCOPY WITH PROPOFOL ;  Surgeon: Cindie Carlin POUR, DO;  [facility];  Service: Endoscopy;  Laterality: N/A;  9:30 / ASA 2   POLYPECTOMY  09/21/2021   Procedure: POLYPECTOMY;  Surgeon: Cindie Carlin POUR, DO;  [facility];  Service: Endoscopy;;    Allergies: Allergies as of 02/07/2024 - Review Complete 01/10/2024  Allergen Reaction Noted   Wellbutrin [bupropion] Other (See Comments) 10/10/2014    Medications:  Current Outpatient Medications:    B Complex-C (VITAMIN B + C COMPLEX) TABS, Take by mouth daily at 6 (six) AM. Plus Zinc, Disp: , Rfl:    celecoxib  (CELEBREX ) 200 MG capsule, Take 1 capsule (200 mg total) by mouth 2 (two) times daily., Disp: 60 capsule, Rfl: 0   chlorhexidine  (PERIDEX ) 0.12 % solution, Mix 1 capful with water  to soak dentures overnight, Disp: 473 mL, Rfl: 0   clindamycin (CLEOCIN T) 1 % lotion, Apply topically., Disp: , Rfl:    diazepam  (VALIUM ) 5 MG tablet, Take 1 by mouth 1 hour  pre-procedure with very light food. Do not drive motor vehicle., Disp: 1 tablet, Rfl: 0   gabapentin  (NEURONTIN ) 100 MG capsule, TAKE 1 CAPSULE BY MOUTH THREE TIMES DAILY, Disp: 90 capsule, Rfl: 0   gabapentin  (NEURONTIN )  300 MG capsule, Take 1 capsule (300 mg total) by mouth 3 (three) times daily., Disp: 90 capsule, Rfl: 5   ipratropium (ATROVENT ) 0.03 % nasal spray, Place 2 sprays into the nose 2 (two) times daily., Disp: 30 mL, Rfl: 1   lisinopril -hydrochlorothiazide  (ZESTORETIC ) 10-12.5 MG tablet, Take 1 tablet by mouth once daily, Disp: 90 tablet, Rfl: 0   LYSINE ACETATE PO, Take by mouth daily at 6 (six) AM., Disp: , Rfl:    metFORMIN  (GLUCOPHAGE -XR) 500 MG 24 hr tablet,  Take 1 tablet by mouth once daily with breakfast, Disp: 90 tablet, Rfl: 0   methocarbamol  (ROBAXIN ) 750 MG tablet, Take 1 tablet (750 mg total) by mouth 3 (three) times daily., Disp: 90 tablet, Rfl: 0   nitrofurantoin, macrocrystal-monohydrate, (MACROBID) 100 MG capsule, Take 100 mg by mouth 2 (two) times daily., Disp: , Rfl:    rosuvastatin  (CRESTOR ) 10 MG tablet, Take 1 tablet by mouth once daily, Disp: 90 tablet, Rfl: 0   Simethicone  (PHAZYME PO), Take 1 capsule by mouth as needed., Disp: , Rfl:    tirzepatide  (MOUNJARO ) 15 MG/0.5ML Pen, Inject 15 mg into the skin once a week., Disp: 6 mL, Rfl: 1   traMADol  (ULTRAM ) 50 MG tablet, Take 1 tablet (50 mg total) by mouth every 6 (six) hours as needed., Disp: 60 tablet, Rfl: 5   VITAMIN D  PO, Take by mouth daily at 6 (six) AM., Disp: , Rfl:   Social History: Social History   Tobacco Use   Smoking status: Former    Current packs/day: 0.00    Types: Cigarettes    Quit date: 05/02/2012    Years since quitting: 11.7   Smokeless tobacco: Never  Vaping Use   Vaping status: Never Used  Substance Use Topics   Alcohol use: Not Currently    Comment: Last drink 2012; used to drink a case of beer every Friday   Drug use: No    Family Medical History: Family History  Problem Relation Age of Onset   Colon cancer Mother 7       passed away age 32   Breast cancer Maternal Aunt        currently treating    Physical Examination: There were no vitals filed for this visit.  General: Patient is in no apparent distress. Attention to examination is appropriate.  Neck:   Supple.  Full range of motion.  Respiratory: Patient is breathing without any difficulty.   NEUROLOGICAL:     Awake, alert, oriented to person, place, and time.  Speech is clear and fluent.   Cranial Nerves: Pupils equal round and reactive to light.  Facial tone is symmetric.  Facial sensation is symmetric. Shoulder shrug is symmetric. Tongue protrusion is midline.     Strength: Side Biceps Triceps Deltoid Interossei Grip Wrist Ext. Wrist Flex.  R 5 5 5 5 5 5 5   L 5 5 5 5 5 5 5    Side Iliopsoas Quads Hamstring PF DF EHL  R 5 5 5 5 5 5   L 5 5 5 5 5 5    Reflexes are ***2+ and symmetric at the biceps, triceps, brachioradialis, patella and achilles.   Hoffman's is absent. Clonus is absent  Bilateral upper and lower extremity sensation is intact to light touch ***.     No evidence of dysmetria noted.  Gait is normal.    Imaging: *** I have personally reviewed the images and agree with the above interpretation.  Medical Decision Making/Assessment and Plan: Ms.  Alviar is a pleasant 53 y.o. female with ***  There are no diagnoses linked to this encounter.   Thank you for involving me in the care of this patient.    Penne MICAEL Sharps MD/MSCR Neurosurgery

## 2024-02-05 ENCOUNTER — Other Ambulatory Visit (INDEPENDENT_AMBULATORY_CARE_PROVIDER_SITE_OTHER): Payer: Self-pay

## 2024-02-05 ENCOUNTER — Ambulatory Visit: Admitting: Orthopedic Surgery

## 2024-02-05 VITALS — BP 124/85 | HR 81 | Ht 65.5 in | Wt 210.0 lb

## 2024-02-05 DIAGNOSIS — M5416 Radiculopathy, lumbar region: Secondary | ICD-10-CM | POA: Diagnosis not present

## 2024-02-05 NOTE — Progress Notes (Signed)
 Orthopedic Spine Surgery Office Note  Assessment: Patient is a 53 y.o. female with left lateral thigh and posterior knee pain suspect an atypical L4 radiculopathy given the significant improvement with an L4 transforaminal injection   Plan: -Patient has tried PT, tylenol , NSAIDs, oral steroids, gabapentin , transforaminal steroid injection, tramadol   - Says she has noticed significant improvement with the transforaminal injection her.  Told her that she could get a repeat if this does give her lasting relief.  Otherwise her remaining options would be pain management and surgery -Patient should return to office on an as needed basis   Patient expressed understanding of the plan and all questions were answered to the patient's satisfaction.   ___________________________________________________________________________   History:  Patient is a 53 y.o. female who presents today for lumbar spine.  Patient has had about 6 months of left lower extremity pain.  She feels the pain along the lateral aspect of the thigh and the posterior aspect of the knee.  She does not have any pain rating past the knee.  There is no trauma or injury that preceded the onset of the pain.  She has no pain radiating into the left lower extremity.  She has tried multiple conservative treatments without relief, except she did recently get an injection with Dr. Eldonna to the L4 nerve.  They gave her about 80% relief.  She just got that injection a couple of days ago and is still noticing significant improvement in her pain.   Weakness: legs feel weaker at times Symptoms of imbalance: denies Paresthesias and numbness: denies Bowel or bladder incontinence: yes, stress incontinence (chronic with no recent changes in bowel or bladder habits Saddle anesthesia: denies  Treatments tried: PT, tylenol , NSAIDs, oral steroids, gabapentin , transforaminal steroid injection, tramadol   Review of systems: Denies fevers and chills,  night sweats, unexplained weight loss, history of cancer. Has had pain that wakes her at night  Past medical history: Anxiety HLD HTN OSA DM (last A1c was 7.4 on 09/06/2023)  Allergies: wellbutrin  Past surgical history:  Hysterectomy Polypectomy Carpal tunnel release  Social history: Denies use of nicotine product (smoking, vaping, patches, smokeless) Alcohol use: denies Denies recreational drug use   Physical Exam:  BMI of 34.4  General: no acute distress, appears stated age Neurologic: alert, answering questions appropriately, following commands Respiratory: unlabored breathing on room air, symmetric chest rise Psychiatric: appropriate affect, normal cadence to speech   MSK (spine):  -Strength exam      Left  Right EHL    5/5  5/5 TA    5/5  5/5 GSC    5/5  5/5 Knee extension  5/5  5/5 Hip flexion   5/5  5/5  -Sensory exam    Sensation intact to light touch in L3-S1 nerve distributions of bilateral lower extremities  Imaging: XRs of the lumbar spine from 02/05/2024 were independently reviewed and interpreted, showing no significant degenerative changes seen.  No evidence of instability on flexion/extension views.  No fracture or dislocation seen.  MRI of the lumbar spine from 11/06/2023 was independently reviewed and interpreted, showing moderate foraminal stenosis with a disc herniation at L4/5 on the left.  No other significant stenosis seen.   Patient name: Teresa Russell Patient MRN: [id number] Date of visit: 02/05/24

## 2024-02-07 ENCOUNTER — Ambulatory Visit: Admitting: Neurosurgery

## 2024-02-12 ENCOUNTER — Ambulatory Visit: Payer: Self-pay | Admitting: Internal Medicine

## 2024-02-12 ENCOUNTER — Encounter: Payer: Self-pay | Admitting: Internal Medicine

## 2024-02-12 VITALS — BP 121/87 | HR 78 | Ht 65.5 in | Wt 197.2 lb

## 2024-02-12 DIAGNOSIS — E1169 Type 2 diabetes mellitus with other specified complication: Secondary | ICD-10-CM

## 2024-02-12 DIAGNOSIS — D72820 Lymphocytosis (symptomatic): Secondary | ICD-10-CM | POA: Diagnosis not present

## 2024-02-12 DIAGNOSIS — E782 Mixed hyperlipidemia: Secondary | ICD-10-CM

## 2024-02-12 DIAGNOSIS — K429 Umbilical hernia without obstruction or gangrene: Secondary | ICD-10-CM | POA: Diagnosis not present

## 2024-02-12 DIAGNOSIS — I10 Essential (primary) hypertension: Secondary | ICD-10-CM

## 2024-02-12 DIAGNOSIS — L720 Epidermal cyst: Secondary | ICD-10-CM

## 2024-02-12 DIAGNOSIS — Z7985 Long-term (current) use of injectable non-insulin antidiabetic drugs: Secondary | ICD-10-CM

## 2024-02-12 DIAGNOSIS — Z7984 Long term (current) use of oral hypoglycemic drugs: Secondary | ICD-10-CM

## 2024-02-12 MED ORDER — SULFAMETHOXAZOLE-TRIMETHOPRIM 800-160 MG PO TABS: 1.0000 | ORAL_TABLET | Freq: Two times a day (BID) | ORAL | 0 refills | Status: DC

## 2024-02-12 NOTE — Progress Notes (Signed)
 Established Patient Office Visit  Subjective:  Patient ID: Teresa Russell, female    DOB: [birth year]  Age: 53 y.o. MRN: [id number]  CC:  Chief Complaint  Patient presents with   Medical Management of Chronic Issues    4 month f/u , abdominal hernia flare ups.    Skin Problem    Had a cyst drained on her back a few years ago, area is tender, would like to have area looked at.     HPI Teresa Russell is a 53 y.o. female with past medical history of HTN, DM, allergic rhinitis and obesity who presents for f/u of her chronic medical conditions.  HTN: Her BP has been well-controlled at home.  She denies any chest pain, dyspnea or palpitations currently.  Type II DM: Her HbA1c was 7.4 in 05/25. She is currently taking metformin  500 mg QD and Mounjaro  15 mg qw.  She has been able to lose 4 lbs weight with Mounjaro  since the last visit.  Denies any polyuria or polyphagia currently.  She also takes Crestor  for HLD.  She had orthopedic surgery evaluation for pain of left popliteal area, with bulging sensation since 02/15/23.  Her pain is intermittent, worse with extension and better with rest.  She has tried ibuprofen  with mild relief.  She had x-ray of knee, which was unremarkable.  She was given gabapentin  100 mg 3 times daily and prednisone  10 mg 3 times daily by orthopedic surgeon.  She has been evaluated by PM&R clinic for lumbar radiculopathy, had epidural steroid injection, which has improved her back pain and radicular symptoms.  She reports having a tender bulging on the mid back area for the last 2 months. Denies any active bleeding or discharge currently. She reports history of epidermal cyst in the past, which required I&D in 2021.  Past Medical History:  Diagnosis Date   Arthritis    wrists and right hip, right knee   Diabetes mellitus without complication (HCC)    GERD (gastroesophageal reflux disease)    Hyperlipidemia    Hypertension    Pneumonia    Sleep apnea     Past  Surgical History:  Procedure Laterality Date   ABDOMINAL HYSTERECTOMY  2010   for heavy bleeding   BIOPSY  09/21/2021   Procedure: BIOPSY;  Surgeon: Cindie Carlin POUR, DO;  [facility];  Service: Endoscopy;;   CARPAL TUNNEL RELEASE Right 11/08/2022   Procedure: CARPAL TUNNEL RELEASE;  Surgeon: Margrette Taft BRAVO, MD;  Location: [facility];  Service: Orthopedics;  Laterality: Right;   COLONOSCOPY N/A 10/14/2014   Procedure: COLONOSCOPY;  Surgeon: Margo LITTIE Haddock, MD;  [facility];  Service: Endoscopy;  Laterality: N/A;  115 - moved to 11:15 - office notified pt   COLONOSCOPY WITH PROPOFOL  N/A 09/21/2021   Procedure: COLONOSCOPY WITH PROPOFOL ;  Surgeon: Cindie Carlin POUR, DO;  [facility];  Service: Endoscopy;  Laterality: N/A;  9:30 / ASA 2   POLYPECTOMY  09/21/2021   Procedure: POLYPECTOMY;  Surgeon: Cindie Carlin POUR, DO;  [facility];  Service: Endoscopy;;    Family History  Problem Relation Age of Onset   Colon cancer Mother 59       passed away age 36   Breast cancer Maternal Aunt        currently treating    Social History   Socioeconomic History   Marital status: Single    Spouse name: Not on file   Number  of children: Not on file   Years of education: Not on file   Highest education level: Not on file  Occupational History   Not on file  Tobacco Use   Smoking status: Former    Current packs/day: 0.00    Types: Cigarettes    Quit date: 05/02/2012    Years since quitting: 11.7   Smokeless tobacco: Never  Vaping Use   Vaping status: Never Used  Substance and Sexual Activity   Alcohol use: Not Currently    Comment: Last drink 2012; used to drink a case of beer every Friday   Drug use: No   Sexual activity: Yes  Other Topics Concern   Not on file  Social History Narrative   Not on file   Social Drivers of Health   Financial Resource Strain: Not on file  Food Insecurity: Not on file  Transportation Needs: Not on file   Physical Activity: Not on file  Stress: Not on file  Social Connections: Not on file  Intimate Partner Violence: Not on file    Outpatient Medications Prior to Visit  Medication Sig Dispense Refill   B Complex-C (VITAMIN B + C COMPLEX) TABS Take by mouth daily at 6 (six) AM. Plus Zinc     celecoxib  (CELEBREX ) 200 MG capsule Take 1 capsule (200 mg total) by mouth 2 (two) times daily. 60 capsule 0   chlorhexidine  (PERIDEX ) 0.12 % solution Mix 1 capful with water  to soak dentures overnight 473 mL 0   clindamycin (CLEOCIN T) 1 % lotion Apply topically.     diazepam  (VALIUM ) 5 MG tablet Take 1 by mouth 1 hour  pre-procedure with very light food. Do not drive motor vehicle. 1 tablet 0   gabapentin  (NEURONTIN ) 100 MG capsule TAKE 1 CAPSULE BY MOUTH THREE TIMES DAILY 90 capsule 0   gabapentin  (NEURONTIN ) 300 MG capsule Take 1 capsule (300 mg total) by mouth 3 (three) times daily. 90 capsule 5   ipratropium (ATROVENT ) 0.03 % nasal spray Place 2 sprays into the nose 2 (two) times daily. 30 mL 1   lisinopril -hydrochlorothiazide  (ZESTORETIC ) 10-12.5 MG tablet Take 1 tablet by mouth once daily 90 tablet 0   LYSINE ACETATE PO Take by mouth daily at 6 (six) AM.     metFORMIN  (GLUCOPHAGE -XR) 500 MG 24 hr tablet Take 1 tablet by mouth once daily with breakfast 90 tablet 0   methocarbamol  (ROBAXIN ) 750 MG tablet Take 1 tablet (750 mg total) by mouth 3 (three) times daily. 90 tablet 0   rosuvastatin  (CRESTOR ) 10 MG tablet Take 1 tablet by mouth once daily 90 tablet 0   Simethicone  (PHAZYME PO) Take 1 capsule by mouth as needed.     tirzepatide  (MOUNJARO ) 15 MG/0.5ML Pen Inject 15 mg into the skin once a week. 6 mL 1   traMADol  (ULTRAM ) 50 MG tablet Take 1 tablet (50 mg total) by mouth every 6 (six) hours as needed. 60 tablet 5   VITAMIN D  PO Take by mouth daily at 6 (six) AM.     nitrofurantoin, macrocrystal-monohydrate, (MACROBID) 100 MG capsule Take 100 mg by mouth 2 (two) times daily.     No  facility-administered medications prior to visit.    Allergies  Allergen Reactions   Wellbutrin [Bupropion] Other (See Comments)    Spinal pain    ROS Review of Systems  Constitutional:  Positive for fatigue. Negative for chills and fever.  HENT:  Negative for congestion, postnasal drip, sinus pressure and sore throat.  Eyes:  Negative for pain and discharge.  Respiratory:  Negative for cough and shortness of breath.   Cardiovascular:  Negative for chest pain and palpitations.  Gastrointestinal:  Negative for diarrhea, nausea and vomiting.  Endocrine: Negative for polydipsia and polyuria.  Genitourinary:  Negative for dysuria, flank pain, hematuria, vaginal discharge and vaginal pain.  Musculoskeletal:  Negative for neck pain and neck stiffness.       Left knee and leg pain  Skin:  Negative for rash.       Cyst over upper back  Neurological:  Negative for dizziness.  Psychiatric/Behavioral:  Negative for agitation and behavioral problems.       Objective:    Physical Exam Vitals reviewed.  Constitutional:      General: She is not in acute distress.    Appearance: She is not diaphoretic.  HENT:     Head: Normocephalic and atraumatic.     Nose: Congestion present.     Comments: DNS    Mouth/Throat:     Mouth: Mucous membranes are dry.  Eyes:     General: No scleral icterus.    Extraocular Movements: Extraocular movements intact.  Cardiovascular:     Rate and Rhythm: Normal rate and regular rhythm.     Heart sounds: Normal heart sounds. No murmur heard. Pulmonary:     Breath sounds: Normal breath sounds. No wheezing or rales.  Abdominal:     General: Bowel sounds are normal.     Palpations: Abdomen is soft.     Hernia: A hernia (Umbilical) is present.  Musculoskeletal:     Cervical back: Neck supple. No tenderness.     Right lower leg: No edema.     Left lower leg: No edema.  Skin:    General: Skin is warm.     Findings: No rash.     Comments: Epidermoid cyst  over upper back, tender, about 4 cm in diameter, no discharge or bleeding noted Cyst-like structure over dorsum of left hand - about 2 cm in diameter Hyperpigmentation in axillary and neck area -likely acanthosis nigricans  Neurological:     General: No focal deficit present.     Mental Status: She is alert and oriented to person, place, and time.     Sensory: No sensory deficit.     Motor: No weakness.  Psychiatric:        Mood and Affect: Mood normal.        Behavior: Behavior normal.     BP 121/87   Pulse 78   Ht 5' 5.5 (1.664 m)   Wt 197 lb 3.2 oz (89.4 kg)   SpO2 98%   BMI 32.32 kg/m  Wt Readings from Last 3 Encounters:  02/12/24 197 lb 3.2 oz (89.4 kg)  02/05/24 210 lb (95.3 kg)  01/10/24 210 lb 4 oz (95.4 kg)    Lab Results  Component Value Date   TSH 3.140 09/06/2023   Lab Results  Component Value Date   WBC 8.5 09/06/2023   HGB 13.9 09/06/2023   HCT 41.4 09/06/2023   MCV 89 09/06/2023   PLT 251 09/06/2023   Lab Results  Component Value Date   NA 140 09/06/2023   K 3.6 09/06/2023   CO2 22 09/06/2023   GLUCOSE 159 (H) 09/06/2023   BUN 13 09/06/2023   CREATININE 0.60 09/06/2023   BILITOT 0.4 09/06/2023   ALKPHOS 64 09/06/2023   AST 46 (H) 09/06/2023   ALT 80 (H) 09/06/2023   PROT 6.7  09/06/2023   ALBUMIN 4.4 09/06/2023   CALCIUM  9.4 09/06/2023   ANIONGAP 10 11/02/2022   EGFR 109 09/06/2023   Lab Results  Component Value Date   CHOL 201 (H) 09/06/2023   Lab Results  Component Value Date   HDL 60 09/06/2023   Lab Results  Component Value Date   LDLCALC 84 09/06/2023   Lab Results  Component Value Date   TRIG 354 (H) 09/06/2023   Lab Results  Component Value Date   CHOLHDL 3.4 09/06/2023   Lab Results  Component Value Date   HGBA1C 7.4 (H) 09/06/2023      Assessment & Plan:   Problem List Items Addressed This Visit       Cardiovascular and Mediastinum   Hypertension   BP Readings from Last 1 Encounters:  02/12/24 121/87    Well-controlled with Lisinopril -hydrochlorothiazide  10-12.5 mg QD Counseled for compliance with the medications Advised DASH diet and moderate exercise/walking, at least 150 mins/week        Endocrine   Type 2 diabetes mellitus with other specified complication (HCC) - Primary   Lab Results  Component Value Date   HGBA1C 7.4 (H) 09/06/2023   Associated with HTN and HLD Well controlled On metformin  500 mg daily On Mounjaro  15 mg qw for type II DM and obesity Advised to follow diabetic diet On statin and ACEi F/u CMP and lipid panel Diabetic eye exam: Advised to follow up with Ophthalmology for diabetic eye exam      Relevant Orders   CMP14+EGFR   Hemoglobin A1c     Musculoskeletal and Integument   Epidermal cyst   Slight tenderness and increasing size of cyst over upper back Started Bactrim  Advised to perform warm compresses Referred to General surgery - also wants to discuss about umbilical hernia      Relevant Medications   sulfamethoxazole -trimethoprim  (BACTRIM  DS) 800-160 MG tablet   Other Relevant Orders   Ambulatory referral to General Surgery   CBC with Differential/Platelet     Other   Umbilical hernia without obstruction and without gangrene   Mostly benign, but complains of mild discomfort/pain at times Referred to General surgery      Relevant Orders   Ambulatory referral to General Surgery   Mixed hyperlipidemia   On Crestor  10 mg QD Check lipid profile      Lymphocytosis   Last CBC showed lymphocytosis, but total WBC count was WNL -could be due to recent infection or inflammation Recheck CBC      Relevant Orders   CBC with Differential/Platelet      Meds ordered this encounter  Medications   sulfamethoxazole -trimethoprim  (BACTRIM  DS) 800-160 MG tablet    Sig: Take 1 tablet by mouth 2 (two) times daily.    Dispense:  14 tablet    Refill:  0    Follow-up: Return in about 4 months (around 06/14/2024) for Annual physical.    Suzzane MARLA Blanch, MD

## 2024-02-12 NOTE — Assessment & Plan Note (Signed)
On Crestor 10 mg QD Check lipid profile 

## 2024-02-12 NOTE — Assessment & Plan Note (Signed)
 Slight tenderness and increasing size of cyst over upper back Started Bactrim  Advised to perform warm compresses Referred to General surgery - also wants to discuss about umbilical hernia

## 2024-02-12 NOTE — Patient Instructions (Signed)
 Please start taking Bactrim  as prescribed. Please apply warm compresses over cyst area.  Please continue to take medications as prescribed.  Please continue to follow low carb diet and perform moderate exercise/walking at least 150 mins/week.

## 2024-02-12 NOTE — Assessment & Plan Note (Addendum)
 Mostly benign, but complains of mild discomfort/pain at times Referred to General surgery

## 2024-02-12 NOTE — Assessment & Plan Note (Signed)
 Lab Results  Component Value Date   HGBA1C 7.4 (H) 09/06/2023   Associated with HTN and HLD Well controlled On metformin  500 mg daily On Mounjaro  15 mg qw for type II DM and obesity Advised to follow diabetic diet On statin and ACEi F/u CMP and lipid panel Diabetic eye exam: Advised to follow up with Ophthalmology for diabetic eye exam

## 2024-02-12 NOTE — Assessment & Plan Note (Signed)
 Last CBC showed lymphocytosis, but total WBC count was WNL -could be due to recent infection or inflammation Recheck CBC

## 2024-02-12 NOTE — Assessment & Plan Note (Addendum)
 BP Readings from Last 1 Encounters:  02/12/24 121/87   Well-controlled with Lisinopril -hydrochlorothiazide  10-12.5 mg QD Counseled for compliance with the medications Advised DASH diet and moderate exercise/walking, at least 150 mins/week

## 2024-02-13 ENCOUNTER — Ambulatory Visit: Payer: Self-pay | Admitting: Internal Medicine

## 2024-02-13 LAB — CMP14+EGFR
ALT: 44 IU/L — ABNORMAL HIGH (ref 0–32)
AST: 35 IU/L (ref 0–40)
Albumin: 5 g/dL — ABNORMAL HIGH (ref 3.8–4.9)
Alkaline Phosphatase: 69 IU/L (ref 49–135)
BUN/Creatinine Ratio: 16 (ref 9–23)
BUN: 13 mg/dL (ref 6–24)
Bilirubin Total: 0.5 mg/dL (ref 0.0–1.2)
CO2: 21 mmol/L (ref 20–29)
Calcium: 10.1 mg/dL (ref 8.7–10.2)
Chloride: 99 mmol/L (ref 96–106)
Creatinine, Ser: 0.82 mg/dL (ref 0.57–1.00)
Globulin, Total: 2.4 g/dL (ref 1.5–4.5)
Glucose: 107 mg/dL — ABNORMAL HIGH (ref 70–99)
Potassium: 3.6 mmol/L (ref 3.5–5.2)
Sodium: 139 mmol/L (ref 134–144)
Total Protein: 7.4 g/dL (ref 6.0–8.5)
eGFR: 86 mL/min/1.73 (ref >59)

## 2024-02-13 LAB — CBC WITH DIFFERENTIAL/PLATELET
Basophils Absolute: 0.1 x10E3/uL (ref 0.0–0.2)
Basos: 1 %
EOS (ABSOLUTE): 0.2 x10E3/uL (ref 0.0–0.4)
Eos: 3 %
Hematocrit: 42.1 % (ref 34.0–46.6)
Hemoglobin: 14.6 g/dL (ref 11.1–15.9)
Immature Grans (Abs): 0 x10E3/uL (ref 0.0–0.1)
Immature Granulocytes: 0 %
Lymphocytes Absolute: 3.6 x10E3/uL — ABNORMAL HIGH (ref 0.7–3.1)
Lymphs: 52 %
MCH: 29.8 pg (ref 26.6–33.0)
MCHC: 34.7 g/dL (ref 31.5–35.7)
MCV: 86 fL (ref 79–97)
Monocytes Absolute: 0.4 x10E3/uL (ref 0.1–0.9)
Monocytes: 6 %
Neutrophils Absolute: 2.7 x10E3/uL (ref 1.4–7.0)
Neutrophils: 38 %
Platelets: 320 x10E3/uL (ref 150–450)
RBC: 4.9 x10E6/uL (ref 3.77–5.28)
RDW: 12.5 % (ref 11.7–15.4)
WBC: 7 x10E3/uL (ref 3.4–10.8)

## 2024-02-13 LAB — HEMOGLOBIN A1C
Est. average glucose Bld gHb Est-mCnc: 140 mg/dL
Hgb A1c MFr Bld: 6.5 % — ABNORMAL HIGH (ref 4.8–5.6)

## 2024-02-27 ENCOUNTER — Encounter: Payer: Self-pay | Admitting: General Surgery

## 2024-02-27 ENCOUNTER — Ambulatory Visit: Admitting: General Surgery

## 2024-02-27 VITALS — BP 113/80 | HR 91 | Temp 97.9°F | Resp 14 | Ht 65.5 in | Wt 199.0 lb

## 2024-02-27 DIAGNOSIS — K429 Umbilical hernia without obstruction or gangrene: Secondary | ICD-10-CM | POA: Diagnosis not present

## 2024-02-27 DIAGNOSIS — L723 Sebaceous cyst: Secondary | ICD-10-CM

## 2024-02-27 NOTE — Progress Notes (Signed)
 Teresa Russell; [id number]; [birth year]   HPI Patient is a 53 year old black female who was referred to my care by Dr. Tobie for a recurrent sebaceous cyst on her upper back as well as for an umbilical hernia.  Patient states that she had a cyst removed in the past.  It has been present for some time now, but seems to be increasing in size.  No drainage has been noted.  It is only mildly tender to touch.  She also has an umbilical hernia that has been present for some time.  It sticks out when she is straining.  She has never had an episode of incarceration. Past Medical History:  Diagnosis Date   Arthritis    wrists and right hip, right knee   Diabetes mellitus without complication (HCC)    GERD (gastroesophageal reflux disease)    Hyperlipidemia    Hypertension    Pneumonia    Sleep apnea     Past Surgical History:  Procedure Laterality Date   ABDOMINAL HYSTERECTOMY  2010   for heavy bleeding   BIOPSY  09/21/2021   Procedure: BIOPSY;  Surgeon: Cindie Carlin POUR, DO;  [facility]: [facility];  Service: Endoscopy;;   CARPAL TUNNEL RELEASE Right 11/08/2022   Procedure: CARPAL TUNNEL RELEASE;  Surgeon: Margrette Taft BRAVO, MD;  Location: [facility];  Service: Orthopedics;  Laterality: Right;   COLONOSCOPY N/A 10/14/2014   Procedure: COLONOSCOPY;  Surgeon: Margo LITTIE Haddock, MD;  Location: [facility];  Service: Endoscopy;  Laterality: N/A;  115 - moved to 11:15 - office notified pt   COLONOSCOPY WITH PROPOFOL  N/A 09/21/2021   Procedure: COLONOSCOPY WITH PROPOFOL ;  Surgeon: Cindie Carlin POUR, DO;  Location: [facility];  Service: Endoscopy;  Laterality: N/A;  9:30 / ASA 2   POLYPECTOMY  09/21/2021   Procedure: POLYPECTOMY;  Surgeon: Cindie Carlin POUR, DO;  [facility]: [facility];  Service: Endoscopy;;    Family History  Problem Relation Age of Onset   Colon cancer Mother 57       passed away age 56   Breast cancer Maternal Aunt        currently treating    Current Outpatient Medications  on File Prior to Visit  Medication Sig Dispense Refill   B Complex-C (VITAMIN B + C COMPLEX) TABS Take by mouth daily at 6 (six) AM. Plus Zinc     celecoxib  (CELEBREX ) 200 MG capsule Take 1 capsule (200 mg total) by mouth 2 (two) times daily. 60 capsule 0   chlorhexidine  (PERIDEX ) 0.12 % solution Mix 1 capful with water  to soak dentures overnight 473 mL 0   clindamycin (CLEOCIN T) 1 % lotion Apply topically.     diazepam  (VALIUM ) 5 MG tablet Take 1 by mouth 1 hour  pre-procedure with very light food. Do not drive motor vehicle. 1 tablet 0   gabapentin  (NEURONTIN ) 100 MG capsule TAKE 1 CAPSULE BY MOUTH THREE TIMES DAILY 90 capsule 0   gabapentin  (NEURONTIN ) 300 MG capsule Take 1 capsule (300 mg total) by mouth 3 (three) times daily. 90 capsule 5   ipratropium (ATROVENT ) 0.03 % nasal spray Place 2 sprays into the nose 2 (two) times daily. 30 mL 1   lisinopril -hydrochlorothiazide  (ZESTORETIC ) 10-12.5 MG tablet Take 1 tablet by mouth once daily 90 tablet 0   LYSINE ACETATE PO Take by mouth daily at 6 (six) AM.     metFORMIN  (GLUCOPHAGE -XR) 500 MG 24 hr tablet Take 1 tablet by mouth once daily with  breakfast 90 tablet 0   methocarbamol  (ROBAXIN ) 750 MG tablet Take 1 tablet (750 mg total) by mouth 3 (three) times daily. 90 tablet 0   rosuvastatin  (CRESTOR ) 10 MG tablet Take 1 tablet by mouth once daily 90 tablet 0   Simethicone  (PHAZYME PO) Take 1 capsule by mouth as needed.     sulfamethoxazole -trimethoprim  (BACTRIM  DS) 800-160 MG tablet Take 1 tablet by mouth 2 (two) times daily. 14 tablet 0   tirzepatide  (MOUNJARO ) 15 MG/0.5ML Pen Inject 15 mg into the skin once a week. 6 mL 1   traMADol  (ULTRAM ) 50 MG tablet Take 1 tablet (50 mg total) by mouth every 6 (six) hours as needed. 60 tablet 5   VITAMIN D  PO Take by mouth daily at 6 (six) AM.     No current facility-administered medications on file prior to visit.    Allergies  Allergen Reactions   Wellbutrin [Bupropion] Other (See Comments)     Spinal pain    Social History   Substance and Sexual Activity  Alcohol Use Not Currently   Comment: Last drink 2012; used to drink a case of beer every Friday    Social History   Tobacco Use  Smoking Status Former   Current packs/day: 0.00   Types: Cigarettes   Quit date: 05/02/2012   Years since quitting: 11.8  Smokeless Tobacco Never    Review of Systems  Constitutional: Negative.   HENT:  Positive for sinus pain.   Eyes: Negative.   Respiratory: Negative.    Cardiovascular: Negative.   Gastrointestinal:  Positive for abdominal pain.  Genitourinary: Negative.   Musculoskeletal:  Positive for back pain.  Skin: Negative.   Neurological: Negative.   Endo/Heme/Allergies: Negative.   Psychiatric/Behavioral: Negative.      Objective   Vitals:   02/27/24 1538  BP: 113/80  Pulse: 91  Resp: 14  Temp: 97.9 F (36.6 C)  SpO2: 93%    Physical Exam Vitals reviewed.  Constitutional:      Appearance: Normal appearance. She is not ill-appearing.  HENT:     Head: Normocephalic and atraumatic.  Cardiovascular:     Rate and Rhythm: Normal rate and regular rhythm.     Heart sounds: Normal heart sounds. No murmur heard.    No friction rub. No gallop.  Pulmonary:     Effort: Pulmonary effort is normal. No respiratory distress.     Breath sounds: Normal breath sounds. No stridor. No wheezing, rhonchi or rales.  Abdominal:     General: Bowel sounds are normal. There is no distension.     Palpations: Abdomen is soft. There is no mass.     Tenderness: There is no abdominal tenderness. There is no guarding or rebound.     Hernia: A hernia is present.     Comments: A 3 cm reducible umbilical hernia is present.  Skin:    General: Skin is warm and dry.     Comments: A recurrent 2 cm sebaceous cyst is noted in the midline of the upper back.  No drainage is noted.  No erythema is noted.  Neurological:     Mental Status: She is alert and oriented to person, place, and time.      Assessment  Recurrent sebaceous cyst, back Umbilical hernia Plan  Patient does not want the umbilical hernia fixed at this time.  I did give her literature about umbilical hernias.  Concerning the recurrent sebaceous cyst, I told her that I would need to do this in  the operating room as an outpatient as it is recurrent and there is scar tissue present.  She would also like to wait and think about excision of the sebaceous cyst.  I told her to call me or return to my care should she have any questions or wants to proceed with surgery.

## 2024-03-04 ENCOUNTER — Encounter: Payer: Self-pay | Admitting: Radiology

## 2024-03-06 ENCOUNTER — Other Ambulatory Visit: Payer: Self-pay | Admitting: Internal Medicine

## 2024-03-06 DIAGNOSIS — L720 Epidermal cyst: Secondary | ICD-10-CM

## 2024-03-09 ENCOUNTER — Other Ambulatory Visit: Payer: Self-pay | Admitting: Internal Medicine

## 2024-03-09 DIAGNOSIS — M51362 Other intervertebral disc degeneration, lumbar region with discogenic back pain and lower extremity pain: Secondary | ICD-10-CM

## 2024-03-09 DIAGNOSIS — M5410 Radiculopathy, site unspecified: Secondary | ICD-10-CM

## 2024-04-09 ENCOUNTER — Other Ambulatory Visit: Payer: Self-pay | Admitting: Internal Medicine

## 2024-04-09 DIAGNOSIS — E1169 Type 2 diabetes mellitus with other specified complication: Secondary | ICD-10-CM

## 2024-04-17 DIAGNOSIS — Z6830 Body mass index (BMI) 30.0-30.9, adult: Secondary | ICD-10-CM | POA: Diagnosis not present

## 2024-04-17 DIAGNOSIS — E669 Obesity, unspecified: Secondary | ICD-10-CM | POA: Diagnosis not present

## 2024-04-17 DIAGNOSIS — K5792 Diverticulitis of intestine, part unspecified, without perforation or abscess without bleeding: Secondary | ICD-10-CM | POA: Diagnosis not present

## 2024-04-17 DIAGNOSIS — R1084 Generalized abdominal pain: Secondary | ICD-10-CM | POA: Diagnosis not present

## 2024-04-19 ENCOUNTER — Encounter: Payer: Self-pay | Admitting: Internal Medicine

## 2024-04-26 ENCOUNTER — Ambulatory Visit: Payer: Self-pay | Admitting: Internal Medicine

## 2024-04-26 ENCOUNTER — Encounter: Payer: Self-pay | Admitting: Internal Medicine

## 2024-04-26 VITALS — BP 123/81 | HR 77 | Ht 65.5 in | Wt 184.0 lb

## 2024-04-26 DIAGNOSIS — R1031 Right lower quadrant pain: Secondary | ICD-10-CM | POA: Diagnosis not present

## 2024-04-26 NOTE — Patient Instructions (Signed)
 Please get CT abdomen pelvis done as discussed.  Please continue to take medications as prescribed.  Please continue to follow low carb diet and perform moderate exercise/walking at least 150 mins/week.

## 2024-04-26 NOTE — Progress Notes (Signed)
 "  Acute Office Visit  Subjective:    Patient ID: Teresa Russell, female    DOB: [birth year], 53 y.o.   MRN: [id number]  Chief Complaint  Patient presents with   Follow-up    Follow up from urgent care, diagnosis patient with diverticulitis    HPI Patient is in today for follow up of recent urgent care visit for RLQ abdominal pain.  She had persistent RLQ abdominal pain for 1 week prior to urgent care visit on 04/18/24.  She had x-ray of abdomen, which did not show signs of obstruction.  She was given Augmentin  for possible colitis/diverticulitis.  She completed Augmentin  yesterday, and reports improvement in the abdominal pain currently but still has some discomfort.  Denies any nausea or vomiting currently.  Denies fever or chills now.  Past Medical History:  Diagnosis Date   Arthritis    wrists and right hip, right knee   Diabetes mellitus without complication (HCC)    GERD (gastroesophageal reflux disease)    Hyperlipidemia    Hypertension    Pneumonia    Sleep apnea     Past Surgical History:  Procedure Laterality Date   ABDOMINAL HYSTERECTOMY  2010   for heavy bleeding   BIOPSY  09/21/2021   Procedure: BIOPSY;  Surgeon: Cindie Carlin POUR, DO;  [facility]: [facility];  Service: Endoscopy;;   CARPAL TUNNEL RELEASE Right 11/08/2022   Procedure: CARPAL TUNNEL RELEASE;  Surgeon: Margrette Taft BRAVO, MD;  Location: [facility];  Service: Orthopedics;  Laterality: Right;   COLONOSCOPY N/A 10/14/2014   Procedure: COLONOSCOPY;  Surgeon: Margo LITTIE Haddock, MD;  [facility]: [facility];  Service: Endoscopy;  Laterality: N/A;  115 - moved to 11:15 - office notified pt   COLONOSCOPY WITH PROPOFOL  N/A 09/21/2021   Procedure: COLONOSCOPY WITH PROPOFOL ;  Surgeon: Cindie Carlin POUR, DO;  [facility];  Service: Endoscopy;  Laterality: N/A;  9:30 / ASA 2   POLYPECTOMY  09/21/2021   Procedure: POLYPECTOMY;  Surgeon: Cindie Carlin POUR, DO;  [facility]: [facility];  Service: Endoscopy;;     Family History  Problem Relation Age of Onset   Colon cancer Mother 62       passed away age 49   Breast cancer Maternal Aunt        currently treating    Social History   Socioeconomic History   Marital status: Single    Spouse name: Not on file   Number of children: Not on file   Years of education: Not on file   Highest education level: Not on file  Occupational History   Not on file  Tobacco Use   Smoking status: Former    Current packs/day: 0.00    Types: Cigarettes    Quit date: 05/02/2012    Years since quitting: 11.9   Smokeless tobacco: Never  Vaping Use   Vaping status: Never Used  Substance and Sexual Activity   Alcohol use: Not Currently    Comment: Last drink 2012; used to drink a case of beer every Friday   Drug use: No   Sexual activity: Yes  Other Topics Concern   Not on file  Social History Narrative   Not on file   Social Drivers of Health   Tobacco Use: Medium Risk (04/26/2024)   Patient History    Smoking Tobacco Use: Former    Smokeless Tobacco Use: Never    Passive Exposure: Not on Actuary Strain: Not on file  Food Insecurity: Not on file  Transportation Needs: Not on file  Physical Activity: Not on file  Stress: Not on file  Social Connections: Not on file  Intimate Partner Violence: Not on file  Depression (PHQ2-9): Low Risk (04/26/2024)   Depression (PHQ2-9)    PHQ-2 Score: 0  Alcohol Screen: Not on file  Housing: Not on file  Utilities: Not on file  Health Literacy: Not on file    Outpatient Medications Prior to Visit  Medication Sig Dispense Refill   B Complex-C (VITAMIN B + C COMPLEX) TABS Take by mouth daily at 6 (six) AM. Plus Zinc     celecoxib  (CELEBREX ) 200 MG capsule Take 1 capsule (200 mg total) by mouth 2 (two) times daily. 60 capsule 0   chlorhexidine  (PERIDEX ) 0.12 % solution Mix 1 capful with water  to soak dentures overnight 473 mL 0   clindamycin (CLEOCIN T) 1 % lotion Apply topically.      diazepam  (VALIUM ) 5 MG tablet Take 1 by mouth 1 hour  pre-procedure with very light food. Do not drive motor vehicle. 1 tablet 0   gabapentin  (NEURONTIN ) 100 MG capsule TAKE 1 CAPSULE BY MOUTH THREE TIMES DAILY 90 capsule 0   gabapentin  (NEURONTIN ) 300 MG capsule Take 1 capsule (300 mg total) by mouth 3 (three) times daily. 90 capsule 5   ipratropium (ATROVENT ) 0.03 % nasal spray Place 2 sprays into the nose 2 (two) times daily. 30 mL 1   lisinopril -hydrochlorothiazide  (ZESTORETIC ) 10-12.5 MG tablet Take 1 tablet by mouth once daily 90 tablet 0   LYSINE ACETATE PO Take by mouth daily at 6 (six) AM.     metFORMIN  (GLUCOPHAGE -XR) 500 MG 24 hr tablet Take 1 tablet by mouth once daily with breakfast 90 tablet 0   methocarbamol  (ROBAXIN ) 750 MG tablet TAKE 1 TABLET BY MOUTH THREE TIMES DAILY 90 tablet 1   rosuvastatin  (CRESTOR ) 10 MG tablet Take 1 tablet by mouth once daily 90 tablet 0   Simethicone  (PHAZYME PO) Take 1 capsule by mouth as needed.     tirzepatide  (MOUNJARO ) 15 MG/0.5ML Pen INJECT 15 MG INTO THE SKIN ONCE A WEEK 6 mL 3   traMADol  (ULTRAM ) 50 MG tablet Take 1 tablet (50 mg total) by mouth every 6 (six) hours as needed. 60 tablet 5   VITAMIN D  PO Take by mouth daily at 6 (six) AM.     sulfamethoxazole -trimethoprim  (BACTRIM  DS) 800-160 MG tablet Take 1 tablet by mouth twice daily 14 tablet 0   No facility-administered medications prior to visit.    Allergies[1]  Review of Systems  Constitutional:  Positive for fatigue. Negative for chills and fever.  HENT:  Negative for congestion, postnasal drip, sinus pressure and sore throat.   Eyes:  Negative for pain and discharge.  Respiratory:  Negative for cough and shortness of breath.   Cardiovascular:  Negative for chest pain and palpitations.  Gastrointestinal:  Positive for abdominal pain. Negative for diarrhea, nausea and vomiting.  Endocrine: Negative for polydipsia and polyuria.  Genitourinary:  Negative for dysuria, flank pain,  hematuria, vaginal discharge and vaginal pain.  Musculoskeletal:  Negative for neck pain and neck stiffness.       Left knee and leg pain  Skin:  Negative for rash.  Neurological:  Negative for dizziness.  Psychiatric/Behavioral:  Negative for agitation and behavioral problems.        Objective:    Physical Exam Vitals reviewed.  Constitutional:      General: She is not  in acute distress.    Appearance: She is not diaphoretic.  HENT:     Head: Normocephalic and atraumatic.     Nose: Congestion present.     Comments: DNS    Mouth/Throat:     Mouth: Mucous membranes are moist.  Eyes:     General: No scleral icterus.    Extraocular Movements: Extraocular movements intact.  Cardiovascular:     Rate and Rhythm: Normal rate and regular rhythm.     Heart sounds: Normal heart sounds. No murmur heard. Pulmonary:     Breath sounds: Normal breath sounds. No wheezing or rales.  Abdominal:     General: Bowel sounds are normal.     Palpations: Abdomen is soft.     Tenderness: There is abdominal tenderness (Mild, RLQ).     Hernia: A hernia (Umbilical) is present.  Musculoskeletal:     Cervical back: Neck supple. No tenderness.     Right lower leg: No edema.     Left lower leg: No edema.  Skin:    General: Skin is warm.     Findings: No rash.     Comments: Cyst-like structure over dorsum of left hand - about 2 cm in diameter Hyperpigmentation in axillary and neck area -likely acanthosis nigricans  Neurological:     General: No focal deficit present.     Mental Status: She is alert and oriented to person, place, and time.     Sensory: No sensory deficit.     Motor: No weakness.  Psychiatric:        Mood and Affect: Mood normal.        Behavior: Behavior normal.     BP 123/81   Pulse 77   Ht 5' 5.5 (1.664 m)   Wt 184 lb (83.5 kg)   SpO2 99%   BMI 30.15 kg/m  Wt Readings from Last 3 Encounters:  04/26/24 184 lb (83.5 kg)  02/27/24 199 lb (90.3 kg)  02/12/24 197 lb 3.2  oz (89.4 kg)        Assessment & Plan:   Problem List Items Addressed This Visit       Other   RLQ abdominal pain - Primary   Severe RLQ pain could be due to appendicitis vs colitis Overall improved with oral Augmentin  Check CT abdomen pelvis Advised to advance diet as tolerated Maintain at least 64 ounces of fluid intake in a day      Relevant Orders   CT ABDOMEN PELVIS WO CONTRAST     No orders of the defined types were placed in this encounter.    Suzzane MARLA Blanch, MD     [1]  Allergies Allergen Reactions   Wellbutrin [Bupropion] Other (See Comments)    Spinal pain   "

## 2024-04-26 NOTE — Assessment & Plan Note (Addendum)
 Severe RLQ pain could be due to appendicitis vs colitis Overall improved with oral Augmentin  Check CT abdomen pelvis Advised to advance diet as tolerated Maintain at least 64 ounces of fluid intake in a day

## 2024-05-03 ENCOUNTER — Other Ambulatory Visit (HOSPITAL_COMMUNITY): Payer: Self-pay

## 2024-05-03 ENCOUNTER — Ambulatory Visit (HOSPITAL_COMMUNITY)

## 2024-05-03 ENCOUNTER — Telehealth: Payer: Self-pay | Admitting: Pharmacy Technician

## 2024-05-03 NOTE — Telephone Encounter (Signed)
 Pharmacy Patient Advocate Encounter  Received notification from [facility] that Prior Authorization for Mounjaro  15MG /0.5ML auto-injectors  has been APPROVED from 05/03/2024 to 05/03/2025   PA #/Case ID/Reference #: [id number]

## 2024-05-03 NOTE — Telephone Encounter (Signed)
 Pharmacy Patient Advocate Encounter   Received notification from Onbase that prior authorization for Mounjaro  15MG /0.5ML auto-injectors is due for renewal.   Insurance verification completed.   The patient is insured through [employer].  Action: PA required; PA started via CoverMyMeds. KEY [id number] . Waiting for clinical questions to populate.

## 2024-05-03 NOTE — Telephone Encounter (Signed)
 Clinical questions and answers have been submitted.

## 2024-05-06 ENCOUNTER — Other Ambulatory Visit: Payer: Self-pay | Admitting: Internal Medicine

## 2024-05-06 DIAGNOSIS — K1379 Other lesions of oral mucosa: Secondary | ICD-10-CM

## 2024-05-06 DIAGNOSIS — E782 Mixed hyperlipidemia: Secondary | ICD-10-CM

## 2024-05-06 DIAGNOSIS — I10 Essential (primary) hypertension: Secondary | ICD-10-CM

## 2024-05-20 ENCOUNTER — Telehealth: Payer: Self-pay | Admitting: Internal Medicine

## 2024-05-20 NOTE — Telephone Encounter (Signed)
 Fmla  Noted Sleeved Original in providers box Copy up front

## 2024-05-21 ENCOUNTER — Telehealth: Payer: Self-pay

## 2024-05-21 NOTE — Telephone Encounter (Signed)
 Spoke to pt regarding clarification for fmla forms, form completed will have Dr. Antonetta sign off on the form. Will call the pt when it is ready for pick up.

## 2024-05-21 NOTE — Telephone Encounter (Signed)
 Copied from CRM [id number]. Topic: General - Call Back - No Documentation >> May 21, 2024 12:56 PM Teresa Russell wrote: Reason for CRM: Pt called in regarding missed call. Only note is yesterday regarding FMLA paperwork.    Attempted to call [employer] to verify if ready. No answer.   Please follow up with pt

## 2024-05-31 ENCOUNTER — Other Ambulatory Visit: Payer: Self-pay | Admitting: Internal Medicine

## 2024-06-04 ENCOUNTER — Encounter: Payer: Self-pay | Admitting: Internal Medicine

## 2024-06-12 ENCOUNTER — Ambulatory Visit: Admitting: Internal Medicine
